# Patient Record
Sex: Female | Born: 1935 | Race: White | Hispanic: No | Marital: Single | Smoking: Never smoker
Health system: Southern US, Community
[De-identification: ages and names within clinical notes are randomized; demographics above are authoritative.]

## PROBLEM LIST (undated history)

## (undated) DIAGNOSIS — R05 Cough: Secondary | ICD-10-CM

## (undated) DIAGNOSIS — Z8489 Family history of other specified conditions: Secondary | ICD-10-CM

## (undated) DIAGNOSIS — M1990 Unspecified osteoarthritis, unspecified site: Secondary | ICD-10-CM

## (undated) DIAGNOSIS — R739 Hyperglycemia, unspecified: Secondary | ICD-10-CM

## (undated) DIAGNOSIS — R319 Hematuria, unspecified: Secondary | ICD-10-CM

## (undated) DIAGNOSIS — IMO0002 Reserved for concepts with insufficient information to code with codable children: Secondary | ICD-10-CM

## (undated) DIAGNOSIS — K219 Gastro-esophageal reflux disease without esophagitis: Secondary | ICD-10-CM

## (undated) DIAGNOSIS — K5790 Diverticulosis of intestine, part unspecified, without perforation or abscess without bleeding: Secondary | ICD-10-CM

## (undated) DIAGNOSIS — C541 Malignant neoplasm of endometrium: Secondary | ICD-10-CM

## (undated) DIAGNOSIS — I10 Essential (primary) hypertension: Secondary | ICD-10-CM

## (undated) DIAGNOSIS — R32 Unspecified urinary incontinence: Secondary | ICD-10-CM

## (undated) DIAGNOSIS — C55 Malignant neoplasm of uterus, part unspecified: Secondary | ICD-10-CM

## (undated) DIAGNOSIS — C44519 Basal cell carcinoma of skin of other part of trunk: Secondary | ICD-10-CM

## (undated) DIAGNOSIS — R053 Chronic cough: Secondary | ICD-10-CM

## (undated) DIAGNOSIS — C50912 Malignant neoplasm of unspecified site of left female breast: Secondary | ICD-10-CM

## (undated) HISTORY — DX: Malignant neoplasm of endometrium: C54.1

## (undated) HISTORY — DX: Chronic cough: R05.3

## (undated) HISTORY — PX: TOTAL ABDOMINAL HYSTERECTOMY: SHX209

## (undated) HISTORY — PX: DILATION AND CURETTAGE OF UTERUS: SHX78

## (undated) HISTORY — DX: Cough: R05

## (undated) HISTORY — PX: CATARACT EXTRACTION W/ INTRAOCULAR LENS  IMPLANT, BILATERAL: SHX1307

## (undated) HISTORY — PX: CARPAL TUNNEL RELEASE: SHX101

## (undated) HISTORY — DX: Hyperglycemia, unspecified: R73.9

## (undated) HISTORY — DX: Reserved for concepts with insufficient information to code with codable children: IMO0002

## (undated) HISTORY — DX: Gastro-esophageal reflux disease without esophagitis: K21.9

## (undated) HISTORY — PX: TOTAL ABDOMINAL HYSTERECTOMY W/ BILATERAL SALPINGOOPHORECTOMY: SHX83

## (undated) HISTORY — PX: MASTECTOMY: SHX3

## (undated) HISTORY — DX: Hematuria, unspecified: R31.9

## (undated) HISTORY — PX: SHOULDER SURGERY: SHX246

## (undated) HISTORY — DX: Diverticulosis of intestine, part unspecified, without perforation or abscess without bleeding: K57.90

## (undated) HISTORY — DX: Essential (primary) hypertension: I10

---

## 1998-04-14 ENCOUNTER — Ambulatory Visit (HOSPITAL_COMMUNITY): Admission: RE | Admit: 1998-04-14 | Discharge: 1998-04-14 | Payer: Self-pay | Admitting: Gastroenterology

## 1998-04-30 ENCOUNTER — Ambulatory Visit (HOSPITAL_COMMUNITY): Admission: RE | Admit: 1998-04-30 | Discharge: 1998-04-30 | Payer: Self-pay | Admitting: Gastroenterology

## 1998-08-13 DIAGNOSIS — C50912 Malignant neoplasm of unspecified site of left female breast: Secondary | ICD-10-CM

## 1998-08-13 HISTORY — DX: Malignant neoplasm of unspecified site of left female breast: C50.912

## 1998-08-22 ENCOUNTER — Ambulatory Visit (HOSPITAL_COMMUNITY): Admission: RE | Admit: 1998-08-22 | Discharge: 1998-08-22 | Payer: Self-pay | Admitting: *Deleted

## 1998-09-02 ENCOUNTER — Inpatient Hospital Stay (HOSPITAL_COMMUNITY): Admission: RE | Admit: 1998-09-02 | Discharge: 1998-09-03 | Payer: Self-pay | Admitting: *Deleted

## 1998-10-13 ENCOUNTER — Other Ambulatory Visit: Admission: RE | Admit: 1998-10-13 | Discharge: 1998-10-13 | Payer: Self-pay | Admitting: Obstetrics and Gynecology

## 1998-10-14 HISTORY — PX: OTHER SURGICAL HISTORY: SHX169

## 1999-10-15 ENCOUNTER — Encounter: Payer: Self-pay | Admitting: Family Medicine

## 1999-10-15 ENCOUNTER — Other Ambulatory Visit: Admission: RE | Admit: 1999-10-15 | Discharge: 1999-10-15 | Payer: Self-pay | Admitting: Obstetrics and Gynecology

## 1999-10-15 LAB — CONVERTED CEMR LAB: Pap Smear: NORMAL

## 1999-10-15 LAB — FECAL OCCULT BLOOD, GUAIAC: Fecal Occult Blood: NEGATIVE

## 2000-03-29 ENCOUNTER — Encounter: Payer: Self-pay | Admitting: Family Medicine

## 2000-03-29 ENCOUNTER — Encounter: Admission: RE | Admit: 2000-03-29 | Discharge: 2000-03-29 | Payer: Self-pay | Admitting: Family Medicine

## 2000-10-14 HISTORY — PX: OTHER SURGICAL HISTORY: SHX169

## 2000-10-25 ENCOUNTER — Other Ambulatory Visit: Admission: RE | Admit: 2000-10-25 | Discharge: 2000-10-25 | Payer: Self-pay | Admitting: Obstetrics and Gynecology

## 2000-11-11 HISTORY — PX: COLONOSCOPY: SHX174

## 2001-10-31 ENCOUNTER — Other Ambulatory Visit: Admission: RE | Admit: 2001-10-31 | Discharge: 2001-10-31 | Payer: Self-pay | Admitting: Obstetrics and Gynecology

## 2002-11-01 ENCOUNTER — Other Ambulatory Visit: Admission: RE | Admit: 2002-11-01 | Discharge: 2002-11-01 | Payer: Self-pay | Admitting: Obstetrics and Gynecology

## 2002-11-12 HISTORY — PX: OTHER SURGICAL HISTORY: SHX169

## 2002-12-02 ENCOUNTER — Encounter: Payer: Self-pay | Admitting: Emergency Medicine

## 2002-12-02 ENCOUNTER — Emergency Department (HOSPITAL_COMMUNITY): Admission: EM | Admit: 2002-12-02 | Discharge: 2002-12-02 | Payer: Self-pay | Admitting: Emergency Medicine

## 2003-11-07 ENCOUNTER — Other Ambulatory Visit: Admission: RE | Admit: 2003-11-07 | Discharge: 2003-11-07 | Payer: Self-pay | Admitting: Obstetrics and Gynecology

## 2004-08-20 ENCOUNTER — Emergency Department (HOSPITAL_COMMUNITY): Admission: EM | Admit: 2004-08-20 | Discharge: 2004-08-20 | Payer: Self-pay | Admitting: Emergency Medicine

## 2004-08-24 ENCOUNTER — Ambulatory Visit: Payer: Self-pay | Admitting: Family Medicine

## 2004-09-11 ENCOUNTER — Ambulatory Visit: Payer: Self-pay | Admitting: Family Medicine

## 2004-09-29 ENCOUNTER — Emergency Department (HOSPITAL_COMMUNITY): Admission: EM | Admit: 2004-09-29 | Discharge: 2004-09-29 | Payer: Self-pay | Admitting: *Deleted

## 2004-09-29 ENCOUNTER — Ambulatory Visit: Payer: Self-pay | Admitting: Family Medicine

## 2004-10-01 ENCOUNTER — Ambulatory Visit: Payer: Self-pay | Admitting: Family Medicine

## 2004-10-05 ENCOUNTER — Ambulatory Visit: Payer: Self-pay | Admitting: Internal Medicine

## 2004-10-07 ENCOUNTER — Ambulatory Visit: Payer: Self-pay | Admitting: Family Medicine

## 2004-10-14 ENCOUNTER — Ambulatory Visit: Payer: Self-pay | Admitting: Family Medicine

## 2004-10-16 ENCOUNTER — Ambulatory Visit: Payer: Self-pay | Admitting: Internal Medicine

## 2004-10-28 ENCOUNTER — Ambulatory Visit: Payer: Self-pay | Admitting: Oncology

## 2004-11-02 ENCOUNTER — Ambulatory Visit: Payer: Self-pay | Admitting: Family Medicine

## 2004-11-09 ENCOUNTER — Other Ambulatory Visit: Admission: RE | Admit: 2004-11-09 | Discharge: 2004-11-09 | Payer: Self-pay | Admitting: *Deleted

## 2004-11-11 ENCOUNTER — Ambulatory Visit: Payer: Self-pay | Admitting: Family Medicine

## 2004-11-20 ENCOUNTER — Ambulatory Visit: Payer: Self-pay | Admitting: Internal Medicine

## 2004-12-09 ENCOUNTER — Ambulatory Visit: Payer: Self-pay | Admitting: Family Medicine

## 2004-12-28 ENCOUNTER — Ambulatory Visit: Payer: Self-pay | Admitting: Family Medicine

## 2005-01-11 ENCOUNTER — Ambulatory Visit: Payer: Self-pay | Admitting: Family Medicine

## 2005-03-03 ENCOUNTER — Ambulatory Visit: Payer: Self-pay | Admitting: Family Medicine

## 2005-04-12 ENCOUNTER — Ambulatory Visit: Payer: Self-pay | Admitting: Family Medicine

## 2005-05-26 ENCOUNTER — Ambulatory Visit: Payer: Self-pay | Admitting: Family Medicine

## 2005-06-30 ENCOUNTER — Ambulatory Visit: Payer: Self-pay | Admitting: Family Medicine

## 2005-09-13 HISTORY — PX: BACK SURGERY: SHX140

## 2005-09-22 ENCOUNTER — Ambulatory Visit: Payer: Self-pay | Admitting: Family Medicine

## 2005-09-28 ENCOUNTER — Encounter: Admission: RE | Admit: 2005-09-28 | Discharge: 2005-09-28 | Payer: Self-pay | Admitting: Family Medicine

## 2005-11-05 ENCOUNTER — Ambulatory Visit: Payer: Self-pay | Admitting: Oncology

## 2005-11-15 ENCOUNTER — Ambulatory Visit: Payer: Self-pay | Admitting: Family Medicine

## 2005-12-09 ENCOUNTER — Other Ambulatory Visit: Admission: RE | Admit: 2005-12-09 | Discharge: 2005-12-09 | Payer: Self-pay | Admitting: Obstetrics & Gynecology

## 2005-12-29 ENCOUNTER — Emergency Department (HOSPITAL_COMMUNITY): Admission: EM | Admit: 2005-12-29 | Discharge: 2005-12-30 | Payer: Self-pay | Admitting: Emergency Medicine

## 2005-12-31 ENCOUNTER — Ambulatory Visit: Payer: Self-pay | Admitting: Family Medicine

## 2006-01-21 ENCOUNTER — Ambulatory Visit: Payer: Self-pay | Admitting: Family Medicine

## 2006-03-31 ENCOUNTER — Ambulatory Visit: Payer: Self-pay | Admitting: Family Medicine

## 2006-05-01 ENCOUNTER — Encounter: Admission: RE | Admit: 2006-05-01 | Discharge: 2006-05-01 | Payer: Self-pay | Admitting: Family Medicine

## 2006-05-09 ENCOUNTER — Ambulatory Visit (HOSPITAL_COMMUNITY): Admission: RE | Admit: 2006-05-09 | Discharge: 2006-05-10 | Payer: Self-pay | Admitting: Orthopaedic Surgery

## 2006-06-22 ENCOUNTER — Ambulatory Visit: Payer: Self-pay | Admitting: Family Medicine

## 2006-07-07 ENCOUNTER — Ambulatory Visit: Payer: Self-pay | Admitting: Family Medicine

## 2006-07-26 ENCOUNTER — Ambulatory Visit: Payer: Self-pay | Admitting: Family Medicine

## 2006-09-27 ENCOUNTER — Ambulatory Visit: Payer: Self-pay | Admitting: Family Medicine

## 2006-10-12 ENCOUNTER — Ambulatory Visit: Payer: Self-pay | Admitting: Pulmonary Disease

## 2006-10-26 ENCOUNTER — Ambulatory Visit: Payer: Self-pay | Admitting: Family Medicine

## 2006-11-08 ENCOUNTER — Emergency Department (HOSPITAL_COMMUNITY): Admission: EM | Admit: 2006-11-08 | Discharge: 2006-11-08 | Payer: Self-pay | Admitting: Emergency Medicine

## 2006-11-09 ENCOUNTER — Ambulatory Visit: Payer: Self-pay | Admitting: Oncology

## 2006-11-14 LAB — COMPREHENSIVE METABOLIC PANEL
ALT: 15 U/L (ref 0–35)
Alkaline Phosphatase: 52 U/L (ref 39–117)
Potassium: 3.8 mEq/L (ref 3.5–5.3)
Sodium: 140 mEq/L (ref 135–145)
Total Bilirubin: 0.4 mg/dL (ref 0.3–1.2)
Total Protein: 7 g/dL (ref 6.0–8.3)

## 2006-11-14 LAB — CBC WITH DIFFERENTIAL/PLATELET
BASO%: 0.1 % (ref 0.0–2.0)
LYMPH%: 19.6 % (ref 14.0–48.0)
MCHC: 34.1 g/dL (ref 32.0–36.0)
MCV: 91.2 fL (ref 81.0–101.0)
MONO#: 1.1 10*3/uL — ABNORMAL HIGH (ref 0.1–0.9)
MONO%: 15.3 % — ABNORMAL HIGH (ref 0.0–13.0)
Platelets: 264 10*3/uL (ref 145–400)
RBC: 4.08 10*6/uL (ref 3.70–5.32)
RDW: 13.6 % (ref 11.3–14.5)
WBC: 7.2 10*3/uL (ref 3.9–10.0)

## 2006-11-18 ENCOUNTER — Ambulatory Visit: Payer: Self-pay | Admitting: Family Medicine

## 2006-11-22 ENCOUNTER — Ambulatory Visit (HOSPITAL_BASED_OUTPATIENT_CLINIC_OR_DEPARTMENT_OTHER): Admission: RE | Admit: 2006-11-22 | Discharge: 2006-11-22 | Payer: Self-pay | Admitting: Pulmonary Disease

## 2006-11-29 ENCOUNTER — Encounter: Payer: Self-pay | Admitting: Family Medicine

## 2006-11-29 DIAGNOSIS — K219 Gastro-esophageal reflux disease without esophagitis: Secondary | ICD-10-CM | POA: Insufficient documentation

## 2006-11-29 DIAGNOSIS — I10 Essential (primary) hypertension: Secondary | ICD-10-CM | POA: Insufficient documentation

## 2006-12-09 ENCOUNTER — Ambulatory Visit: Payer: Self-pay | Admitting: Pulmonary Disease

## 2006-12-11 DIAGNOSIS — Z853 Personal history of malignant neoplasm of breast: Secondary | ICD-10-CM | POA: Insufficient documentation

## 2006-12-14 ENCOUNTER — Other Ambulatory Visit: Admission: RE | Admit: 2006-12-14 | Discharge: 2006-12-14 | Payer: Self-pay | Admitting: Obstetrics & Gynecology

## 2006-12-15 DIAGNOSIS — M1990 Unspecified osteoarthritis, unspecified site: Secondary | ICD-10-CM | POA: Insufficient documentation

## 2006-12-21 ENCOUNTER — Ambulatory Visit: Payer: Self-pay | Admitting: Pulmonary Disease

## 2006-12-28 ENCOUNTER — Emergency Department (HOSPITAL_COMMUNITY): Admission: EM | Admit: 2006-12-28 | Discharge: 2006-12-29 | Payer: Self-pay | Admitting: Emergency Medicine

## 2007-01-18 ENCOUNTER — Ambulatory Visit: Payer: Self-pay | Admitting: Pulmonary Disease

## 2007-05-05 ENCOUNTER — Ambulatory Visit: Payer: Self-pay | Admitting: Family Medicine

## 2007-05-05 DIAGNOSIS — G2581 Restless legs syndrome: Secondary | ICD-10-CM

## 2007-05-05 DIAGNOSIS — L821 Other seborrheic keratosis: Secondary | ICD-10-CM | POA: Insufficient documentation

## 2007-05-08 LAB — CONVERTED CEMR LAB
Albumin: 4.4 g/dL (ref 3.5–5.2)
Basophils Relative: 0 % (ref 0–1)
Eosinophils Absolute: 0.1 10*3/uL (ref 0.0–0.7)
Eosinophils Relative: 2 % (ref 0–5)
Ferritin: 86 ng/mL (ref 10–291)
Hemoglobin: 13.4 g/dL (ref 12.0–15.0)
Lymphs Abs: 1.3 10*3/uL (ref 0.7–3.3)
MCV: 92.4 fL (ref 78.0–100.0)
Monocytes Absolute: 0.9 10*3/uL — ABNORMAL HIGH (ref 0.2–0.7)
Phosphorus: 4.2 mg/dL (ref 2.3–4.6)
Potassium: 3.9 meq/L (ref 3.5–5.3)
RBC: 4.33 M/uL (ref 3.87–5.11)
RDW: 13.8 % (ref 11.5–14.0)
Sodium: 140 meq/L (ref 135–145)
TSH: 1.94 microintl units/mL (ref 0.350–5.50)
WBC: 5.5 10*3/uL (ref 4.0–10.5)

## 2007-05-16 ENCOUNTER — Ambulatory Visit: Payer: Self-pay | Admitting: Family Medicine

## 2007-06-04 IMAGING — CR DG ABDOMEN ACUTE W/ 1V CHEST
3 series · 3 of 3 positions shown · non-contrast
Comparison: none

CLINICAL DATA: Abdominal pain, nausea, hypertension

[w chest pa]
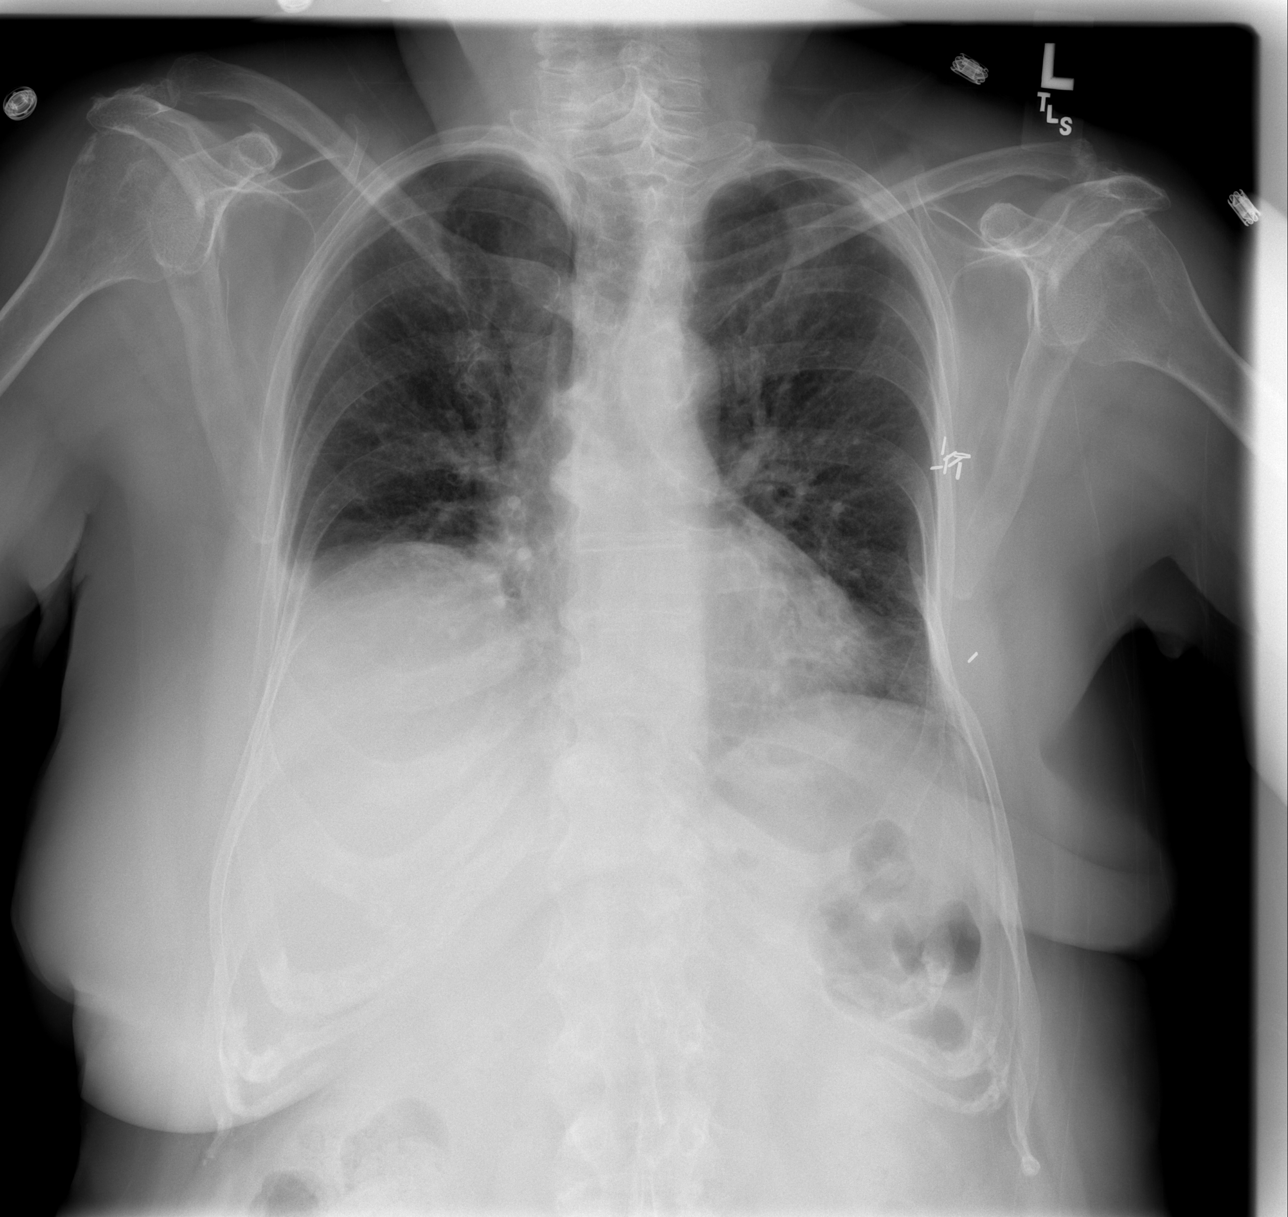

[w abdomen upright]
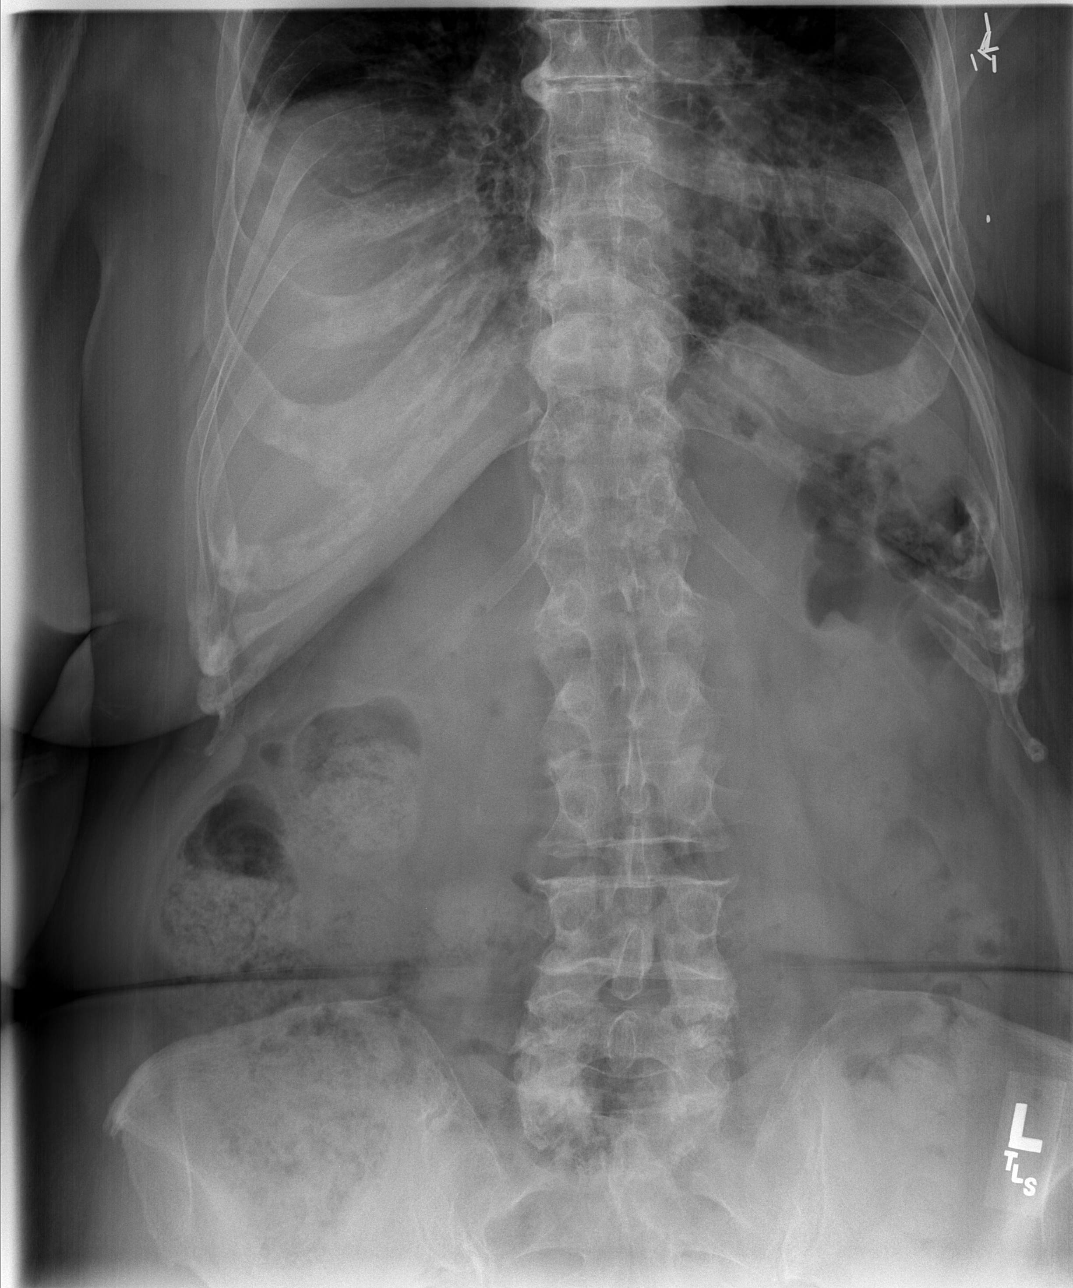

[t abdomen supine]
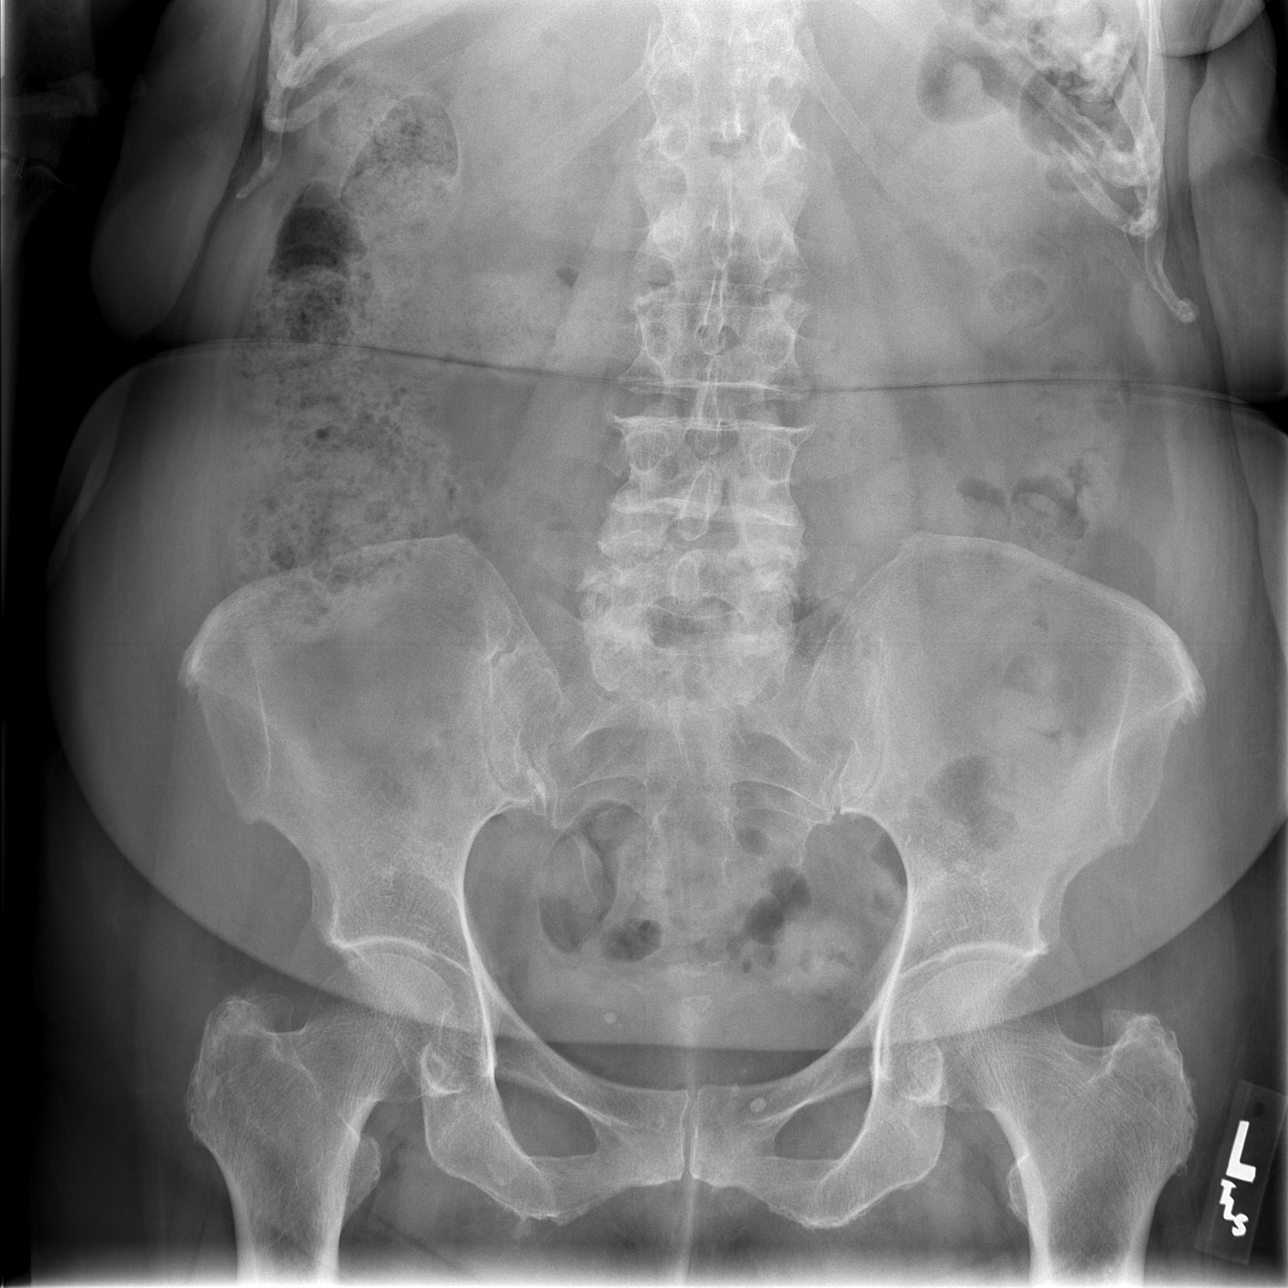

[3 of 3 positions shown; findings below may reference images not displayed]

Acute abdomen with chest:

Comparison 12/30/2005. Elevated right diaphragmatic leaflet. Coarse interstitial
and airspace opacities in a predominantly perihilar distribution, slightly more
prominent than on previous exam. Vascular clips left axilla.
Supine and erect abdomen films show no free air. Paucity of small bowel gas.
Moderate fecal material in the nondilated colon. Degenerative changes in the
lumbar spine. Bilateral pelvic phleboliths.
IMPRESSION: 1. Nonobstructive bowel gas pattern with moderate colonic fecal material.
2. Some increase in bilateral pulmonary interstitial infiltrates or edema

## 2007-06-14 HISTORY — PX: BASAL CELL CARCINOMA EXCISION: SHX1214

## 2007-06-15 ENCOUNTER — Ambulatory Visit: Payer: Self-pay | Admitting: Family Medicine

## 2007-06-28 ENCOUNTER — Encounter: Payer: Self-pay | Admitting: Family Medicine

## 2007-08-04 ENCOUNTER — Ambulatory Visit: Payer: Self-pay | Admitting: Pulmonary Disease

## 2007-09-01 ENCOUNTER — Observation Stay (HOSPITAL_COMMUNITY): Admission: EM | Admit: 2007-09-01 | Discharge: 2007-09-02 | Payer: Self-pay | Admitting: Emergency Medicine

## 2007-09-01 ENCOUNTER — Ambulatory Visit: Payer: Self-pay | Admitting: Internal Medicine

## 2007-09-02 ENCOUNTER — Encounter: Payer: Self-pay | Admitting: Family Medicine

## 2007-09-06 ENCOUNTER — Ambulatory Visit: Payer: Self-pay | Admitting: Family Medicine

## 2007-09-06 DIAGNOSIS — K8020 Calculus of gallbladder without cholecystitis without obstruction: Secondary | ICD-10-CM | POA: Insufficient documentation

## 2007-09-11 LAB — CONVERTED CEMR LAB
Albumin: 4.1 g/dL (ref 3.5–5.2)
Basophils Relative: 0.5 % (ref 0.0–1.0)
Bilirubin, Direct: 0.2 mg/dL (ref 0.0–0.3)
CO2: 33 meq/L — ABNORMAL HIGH (ref 19–32)
Chloride: 97 meq/L (ref 96–112)
Eosinophils Absolute: 0.1 10*3/uL (ref 0.0–0.6)
Eosinophils Relative: 0.9 % (ref 0.0–5.0)
GFR calc non Af Amer: 86 mL/min
Glucose, Bld: 91 mg/dL (ref 70–99)
HCT: 41.5 % (ref 36.0–46.0)
Lymphocytes Relative: 12.7 % (ref 12.0–46.0)
Neutro Abs: 4.8 10*3/uL (ref 1.4–7.7)
Neutrophils Relative %: 72.2 % (ref 43.0–77.0)
Potassium: 4.1 meq/L (ref 3.5–5.1)
Sodium: 140 meq/L (ref 135–145)
Total Bilirubin: 0.6 mg/dL (ref 0.3–1.2)
Total Protein: 7.2 g/dL (ref 6.0–8.3)

## 2007-09-14 HISTORY — PX: LAPAROSCOPIC CHOLECYSTECTOMY: SUR755

## 2007-09-20 ENCOUNTER — Ambulatory Visit: Payer: Self-pay | Admitting: Internal Medicine

## 2007-09-21 ENCOUNTER — Encounter: Payer: Self-pay | Admitting: Family Medicine

## 2007-09-26 ENCOUNTER — Encounter: Payer: Self-pay | Admitting: Family Medicine

## 2007-09-29 ENCOUNTER — Encounter: Payer: Self-pay | Admitting: Family Medicine

## 2007-10-19 ENCOUNTER — Ambulatory Visit (HOSPITAL_COMMUNITY): Admission: RE | Admit: 2007-10-19 | Discharge: 2007-10-20 | Payer: Self-pay | Admitting: General Surgery

## 2007-10-19 ENCOUNTER — Encounter (INDEPENDENT_AMBULATORY_CARE_PROVIDER_SITE_OTHER): Payer: Self-pay | Admitting: General Surgery

## 2007-10-19 ENCOUNTER — Encounter: Payer: Self-pay | Admitting: Family Medicine

## 2007-10-23 ENCOUNTER — Ambulatory Visit: Payer: Self-pay | Admitting: Family Medicine

## 2007-10-24 LAB — CONVERTED CEMR LAB
Cholesterol: 150 mg/dL (ref 0–200)
Hgb A1c MFr Bld: 6.5 % — ABNORMAL HIGH (ref 4.6–6.0)
LDL Cholesterol: 75 mg/dL (ref 0–99)
Triglycerides: 127 mg/dL (ref 0–149)

## 2007-11-09 ENCOUNTER — Ambulatory Visit: Payer: Self-pay | Admitting: Oncology

## 2007-11-15 ENCOUNTER — Encounter: Payer: Self-pay | Admitting: Family Medicine

## 2007-11-17 ENCOUNTER — Encounter: Payer: Self-pay | Admitting: Family Medicine

## 2007-11-17 LAB — CBC WITH DIFFERENTIAL/PLATELET
Basophils Absolute: 0 10*3/uL (ref 0.0–0.1)
Eosinophils Absolute: 0.1 10*3/uL (ref 0.0–0.5)
HGB: 13.1 g/dL (ref 11.6–15.9)
MONO#: 0.8 10*3/uL (ref 0.1–0.9)
MONO%: 13.4 % — ABNORMAL HIGH (ref 0.0–13.0)
NEUT#: 3.7 10*3/uL (ref 1.5–6.5)
RBC: 4.16 10*6/uL (ref 3.70–5.32)
RDW: 14.1 % (ref 11.3–14.5)
WBC: 6.1 10*3/uL (ref 3.9–10.0)
lymph#: 1.5 10*3/uL (ref 0.9–3.3)

## 2007-11-17 LAB — COMPREHENSIVE METABOLIC PANEL
Albumin: 4.1 g/dL (ref 3.5–5.2)
Alkaline Phosphatase: 57 U/L (ref 39–117)
BUN: 16 mg/dL (ref 6–23)
CO2: 26 mEq/L (ref 19–32)
Calcium: 9 mg/dL (ref 8.4–10.5)
Chloride: 102 mEq/L (ref 96–112)
Glucose, Bld: 108 mg/dL — ABNORMAL HIGH (ref 70–99)
Potassium: 3.9 mEq/L (ref 3.5–5.3)
Sodium: 139 mEq/L (ref 135–145)
Total Protein: 6.7 g/dL (ref 6.0–8.3)

## 2007-11-20 ENCOUNTER — Encounter: Payer: Self-pay | Admitting: Family Medicine

## 2007-12-27 ENCOUNTER — Other Ambulatory Visit: Admission: RE | Admit: 2007-12-27 | Discharge: 2007-12-27 | Payer: Self-pay | Admitting: Obstetrics & Gynecology

## 2008-01-05 ENCOUNTER — Encounter: Payer: Self-pay | Admitting: Family Medicine

## 2008-03-18 ENCOUNTER — Encounter: Payer: Self-pay | Admitting: Family Medicine

## 2008-03-25 IMAGING — RF DG CHOLANGIOGRAM OPERATIVE
1 series · 4 of 4 positions shown · non-contrast
Comparison: none

CLINICAL DATA: Cholelithiasis

[Series 1: run · 4 of 160 frames shown]
[frame 25/160]
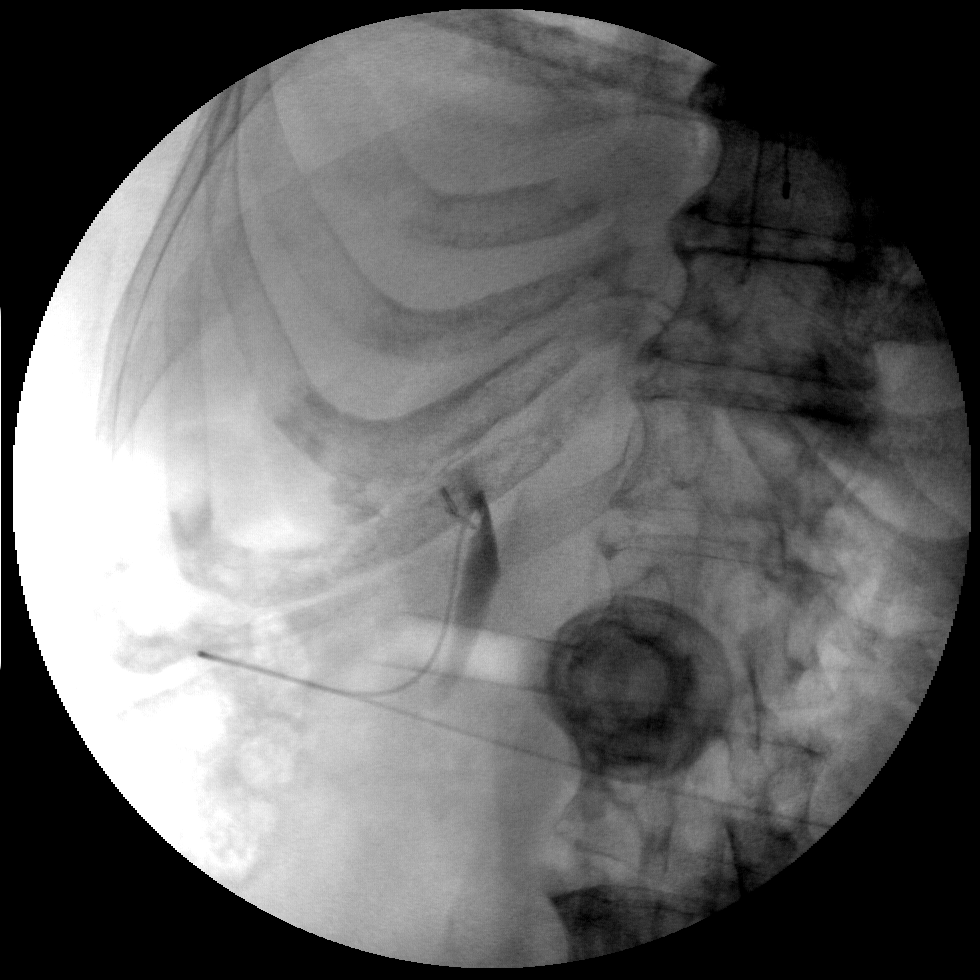
[frame 81/160]
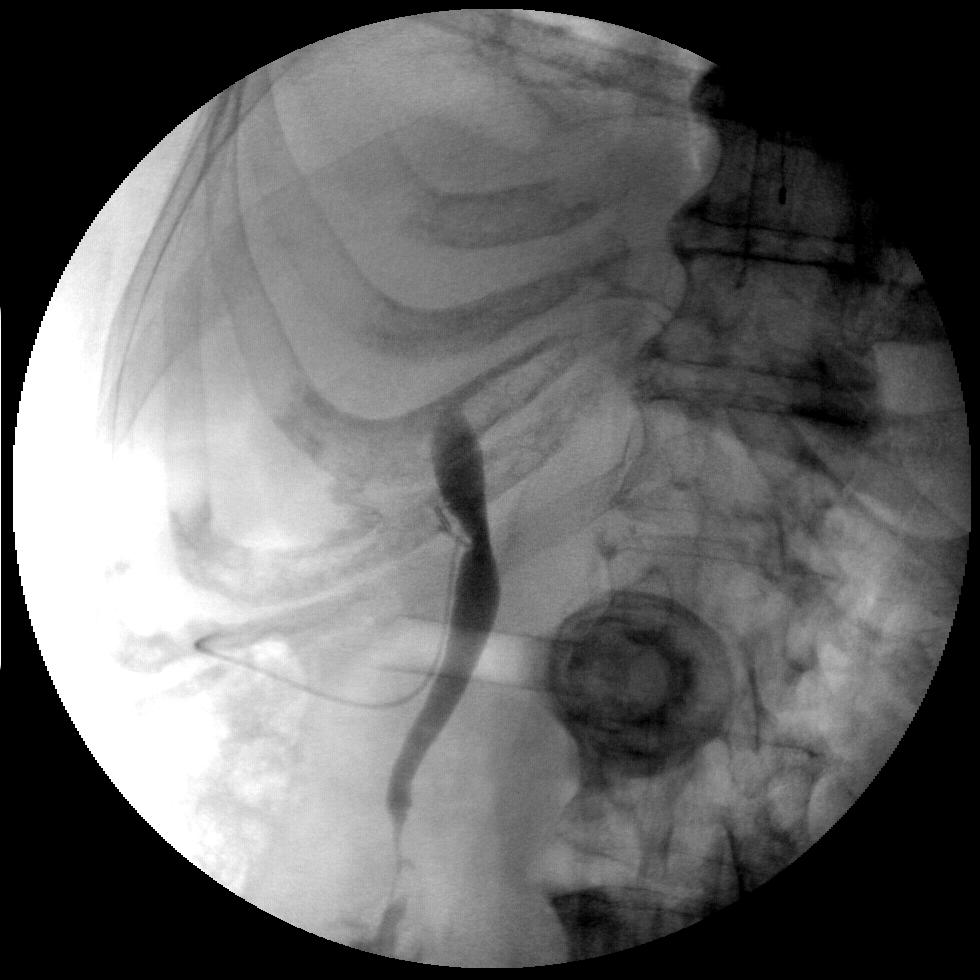
[frame 114/160]
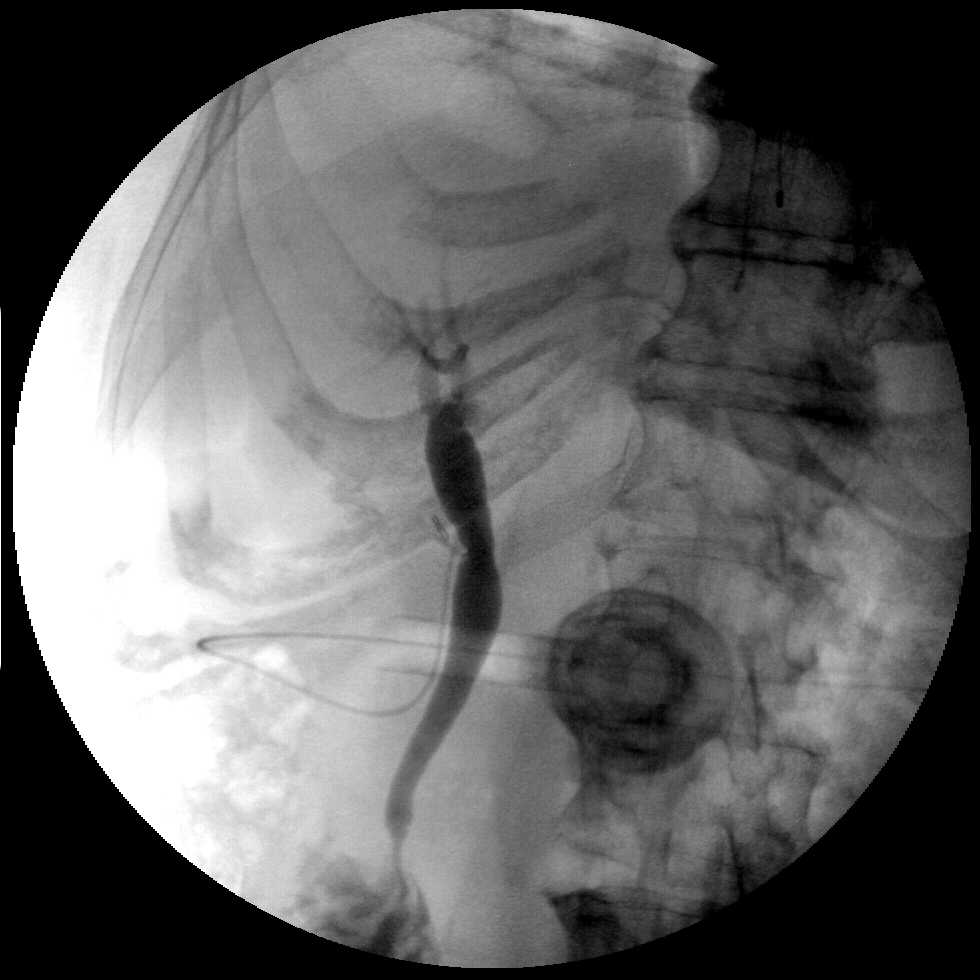
[frame 137/160]
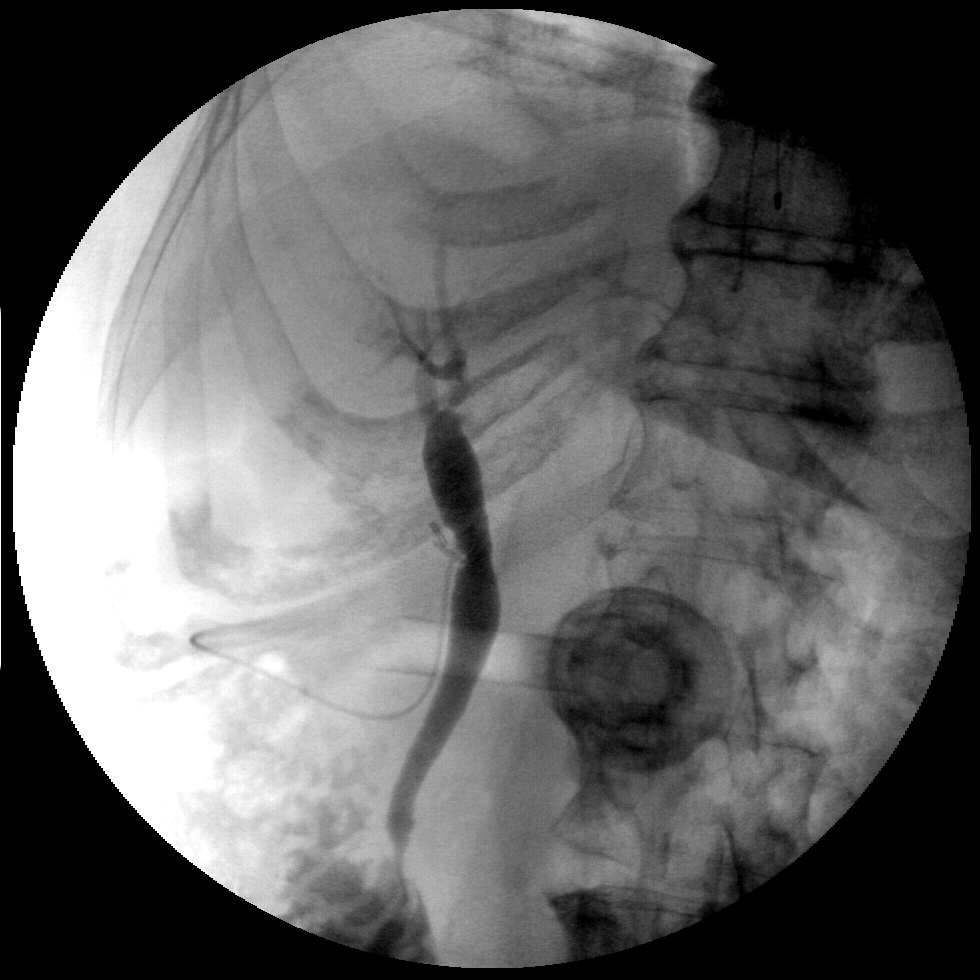

[4 of 4 positions shown; findings below may reference images not displayed]

INTRAOPERATIVE CHOLANGIOGRAM:

160  images from intraoperative C-arm fluoroscopy demonstrate  opacification of
the common bile duct. No filling defects to suggest retained stones. There is
incomplete evaluation of intrahepatic biliary tree, which appears decompressed
centrally. Contrast appears to flow on into decompressed duodenum.
IMPRESSION: 1. Negative for retained common duct stone

## 2008-04-22 ENCOUNTER — Ambulatory Visit: Payer: Self-pay | Admitting: Family Medicine

## 2008-04-22 DIAGNOSIS — J309 Allergic rhinitis, unspecified: Secondary | ICD-10-CM | POA: Insufficient documentation

## 2008-04-22 DIAGNOSIS — M810 Age-related osteoporosis without current pathological fracture: Secondary | ICD-10-CM | POA: Insufficient documentation

## 2008-04-23 LAB — CONVERTED CEMR LAB
ALT: 18 units/L (ref 0–35)
Albumin: 4 g/dL (ref 3.5–5.2)
Calcium: 9.2 mg/dL (ref 8.4–10.5)
Cholesterol: 161 mg/dL (ref 0–200)
GFR calc Af Amer: 89 mL/min
GFR calc non Af Amer: 74 mL/min
Glucose, Bld: 103 mg/dL — ABNORMAL HIGH (ref 70–99)
Hgb A1c MFr Bld: 6.4 % — ABNORMAL HIGH (ref 4.6–6.0)
LDL Cholesterol: 82 mg/dL (ref 0–99)
Phosphorus: 4.3 mg/dL (ref 2.3–4.6)
Potassium: 4.6 meq/L (ref 3.5–5.1)
Triglycerides: 136 mg/dL (ref 0–149)

## 2008-06-12 ENCOUNTER — Ambulatory Visit: Payer: Self-pay | Admitting: Family Medicine

## 2008-08-22 ENCOUNTER — Ambulatory Visit: Payer: Self-pay | Admitting: Pulmonary Disease

## 2008-09-24 ENCOUNTER — Encounter: Payer: Self-pay | Admitting: Family Medicine

## 2008-10-08 ENCOUNTER — Encounter (INDEPENDENT_AMBULATORY_CARE_PROVIDER_SITE_OTHER): Payer: Self-pay | Admitting: *Deleted

## 2008-10-16 ENCOUNTER — Ambulatory Visit: Payer: Self-pay | Admitting: Family Medicine

## 2008-10-17 LAB — CONVERTED CEMR LAB
AST: 26 units/L (ref 0–37)
Albumin: 4.1 g/dL (ref 3.5–5.2)
Basophils Absolute: 0 10*3/uL (ref 0.0–0.1)
Basophils Relative: 0.1 % (ref 0.0–3.0)
Creatinine,U: 88 mg/dL
Eosinophils Relative: 1.5 % (ref 0.0–5.0)
Glucose, Bld: 90 mg/dL (ref 70–99)
HCT: 42.3 % (ref 36.0–46.0)
Hemoglobin: 14.7 g/dL (ref 12.0–15.0)
Hgb A1c MFr Bld: 6.4 % — ABNORMAL HIGH (ref 4.6–6.0)
LDL Cholesterol: 91 mg/dL (ref 0–99)
Lymphocytes Relative: 18.3 % (ref 12.0–46.0)
Microalb, Ur: 0.9 mg/dL (ref 0.0–1.9)
Monocytes Absolute: 0.7 10*3/uL (ref 0.1–1.0)
Monocytes Relative: 13.1 % — ABNORMAL HIGH (ref 3.0–12.0)
Neutro Abs: 3.7 10*3/uL (ref 1.4–7.7)
Phosphorus: 4.2 mg/dL (ref 2.3–4.6)
Potassium: 3.7 meq/L (ref 3.5–5.1)
RBC: 4.57 M/uL (ref 3.87–5.11)
Total CHOL/HDL Ratio: 2.9
Triglycerides: 93 mg/dL (ref 0–149)
WBC: 5.5 10*3/uL (ref 4.5–10.5)

## 2008-10-21 ENCOUNTER — Ambulatory Visit: Payer: Self-pay | Admitting: Family Medicine

## 2008-11-28 ENCOUNTER — Telehealth: Payer: Self-pay | Admitting: Family Medicine

## 2008-12-06 ENCOUNTER — Ambulatory Visit: Payer: Self-pay | Admitting: Family Medicine

## 2008-12-06 DIAGNOSIS — N3946 Mixed incontinence: Secondary | ICD-10-CM | POA: Insufficient documentation

## 2008-12-06 DIAGNOSIS — R3129 Other microscopic hematuria: Secondary | ICD-10-CM | POA: Insufficient documentation

## 2008-12-07 ENCOUNTER — Encounter: Payer: Self-pay | Admitting: Family Medicine

## 2008-12-11 ENCOUNTER — Ambulatory Visit: Payer: Self-pay | Admitting: Family Medicine

## 2008-12-11 LAB — CONVERTED CEMR LAB
Glucose, Urine, Semiquant: NEGATIVE
Ketones, urine, test strip: NEGATIVE
Nitrite: NEGATIVE
Protein, U semiquant: NEGATIVE
Specific Gravity, Urine: 1.01
pH: 6

## 2008-12-12 ENCOUNTER — Telehealth: Payer: Self-pay | Admitting: Family Medicine

## 2008-12-23 ENCOUNTER — Telehealth (INDEPENDENT_AMBULATORY_CARE_PROVIDER_SITE_OTHER): Payer: Self-pay | Admitting: *Deleted

## 2008-12-25 ENCOUNTER — Encounter: Payer: Self-pay | Admitting: Family Medicine

## 2008-12-26 ENCOUNTER — Telehealth: Payer: Self-pay | Admitting: Family Medicine

## 2008-12-27 ENCOUNTER — Other Ambulatory Visit: Admission: RE | Admit: 2008-12-27 | Discharge: 2008-12-27 | Payer: Self-pay | Admitting: Obstetrics & Gynecology

## 2009-01-09 ENCOUNTER — Encounter: Payer: Self-pay | Admitting: Family Medicine

## 2009-01-22 ENCOUNTER — Telehealth: Payer: Self-pay | Admitting: Family Medicine

## 2009-01-23 ENCOUNTER — Encounter: Payer: Self-pay | Admitting: Family Medicine

## 2009-01-23 LAB — HM DIABETES EYE EXAM: HM Diabetic Eye Exam: NORMAL

## 2009-02-12 ENCOUNTER — Encounter: Payer: Self-pay | Admitting: Family Medicine

## 2009-03-03 ENCOUNTER — Encounter: Payer: Self-pay | Admitting: Family Medicine

## 2009-04-22 ENCOUNTER — Ambulatory Visit: Payer: Self-pay | Admitting: Family Medicine

## 2009-04-25 LAB — CONVERTED CEMR LAB
CO2: 32 meq/L (ref 19–32)
Chloride: 98 meq/L (ref 96–112)
Hgb A1c MFr Bld: 6.3 % (ref 4.6–6.5)
Potassium: 3.5 meq/L (ref 3.5–5.1)

## 2009-05-02 ENCOUNTER — Ambulatory Visit: Payer: Self-pay | Admitting: Family Medicine

## 2009-05-21 ENCOUNTER — Encounter: Payer: Self-pay | Admitting: Family Medicine

## 2009-06-11 ENCOUNTER — Ambulatory Visit: Payer: Self-pay | Admitting: Family Medicine

## 2009-09-01 ENCOUNTER — Telehealth: Payer: Self-pay | Admitting: Family Medicine

## 2009-09-17 ENCOUNTER — Ambulatory Visit: Payer: Self-pay | Admitting: Family Medicine

## 2009-09-18 ENCOUNTER — Encounter (INDEPENDENT_AMBULATORY_CARE_PROVIDER_SITE_OTHER): Payer: Self-pay | Admitting: Internal Medicine

## 2009-09-18 ENCOUNTER — Telehealth (INDEPENDENT_AMBULATORY_CARE_PROVIDER_SITE_OTHER): Payer: Self-pay | Admitting: Internal Medicine

## 2009-09-24 ENCOUNTER — Ambulatory Visit: Payer: Self-pay | Admitting: Family Medicine

## 2009-09-29 ENCOUNTER — Encounter: Payer: Self-pay | Admitting: Family Medicine

## 2009-10-06 ENCOUNTER — Telehealth: Payer: Self-pay | Admitting: Family Medicine

## 2009-10-06 ENCOUNTER — Encounter (INDEPENDENT_AMBULATORY_CARE_PROVIDER_SITE_OTHER): Payer: Self-pay | Admitting: *Deleted

## 2009-10-13 ENCOUNTER — Encounter: Payer: Self-pay | Admitting: Family Medicine

## 2009-10-14 ENCOUNTER — Ambulatory Visit: Payer: Self-pay | Admitting: Family Medicine

## 2009-10-14 DIAGNOSIS — M549 Dorsalgia, unspecified: Secondary | ICD-10-CM | POA: Insufficient documentation

## 2009-10-15 ENCOUNTER — Encounter: Admission: RE | Admit: 2009-10-15 | Discharge: 2009-10-15 | Payer: Self-pay | Admitting: Family Medicine

## 2009-10-23 ENCOUNTER — Telehealth: Payer: Self-pay | Admitting: Family Medicine

## 2009-11-03 ENCOUNTER — Ambulatory Visit: Payer: Self-pay | Admitting: Family Medicine

## 2009-11-03 DIAGNOSIS — E7800 Pure hypercholesterolemia, unspecified: Secondary | ICD-10-CM

## 2009-11-06 LAB — CONVERTED CEMR LAB
AST: 20 units/L (ref 0–37)
Albumin: 4.1 g/dL (ref 3.5–5.2)
BUN: 16 mg/dL (ref 6–23)
Calcium: 9.6 mg/dL (ref 8.4–10.5)
Cholesterol: 166 mg/dL (ref 0–200)
Creatinine, Ser: 0.9 mg/dL (ref 0.4–1.2)
Creatinine,U: 71.8 mg/dL
Glucose, Bld: 95 mg/dL (ref 70–99)
HDL: 75.1 mg/dL (ref >39.00)
Hgb A1c MFr Bld: 6.4 % (ref 4.6–6.5)
LDL Cholesterol: 73 mg/dL (ref 0–99)
Microalb Creat Ratio: 15.3 mg/g (ref 0.0–30.0)
Phosphorus: 3.9 mg/dL (ref 2.3–4.6)
VLDL: 17.8 mg/dL (ref 0.0–40.0)

## 2009-11-07 ENCOUNTER — Ambulatory Visit: Payer: Self-pay | Admitting: Family Medicine

## 2009-11-17 ENCOUNTER — Telehealth: Payer: Self-pay | Admitting: Family Medicine

## 2010-03-02 ENCOUNTER — Telehealth: Payer: Self-pay | Admitting: Family Medicine

## 2010-03-22 IMAGING — CR DG LUMBAR SPINE COMPLETE 4+V
5 series · 5 of 5 positions shown · non-contrast
Comparison: CT abdomen pelvis 12/29/2006 and lumbar spine one-view
05/09/2006

CLINICAL DATA: Back pain radiating to the left hip.

LUMBAR SPINE - COMPLETE 4+ VIEW

[view not recorded (1 of 5)]
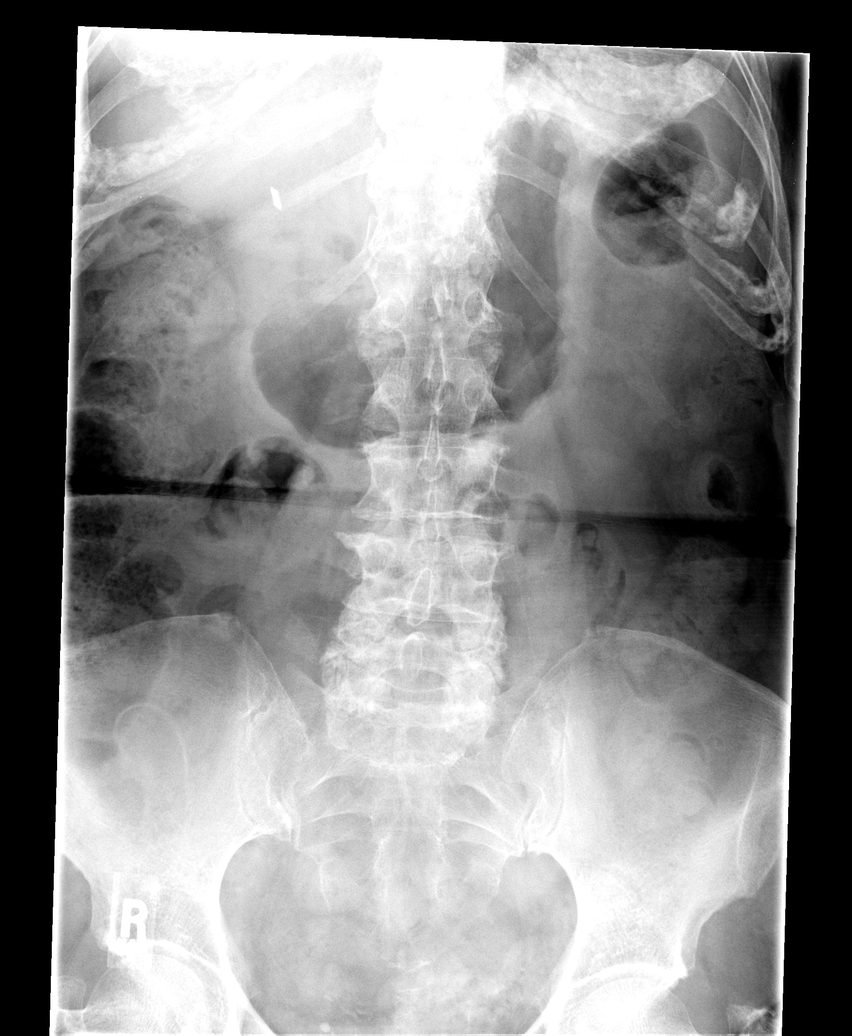

[view not recorded (2 of 5)]
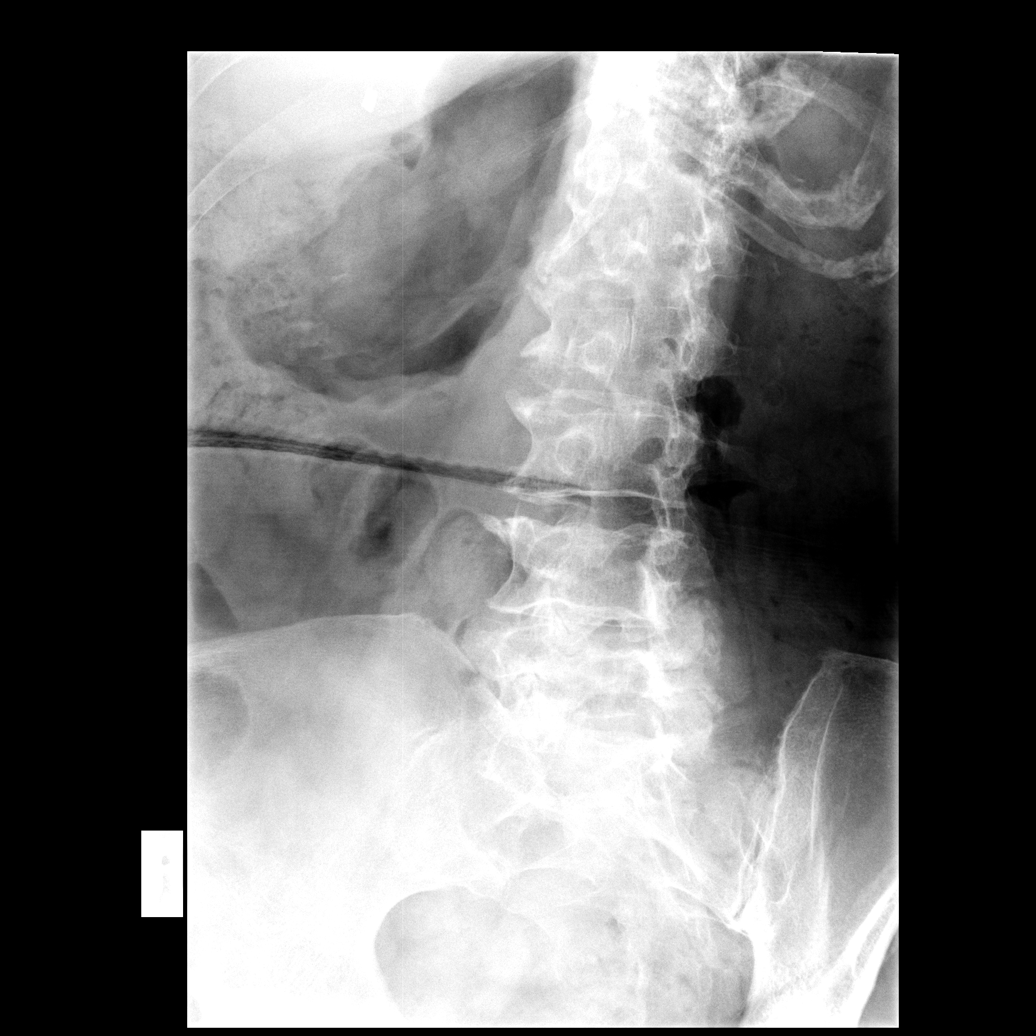

[view not recorded (3 of 5)]
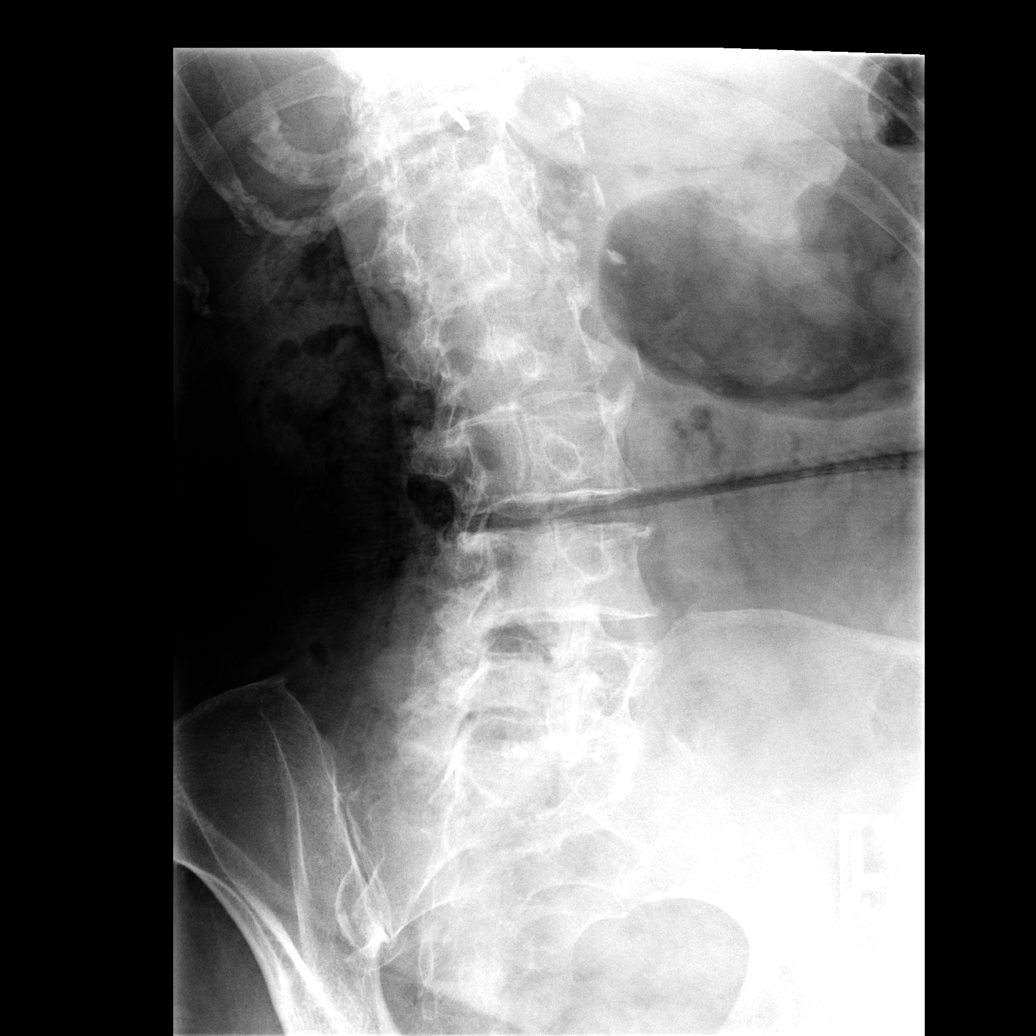

[view not recorded (4 of 5)]
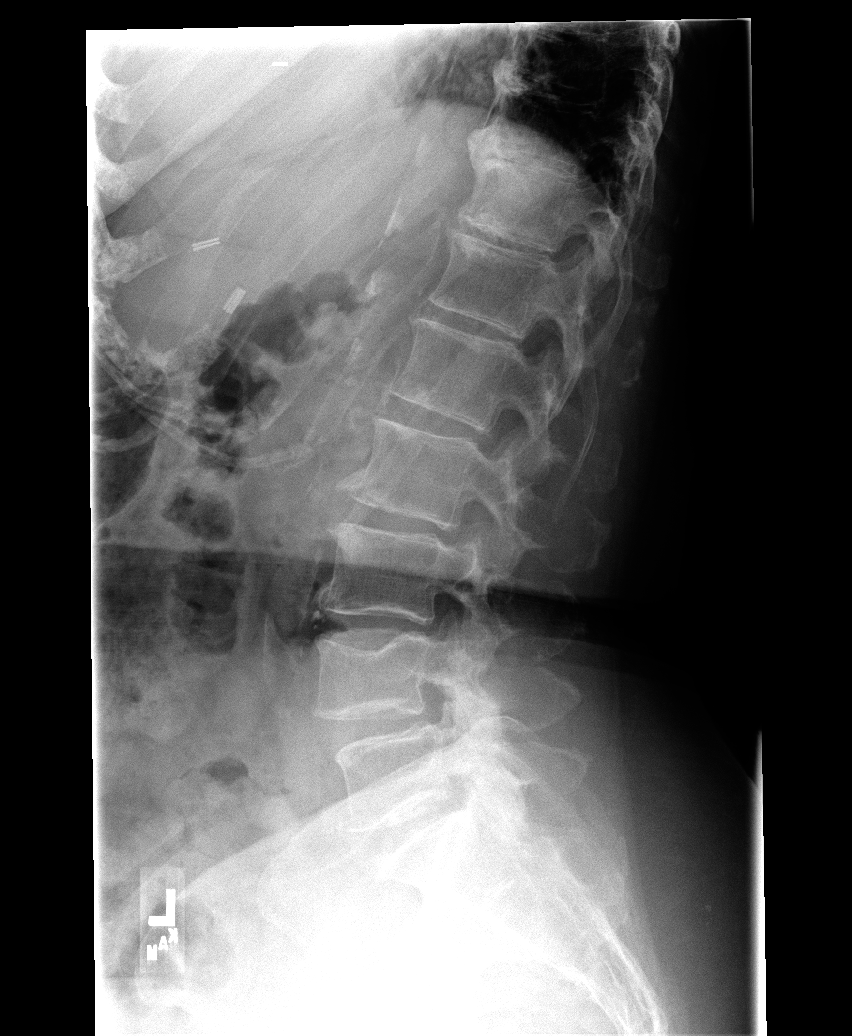

[view not recorded (5 of 5)]
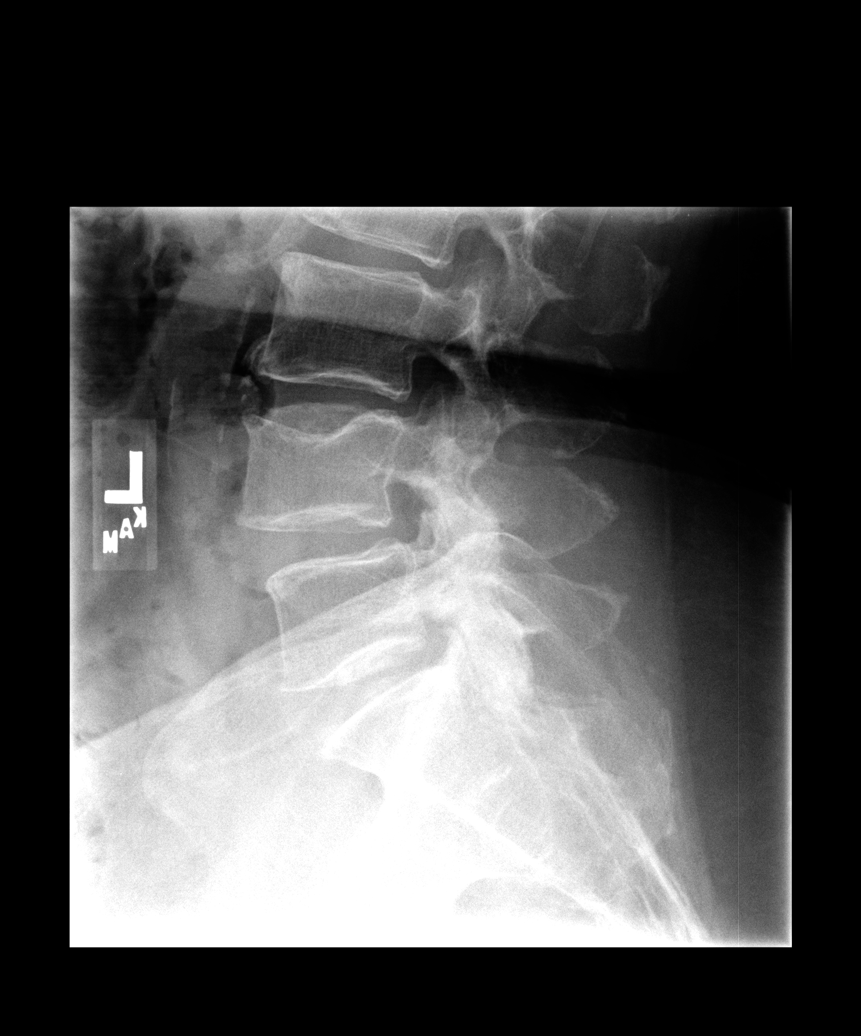

[5 of 5 positions shown; findings below may reference images not displayed]

FINDINGS: Minimal grade 1 anterolisthesis of L4 on L5.  Mild
compression of the L4 superior endplate is new from 12/29/2006.
Alignment is otherwise anatomic.  Mild multilevel endplate
degenerative changes are seen.  Marginal osteophytosis is prominent
in the lower thoracic spine.  Facet hypertrophy is seen in the
lower lumbar spine.  Disc space height appears relatively well
maintained.
IMPRESSION: 1.  Mild compression of the superior endplate of L4 is new from
12/29/2006.
2.  Minimal grade 1 anterolisthesis of L4 on L5.
3.  Spondylosis.

## 2010-04-01 ENCOUNTER — Encounter: Payer: Self-pay | Admitting: Family Medicine

## 2010-04-07 ENCOUNTER — Telehealth: Payer: Self-pay | Admitting: Family Medicine

## 2010-05-04 ENCOUNTER — Ambulatory Visit: Payer: Self-pay | Admitting: Family Medicine

## 2010-05-04 LAB — CONVERTED CEMR LAB
AST: 21 units/L (ref 0–37)
BUN: 14 mg/dL (ref 6–23)
Chloride: 97 meq/L (ref 96–112)
Cholesterol: 162 mg/dL (ref 0–200)
GFR calc non Af Amer: 68.21 mL/min (ref >60)
HCT: 42.7 % (ref 36.0–46.0)
LDL Cholesterol: 83 mg/dL (ref 0–99)
Lymphocytes Relative: 14.9 % (ref 12.0–46.0)
Lymphs Abs: 1 10*3/uL (ref 0.7–4.0)
Monocytes Absolute: 0.9 10*3/uL (ref 0.1–1.0)
Monocytes Relative: 13.2 % — ABNORMAL HIGH (ref 3.0–12.0)
Neutro Abs: 4.9 10*3/uL (ref 1.4–7.7)
Phosphorus: 3.6 mg/dL (ref 2.3–4.6)
Potassium: 3.8 meq/L (ref 3.5–5.1)
RBC: 4.53 M/uL (ref 3.87–5.11)
Sodium: 139 meq/L (ref 135–145)
TSH: 1.71 microintl units/mL (ref 0.35–5.50)
Total CHOL/HDL Ratio: 3

## 2010-05-11 ENCOUNTER — Ambulatory Visit: Payer: Self-pay | Admitting: Family Medicine

## 2010-05-11 DIAGNOSIS — R739 Hyperglycemia, unspecified: Secondary | ICD-10-CM

## 2010-05-22 ENCOUNTER — Encounter: Payer: Self-pay | Admitting: Family Medicine

## 2010-06-09 ENCOUNTER — Encounter: Payer: Self-pay | Admitting: Family Medicine

## 2010-06-10 ENCOUNTER — Ambulatory Visit: Payer: Self-pay | Admitting: Family Medicine

## 2010-07-13 ENCOUNTER — Ambulatory Visit: Payer: Self-pay | Admitting: Internal Medicine

## 2010-07-13 ENCOUNTER — Ambulatory Visit: Payer: Self-pay | Admitting: Family Medicine

## 2010-09-08 ENCOUNTER — Ambulatory Visit
Admission: RE | Admit: 2010-09-08 | Discharge: 2010-09-08 | Payer: Self-pay | Source: Home / Self Care | Attending: Family Medicine | Admitting: Family Medicine

## 2010-09-30 ENCOUNTER — Encounter: Payer: Self-pay | Admitting: Family Medicine

## 2010-10-06 ENCOUNTER — Encounter: Payer: Self-pay | Admitting: Family Medicine

## 2010-10-06 LAB — HM MAMMOGRAPHY: HM Mammogram: ABNORMAL

## 2010-10-11 LAB — CONVERTED CEMR LAB
Casts: 0 /lpf
Glucose, Urine, Semiquant: NEGATIVE
Nitrite: NEGATIVE
Protein, U semiquant: NEGATIVE
Specific Gravity, Urine: 1.015
Urobilinogen, UA: 0.2
WBC Urine, dipstick: NEGATIVE
pH: 5

## 2010-10-13 NOTE — Miscellaneous (Signed)
Summary: Biaxin added to med allergy list caused nausea   Clinical Lists Changes  Allergies: Added new allergy or adverse reaction of BIAXIN     Prior Medications: OMEPRAZOLE 20 MG CPDR (OMEPRAZOLE) 1 by mouth each am DILTIAZEM HCL CR 240 MG CP24 (DILTIAZEM HCL) 1 by mouth once daily HYDROCHLOROTHIAZIDE 50 MG TABS (HYDROCHLOROTHIAZIDE) 1 by mouth once daily ADULT ASPIRIN LOW STRENGTH 81 MG  TBDP (ASPIRIN) one by mouth daily TYLENOL ARTHRITIS PAIN 650 MG  TBCR (ACETAMINOPHEN) 2 by mouth twice a day CALTRATE 600 1500 MG  TABS (CALCIUM CARBONATE) two by mouth daily ROPINIROLE HCL 1 MG  TABS (ROPINIROLE HCL) one by mouth daily STOOL SOFTNER () 2 daily MASTECTOMY BRAS WITH PROSTHESIS () to use as directed  v10.1 prior breast cancer CITRUCEL  POWD (METHYLCELLULOSE (LAXATIVE)) once a day MULTIVITAMINS  TABS (MULTIPLE VITAMIN) Take 1 tablet by mouth once a day HYDROCORTISONE VALERATE 0.2 % CREA (HYDROCORTISONE VALERATE) apply to aff area in small amount once daily as needed Current Allergies: ! BIAXIN CODEINE SULFA TETRACYCLINE AUGMENTIN

## 2010-10-13 NOTE — Assessment & Plan Note (Signed)
Summary: CHECK PLACE ON LEFT SIDE/CLE   Vital Signs:  Patient profile:   77 year old female Weight:      159.25 pounds Temp:     98.4 degrees F oral Pulse rate:   84 / minute Pulse rhythm:   regular BP sitting:   116 / 80  (right arm) Cuff size:   regular  Vitals Entered By: Selena Batten Dance CMA Duncan Dull) (July 13, 2010 11:31 AM) CC: Check tender place on Left side   History of Present Illness: CC: check L side   1 1/2 wk hx of L ant ribcage soreness, no radiation.  tender to touch.  sore with coughing/sneezing.  Denies injury to that side.  No fevers/chills, abd pain, n/v, no pain with deep breath in.  Walks for exercise, no increase recently.  no sob  h/o R shoulder surgery, so has been using L arm more, h/o L mastectomy 12 years ago for brca.  Current Medications (verified): 1)  Omeprazole 20 Mg Cpdr (Omeprazole) .Marland Kitchen.. 1 By Mouth Each Am 2)  Diltiazem Hcl Cr 240 Mg Cp24 (Diltiazem Hcl) .Marland Kitchen.. 1 By Mouth Once Daily 3)  Hydrochlorothiazide 50 Mg Tabs (Hydrochlorothiazide) .Marland Kitchen.. 1 By Mouth Once Daily 4)  Adult Aspirin Low Strength 81 Mg  Tbdp (Aspirin) .... One By Mouth Daily 5)  Tylenol Arthritis Pain 650 Mg  Tbcr (Acetaminophen) .... 2 By Mouth Twice A Day 6)  Ropinirole Hcl 1 Mg  Tabs (Ropinirole Hcl) .... One By Mouth Daily 7)  Stool Softner .... 2 Daily 8)  Mastectomy Prosthesis .... To Use As Directed  V10.1 Prior Breast Cancer 9)  Citrucel  Powd (Methylcellulose (Laxative)) .... Once A Day 10)  Multivitamins  Tabs (Multiple Vitamin) .... Take 1 Tablet By Mouth Once A Day 11)  Hydrocortisone Valerate 0.2 % Crea (Hydrocortisone Valerate) .... Apply To Aff Area in Small Amount Once Daily As Needed 12)  Tums 500 Mg Chew (Calcium Carbonate Antacid) .... Chew Two Tums Daily 13)  Vitamin D 400 Unit  Tabs (Cholecalciferol) .... Two Tablets By Mouth Daily 14)  Fosamax 70 Mg Tabs (Alendronate Sodium) .Marland Kitchen.. 1 By Mouth Once Weekly  Allergies: 1)  ! Biaxin 2)  Codeine 3)  Sulfa 4)   Tetracycline 5)  Augmentin  Past History:  Past Medical History: Last updated: 11/07/2009 Breast cancer, hx of 12/99 Hypertension GERD Diabetes mellitus, type II 12805 all rhinitis OP incontinence  chronic cough spinal comp fx  Past Surgical History: Last updated: 05/02/2009 Hysterectomy/TAH - BSO - ENDOMETRIAL CA Mastectomy, L - BREAST CA DEXA 1994 DEXA LS OSTEOPOROSIS 2/00 DEXA - SAME, OP, LS, SPINE, HIP 2/02 DEXA - BMD SPINE, LOW BMD HIP 3/04 COLONOSCOPY, DIVERTICULOSIS, HEMORRHOIDS 3/02 BACK SURGERY (DR. Ophelia Charter) DISK 2007 FOCUS - NEG 8/98 10/08 removal of basal cell skin cancer 09 CCY 1/09 gamma imaging [place]- ok cataract surg 7/10  Social History: Last updated: 04/22/2008 non smoker no alcohol  goes to [facility] 3 days per week for exercise   Review of Systems       per HPI  Physical Exam  General:  overweight but generally well appearing  Chest Wall:  slightly prominent L inferior ribcage.  tender to palpation left anterior 9-10th ribs at costochondral margin.  no other deformity or tenderness bilat.  somewhat tight brastrap Lungs:  Normal respiratory effort, chest expands symmetrically. Lungs are clear to auscultation throughout, no crackles or wheezes. Heart:  Normal rate and regular rhythm. S1 and S2 normal without gallop, murmur, click, rub or  other extra sounds.   Impression & Recommendations:  Problem # 1:  RIB PAIN, LEFT SIDED (ICD-786.50) obtain xray given duration of sxs and h/o brca.  likely chostochondritis, treat with tylenol, ice, small dose of nsaids.  discussed rib view looked ok to me, would call if any change in plan based on radiologist read.  Orders: T-Ribs Unilateral 2 Views (71100TC)  Complete Medication List: 1)  Omeprazole 20 Mg Cpdr (Omeprazole) .Marland Kitchen.. 1 by mouth each am 2)  Diltiazem Hcl Cr 240 Mg Cp24 (Diltiazem hcl) .Marland Kitchen.. 1 by mouth once daily 3)  Hydrochlorothiazide 50 Mg Tabs (Hydrochlorothiazide) .Marland Kitchen.. 1 by mouth once daily 4)   Adult Aspirin Low Strength 81 Mg Tbdp (Aspirin) .... One by mouth daily 5)  Tylenol Arthritis Pain 650 Mg Tbcr (Acetaminophen) .... 2 by mouth twice a day 6)  Ropinirole Hcl 1 Mg Tabs (Ropinirole hcl) .... One by mouth daily 7)  Stool Softner  .... 2 daily 8)  Mastectomy Prosthesis  .... To use as directed  v10.1 prior breast cancer 9)  Citrucel Powd (Methylcellulose (laxative)) .... Once a day 10)  Multivitamins Tabs (Multiple vitamin) .... Take 1 tablet by mouth once a day 11)  Hydrocortisone Valerate 0.2 % Crea (Hydrocortisone valerate) .... Apply to aff area in small amount once daily as needed 12)  Tums 500 Mg Chew (Calcium carbonate antacid) .... Chew two tums daily 13)  Vitamin D 400 Unit Tabs (Cholecalciferol) .... Two tablets by mouth daily 14)  Fosamax 70 Mg Tabs (Alendronate sodium) .Marland Kitchen.. 1 by mouth once weekly  Patient Instructions: 1)  Looks like it could be a bit of costochondritis. 2)  Treat with ice to that area for no more than 15 minutes, 2-3 times a day, continue tylenol.  can try small amount of anti inflammatory like advil/motrin  or alleve for soreness. 3)  Please let us know if not improving as expected.   4)  Good to meet you today, call clinic with questions.   Orders Added: 1)  T-Ribs Unilateral 2 Views [71100TC] 2)  Est. Patient Level III [16109]    Current Allergies (reviewed today): ! BIAXIN CODEINE SULFA TETRACYCLINE AUGMENTIN

## 2010-10-13 NOTE — Letter (Signed)
Summary: [facility]   Imported By: Sherian Rein 05/30/2010 09:04:21  _____________________________________________________________________  External Attachment:    Type:   Image     Comment:   External Document

## 2010-10-13 NOTE — Letter (Signed)
Summary: [facility]   Imported By: Maryln Gottron 04/10/2010 11:09:38  _____________________________________________________________________  External Attachment:    Type:   Image     Comment:   External Document

## 2010-10-13 NOTE — Letter (Signed)
Summary: [facility]   Imported By: Lanelle Bal 06/17/2010 13:46:44  _____________________________________________________________________  External Attachment:    Type:   Image     Comment:   External Document

## 2010-10-13 NOTE — Assessment & Plan Note (Signed)
Summary: BACK PAIN/DLO   Vital Signs:  Patient profile:   77 year old female Weight:      170.25 pounds BMI:     33.37 Temp:     98.0 degrees F oral Pulse rate:   100 / minute Pulse rhythm:   regular BP sitting:   120 / 70  (right arm) Cuff size:   regular  Vitals Entered By: Linde Gillis CMA Duncan Dull) (October 14, 2009 4:04 PM) CC: back pain   History of Present Illness: started with back pain -- last week  was hurting on R- now is worse on the L (all the way across back)  L leg aches some -also pain on the outside of hip  no particular injury-- was packing up some heavy boxes - but did not bother her until the next day  has been trying flexeril (thinks it helps a little but constipated her ) also some tylenol for arthritis  no numbness  no weakness  no electric or tingly pains     back surgery years ago-- had a cyst  told this may come back  Dr Ophelia Charter was her doctor   still some pain in foot after corn/callus removal  her shoes realy hurt the area  Allergies: 1)  ! Biaxin 2)  Codeine 3)  Sulfa 4)  Tetracycline 5)  Augmentin  Past History:  Past Medical History: Last updated: 12/06/2008 Breast cancer, hx of 12/99 Hypertension GERD Diabetes mellitus, type II 12805 all rhinitis OP incontinence  chronic cough  Past Surgical History: Last updated: 05/02/2009 Hysterectomy/TAH - BSO - ENDOMETRIAL CA Mastectomy, L - BREAST CA DEXA 1994 DEXA LS OSTEOPOROSIS 2/00 DEXA - SAME, OP, LS, SPINE, HIP 2/02 DEXA - BMD SPINE, LOW BMD HIP 3/04 COLONOSCOPY, DIVERTICULOSIS, HEMORRHOIDS 3/02 BACK SURGERY (DR. Ophelia Charter) DISK 2007 FOCUS - NEG 8/98 10/08 removal of basal cell skin cancer 09 CCY 1/09 gamma imaging [place]- ok cataract surg 7/10  Family History: Last updated: 11/29/2006 BROTHER WITH COLON CA 12/04  Social History: Last updated: 04/22/2008 non smoker no alcohol  goes to [facility] 3 days per week for exercise   Risk Factors: Smoking Status: never  (11/29/2006)  Review of Systems General:  Denies fatigue, fever, loss of appetite, and malaise. Eyes:  Denies blurring. CV:  Denies chest pain or discomfort and palpitations. Resp:  Denies cough and wheezing. GI:  Denies abdominal pain, bloody stools, change in bowel habits, and nausea. GU:  Denies discharge, dysuria, hematuria, and urinary frequency. MS:  Complains of low back pain and stiffness; denies joint redness, joint swelling, cramps, and muscle weakness. Derm:  Denies rash. Neuro:  Denies numbness and tingling. Heme:  Denies abnormal bruising and bleeding.  Physical Exam  General:  alert, well-developed, well-nourished, and well-hydrated.  NAD Head:  normocephalic, atraumatic, and no abnormalities observed.   Eyes:  vision grossly intact, pupils equal, pupils round, pupils reactive to light, and no injection.   Mouth:  pharynx pink and moist, no erythema, and no exudates.   Neck:  supple with full rom and no masses or thyromegally, no JVD or carotid bruit  Lungs:  Normal respiratory effort, chest expands symmetrically. Lungs are clear to auscultation, no crackles or wheezes. Heart:  Normal rate and regular rhythm. S1 and S2 normal without gallop, murmur, click, rub or other extra sounds. Msk:  LS mild tenderness L4- L5 tender L perilumbar musculature and piriformis area  pos SLR on L for mild leg pain  no cva tenderness  spine  flex 90 deg/ ext 20 deg with pain  pain o L lat bend  Neurologic:  sensation intact to light touch, gait normal, and DTRs symmetrical and normal.   Skin:  Intact without suspicious lesions or rashes  callus on R lateral foot - tender and uninflammed  Cervical Nodes:  No lymphadenopathy noted Inguinal Nodes:  No significant adenopathy Psych:  normal affect, talkative and pleasant    Impression & Recommendations:  Problem # 1:  BACK PAIN (ICD-724.5) Assessment New suspect musculoskelatal strain / without focal neurol symptoms disc poss  sciatica  will get LS film tx with mobic as tolerated  Her updated medication list for this problem includes:    Adult Aspirin Low Strength 81 Mg Tbdp (Aspirin) ..... One by mouth daily    Tylenol Arthritis Pain 650 Mg Tbcr (Acetaminophen) .Marland Kitchen... 2 by mouth twice a day    Flexeril 10 Mg Tabs (Cyclobenzaprine hcl) .Marland Kitchen... 1/2 -1 by mouth up to every 8 hours as needed severe back pain watch for sedation    Mobic 15 Mg Tabs (Meloxicam) .Marland Kitchen... 1 by mouth once daily as needed pain - take with food  Orders: Radiology Referral (Radiology)  Problem # 2:  CORNS AND CALLOSITIES (ICD-700) Assessment: Unchanged pain persists after shave of callus on R lat foot  ref to podiatry  adv to continue wearing corn pad Orders: Podiatry Referral (Podiatry)  Complete Medication List: 1)  Omeprazole 20 Mg Cpdr (Omeprazole) .Marland Kitchen.. 1 by mouth each am 2)  Diltiazem Hcl Cr 240 Mg Cp24 (Diltiazem hcl) .Marland Kitchen.. 1 by mouth once daily 3)  Hydrochlorothiazide 50 Mg Tabs (Hydrochlorothiazide) .Marland Kitchen.. 1 by mouth once daily 4)  Adult Aspirin Low Strength 81 Mg Tbdp (Aspirin) .... One by mouth daily 5)  Tylenol Arthritis Pain 650 Mg Tbcr (Acetaminophen) .... 2 by mouth twice a day 6)  Caltrate 600 1500 Mg Tabs (Calcium carbonate) .... Two by mouth daily 7)  Ropinirole Hcl 1 Mg Tabs (Ropinirole hcl) .... One by mouth daily 8)  Stool Softner  .... 2 daily 9)  Mastectomy Prosthesis  .... To use as directed  v10.1 prior breast cancer 10)  Citrucel Powd (Methylcellulose (laxative)) .... Once a day 11)  Multivitamins Tabs (Multiple vitamin) .... Take 1 tablet by mouth once a day 12)  Hydrocortisone Valerate 0.2 % Crea (Hydrocortisone valerate) .... Apply to aff area in small amount once daily as needed 13)  Flexeril 10 Mg Tabs (Cyclobenzaprine hcl) .... 1/2 -1 by mouth up to every 8 hours as needed severe back pain watch for sedation 14)  Mobic 15 Mg Tabs (Meloxicam) .Marland Kitchen.. 1 by mouth once daily as needed pain - take with food  Patient  Instructions: 1)  we will refer you for x ray and also to podiatry at check out  2)  try mobic -- with food for pain - update me if any side effects like GI upset  3)  use warm compress on back when it hurts  4)  try to keep active/ walking - but no heavy lifting  Prescriptions: MOBIC 15 MG TABS (MELOXICAM) 1 by mouth once daily as needed pain - take with food  #30 x 0   Entered and Authorized by:   Judith Part MD   Signed by:   Judith Part MD on 10/14/2009   Method used:   Electronically to        [facility]* (retail)       [address]  [place], [place]  [address]       Ph: [phone]       Fax: [phone]   RxID:   [id number]   Current Allergies (reviewed today): ! BIAXIN CODEINE SULFA TETRACYCLINE AUGMENTIN

## 2010-10-13 NOTE — Progress Notes (Signed)
Summary: Hydrocortisone Val 0.2% cream refill  Phone Note Refill Request Call back at [phone] Message from:  [facility] on April 07, 2010 11:52 AM  Refills Requested: Medication #1:  HYDROCORTISONE VALERATE 0.2 % CREA apply to aff area in small amount once daily as needed [facility] electronically sent refill request for Hydrocortisone val 0.2% cream. No refill date sent. Please advise.    Method Requested: Telephone to Pharmacy Initial call taken by: Lewanda Rife LPN,  April 07, 2010 11:53 AM  Follow-up for Phone Call        px written on EMR for call in  Follow-up by: Judith Part MD,  April 07, 2010 1:19 PM  Additional Follow-up for Phone Call Additional follow up Details #1::        Medication phoned to [facility] as instructed. Lewanda Rife LPN  April 07, 2010 2:12 PM     New/Updated Medications: HYDROCORTISONE VALERATE 0.2 % CREA (HYDROCORTISONE VALERATE) apply to aff area in small amount once daily as needed Prescriptions: HYDROCORTISONE VALERATE 0.2 % CREA (HYDROCORTISONE VALERATE) apply to aff area in small amount once daily as needed  #15 gram x 1   Entered and Authorized by:   Judith Part MD   Signed by:   Lewanda Rife LPN on 43/32/9518   Method used:   Telephoned to ...       [facility] [address] (retail)       [address]       [place], [place]  [address]       Ph: [phone]       Fax: [phone]   RxID:   [id number]

## 2010-10-13 NOTE — Assessment & Plan Note (Signed)
Summary: COLD/CLE   Vital Signs:  Patient profile:   77 year old female Height:      60 inches Weight:      171.75 pounds BMI:     33.66 Temp:     98.8 degrees F oral Pulse rate:   88 / minute Pulse rhythm:   regular Resp:     24 per minute BP sitting:   150 / 78  (right arm) Cuff size:   regular  Vitals Entered By: Lewanda Rife LPN (September 17, 2009 2:34 PM)  Primary Care Provider:  [facility]  CC:  productive cough with clear mucus some of the time other times justdry hacky cough, not resting at night due to cough, and face hurts across forehead.Marland Kitchen  History of Present Illness: Here for productive cough, facial pain in forehead--onset x 3d--increased cough, no fever or chills, some wheezing --taking cough drops--honey lemon, tussionex--not holding past 2 nights--thinks Tussinex is "old"--asked for new small bottle  Allergies: 1)  Codeine 2)  Sulfa 3)  Tetracycline 4)  Augmentin  Review of Systems      See HPI  Physical Exam  General:  alert, well-developed, well-nourished, and well-hydrated.  NAD Ears:  TMs retracted with some fluid Nose:  no airflow obstruction, mucosal erythema, and mucosal edema.  crusting Mouth:  no exudates and pharyngeal erythema.  mucus in posterior pharynx Lungs:  moist harsh cough, scattered wheezes that resolve with several coughs Neurologic:  alert & oriented X3, sensation intact to light touch, and gait normal.   Cervical Nodes:  no anterior cervical adenopathy and no posterior cervical adenopathy.   Psych:  normally interactive, flat affect, and subdued.     Impression & Recommendations:  Problem # 1:  BRONCHITIS-ACUTE (ICD-466.0) Assessment New continue comfort care measures: increase po fluids, rest, tylenol or IBP as needed will start on biaxin xl two times a day x 7d will use tussionex at hs as needed cough see back if not improved in 7-10d Her updated medication list for this problem includes:    Biaxin Xl 500 Mg Xr24h-tab  (Clarithromycin) .Marland Kitchen... Take 1 two times a day for bronchitis    Tussionex Pennkinetic Er 8-10 Mg/28ml Lqcr (Chlorpheniramine-hydrocodone) .Marland Kitchen... Take 1 tsp at bedtime for cough  Complete Medication List: 1)  Omeprazole 20 Mg Cpdr (Omeprazole) .Marland Kitchen.. 1 by mouth each am 2)  Diltiazem Hcl Cr 240 Mg Cp24 (Diltiazem hcl) .Marland Kitchen.. 1 by mouth once daily 3)  Hydrochlorothiazide 50 Mg Tabs (Hydrochlorothiazide) .Marland Kitchen.. 1 by mouth once daily 4)  Adult Aspirin Low Strength 81 Mg Tbdp (Aspirin) .... One by mouth daily 5)  Tylenol Arthritis Pain 650 Mg Tbcr (Acetaminophen) .... 2 by mouth twice a day 6)  Caltrate 600 1500 Mg Tabs (Calcium carbonate) .... Two by mouth daily 7)  Ropinirole Hcl 1 Mg Tabs (Ropinirole hcl) .... One by mouth daily 8)  Stool Softner  .... 2 daily 9)  Mastectomy Bras With Prosthesis  .... To use as directed  v10.1 prior breast cancer 10)  Citrucel Powd (Methylcellulose (laxative)) .... Once a day 11)  Multivitamins Tabs (Multiple vitamin) .... Take 1 tablet by mouth once a day 12)  Hydrocortisone Valerate 0.2 % Crea (Hydrocortisone valerate) .... Apply to aff area in small amount once daily as needed 13)  Biaxin Xl 500 Mg Xr24h-tab (Clarithromycin) .... Take 1 two times a day for bronchitis 14)  Tussionex Pennkinetic Er 8-10 Mg/34ml Lqcr (Chlorpheniramine-hydrocodone) .... Take 1 tsp at bedtime for cough Prescriptions: Sandria Senter ER  8-10 MG/5ML LQCR (CHLORPHENIRAMINE-HYDROCODONE) take 1 tsp at bedtime for cough  #139ml x 0   Entered and Authorized by:   Gildardo Griffes FNP   Signed by:   Gildardo Griffes FNP on 09/17/2009   Method used:   Print then Give to Patient   RxID:   [id number] BIAXIN XL 500 MG XR24H-TAB (CLARITHROMYCIN) take 1 two times a day for bronchitis  #14 x 0   Entered and Authorized by:   Gildardo Griffes FNP   Signed by:   Gildardo Griffes FNP on 09/17/2009   Method used:   Electronically to        [facility]*  (retail)       [address]       [place], [place]  [address]       Ph: [phone]       Fax: [phone]   RxID:   [id number]   Current Allergies (reviewed today): CODEINE SULFA TETRACYCLINE AUGMENTIN

## 2010-10-13 NOTE — Progress Notes (Signed)
Summary: ROPINIROLE  Phone Note Refill Request Message from:  [id number] on November 17, 2009 11:41 AM  Refills Requested: Medication #1:  ROPINIROLE HCL 1 MG  TABS one by mouth daily   Last Refilled: 08/12/2009 fax request, please send back to me   Method Requested: Electronic Initial call taken by: Mervin Hack CMA Duncan Dull),  November 17, 2009 11:41 AM  Follow-up for Phone Call        px written on EMR for call in  Follow-up by: Judith Part MD,  November 17, 2009 1:01 PM  Additional Follow-up for Phone Call Additional follow up Details #1::        Rx called to pharmacy Additional Follow-up by: Linde Gillis CMA Duncan Dull),  November 17, 2009 2:10 PM    Prescriptions: ROPINIROLE HCL 1 MG  TABS (ROPINIROLE HCL) one by mouth daily  #90 x 3   Entered and Authorized by:   Judith Part MD   Signed by:   Lewanda Rife LPN on 10/62/6948   Method used:   Telephoned to ...       [facility] [address] (retail)       [address]       [place], [place]  [address]       Ph: [phone]       Fax: [phone]   RxID:   [id number]

## 2010-10-13 NOTE — Letter (Signed)
Summary: Return Visit/[facility]  Return Visit/[facility]   Imported By: Sherian Rein 05/30/2010 09:05:54  _____________________________________________________________________  External Attachment:    Type:   Image     Comment:   External Document

## 2010-10-13 NOTE — Miscellaneous (Signed)
Summary: mammogram results   Clinical Lists Changes  Observations: Added new observation of MAMMO DUE: 10/2010 (10/06/2009 9:53) Added new observation of MAMMOGRAM: normal (10/06/2009 9:53)      Preventive Care Screening  Mammogram:    Date:  10/06/2009    Next Due:  10/2010    Results:  normal

## 2010-10-13 NOTE — Assessment & Plan Note (Signed)
Summary: F/U/CLE   Vital Signs:  Patient profile:   77 year old female Height:      60 inches Weight:      166.75 pounds BMI:     32.68 Temp:     97.7 degrees F oral Pulse rate:   92 / minute Pulse rhythm:   regular BP sitting:   118 / 68  (right arm) Cuff size:   regular  Vitals Entered By: Lewanda Rife LPN (November 07, 2009 3:17 PM)  History of Present Illness: here for f/u of DM and HTN and lipids has been feeling ok   wt is down 4 lb is working on healthy diet   bp continues to be very well controlled  good chol/improved from last time with LDL 73 down from the 90s   AIC 6.4- relatively stable is up to date on eye exam  is up to date on imms  no ace or arb due to chronic cough  wants to work more on her wt after the winter  still limited by orthopedics for another month  still limited not to bowl -- but will in the future     Allergies: 1)  ! Biaxin 2)  Codeine 3)  Sulfa 4)  Tetracycline 5)  Augmentin  Past History:  Past Surgical History: Last updated: 05/02/2009 Hysterectomy/TAH - BSO - ENDOMETRIAL CA Mastectomy, L - BREAST CA DEXA 1994 DEXA LS OSTEOPOROSIS 2/00 DEXA - SAME, OP, LS, SPINE, HIP 2/02 DEXA - BMD SPINE, LOW BMD HIP 3/04 COLONOSCOPY, DIVERTICULOSIS, HEMORRHOIDS 3/02 BACK SURGERY (DR. Ophelia Charter) DISK 2007 FOCUS - NEG 8/98 10/08 removal of basal cell skin cancer 09 CCY 1/09 gamma imaging [place]- ok cataract surg 7/10  Family History: Last updated: 11/29/2006 BROTHER WITH COLON CA 12/04  Social History: Last updated: 04/22/2008 non smoker no alcohol  goes to [facility] 3 days per week for exercise   Risk Factors: Smoking Status: never (11/29/2006)  Past Medical History: Breast cancer, hx of 12/99 Hypertension GERD Diabetes mellitus, type II 12805 all rhinitis OP incontinence  chronic cough spinal comp fx  Review of Systems General:  Denies fatigue, fever, loss of appetite, and malaise. Eyes:  Denies blurring and eye  irritation. CV:  Denies chest pain or discomfort, lightheadness, and palpitations. Resp:  Complains of cough; denies shortness of breath and wheezing; no real change in her chronic cough. GI:  Denies abdominal pain and change in bowel habits. Derm:  Denies poor wound healing and rash. Neuro:  Denies numbness, tingling, and weakness. Endo:  Denies excessive thirst and excessive urination. Heme:  Denies abnormal bruising and bleeding.  Physical Exam  General:  overweight but generally well appearing  Head:  normocephalic, atraumatic, and no abnormalities observed.   Eyes:  vision grossly intact, pupils equal, pupils round, and pupils reactive to light.  no conjunctival pallor, injection or icterus  Mouth:  pharynx pink and moist.   Neck:  supple with full rom and no masses or thyromegally, no JVD or carotid bruit  Chest Wall:  No deformities, masses, or tenderness noted. Lungs:  Normal respiratory effort, chest expands symmetrically. Lungs are clear to auscultation, no crackles or wheezes. somewhat distant bs at bases  Heart:  Normal rate and regular rhythm. S1 and S2 normal without gallop, murmur, click, rub or other extra sounds. Abdomen:  Bowel sounds positive,abdomen soft and non-tender without masses, organomegaly or hernias noted. no renal bruits  Msk:  No deformity or scoliosis noted of thoracic or lumbar spine.  Pulses:  R and L carotid,radial,femoral,dorsalis pedis and posterior tibial pulses are full and equal bilaterally Extremities:  No clubbing, cyanosis, edema, or deformity noted with normal full range of motion of all joints.   Neurologic:  sensation intact to light touch, gait normal, and DTRs symmetrical and normal.   Skin:  Intact without suspicious lesions or rashes  callus on R lateral foot -is covered by pads - have been tx by podiatry  Cervical Nodes:  No lymphadenopathy noted Inguinal Nodes:  No significant adenopathy Psych:  normal affect, talkative and pleasant     Diabetes Management Exam:    Foot Exam (with socks and/or shoes not present):       Sensory-Pinprick/Light touch:          Left medial foot (L-4): normal          Left dorsal foot (L-5): normal          Left lateral foot (S-1): normal          Right medial foot (L-4): normal          Right dorsal foot (L-5): normal          Right lateral foot (S-1): normal       Sensory-Monofilament:          Left foot: normal          Right foot: normal       Sensory-other: corns noted       Inspection:          Left foot: normal          Right foot: normal       Nails:          Left foot: normal          Right foot: normal   Impression & Recommendations:  Problem # 1:  PURE HYPERCHOLESTEROLEMIA (ICD-272.0) Assessment Improved  very good control - with LDL almost to goal with diet  rev low sat fat diet exercise when able Orders: Prescription Created Electronically [phone])  Labs Reviewed: SGOT: 20 (11/03/2009)   SGPT: 19 (11/03/2009)   HDL:75.10 (11/03/2009), 57.8 (10/16/2008)  LDL:73 (11/03/2009), 91 (10/16/2008)  Chol:166 (11/03/2009), 167 (10/16/2008)  Trig:89.0 (11/03/2009), 93 (10/16/2008)  Problem # 2:  OSTEOPOROSIS (ICD-733.00) Assessment: Comment Only pend notes from ortho- vert fx - getting better  disc further at f/u Her updated medication list for this problem includes:    Caltrate 600 1500 Mg Tabs (Calcium carbonate) .Marland Kitchen..Marland Kitchen Two by mouth daily  Problem # 3:  HYPERTENSION (ICD-401.9) Assessment: Unchanged  bp very well controlled cannot take ace/arb due to cough  no changes  med updated Her updated medication list for this problem includes:    Diltiazem Hcl Cr 240 Mg Cp24 (Diltiazem hcl) .Marland Kitchen... 1 by mouth once daily    Hydrochlorothiazide 50 Mg Tabs (Hydrochlorothiazide) .Marland Kitchen... 1 by mouth once daily  Orders: Prescription Created Electronically [phone])  BP today: 118/68 Prior BP: 120/70 (10/14/2009)  Labs Reviewed: K+: 4.1 (11/03/2009) Creat: : 0.9 (11/03/2009)    Chol: 166 (11/03/2009)   HDL: 75.10 (11/03/2009)   LDL: 73 (11/03/2009)   TG: 89.0 (11/03/2009)  Problem # 4:  DIABETES MELLITUS, TYPE II (ICD-250.00) Assessment: Unchanged overall stable - AIC persists under 6.5  disc healthy diet (low simple sugar/ choose complex carbs/ low sat fat) diet and exercise in detail  opthy up to date will continue pod f/u with painful corns  plan lab and f/u in 6 mo  Her updated medication list  for this problem includes:    Adult Aspirin Low Strength 81 Mg Tbdp (Aspirin) ..... One by mouth daily  Orders: Prescription Created Electronically [phone])  Complete Medication List: 1)  Omeprazole 20 Mg Cpdr (Omeprazole) .Marland Kitchen.. 1 by mouth each am 2)  Diltiazem Hcl Cr 240 Mg Cp24 (Diltiazem hcl) .Marland Kitchen.. 1 by mouth once daily 3)  Hydrochlorothiazide 50 Mg Tabs (Hydrochlorothiazide) .Marland Kitchen.. 1 by mouth once daily 4)  Adult Aspirin Low Strength 81 Mg Tbdp (Aspirin) .... One by mouth daily 5)  Tylenol Arthritis Pain 650 Mg Tbcr (Acetaminophen) .... 2 by mouth twice a day 6)  Caltrate 600 1500 Mg Tabs (Calcium carbonate) .... Two by mouth daily 7)  Ropinirole Hcl 1 Mg Tabs (Ropinirole hcl) .... One by mouth daily 8)  Stool Softner  .... 2 daily 9)  Mastectomy Prosthesis  .... To use as directed  v10.1 prior breast cancer 10)  Citrucel Powd (Methylcellulose (laxative)) .... Once a day 11)  Multivitamins Tabs (Multiple vitamin) .... Take 1 tablet by mouth once a day 12)  Hydrocortisone Valerate 0.2 % Crea (Hydrocortisone valerate) .... Apply to aff area in small amount once daily as needed 13)  Tums 500 Mg Chew (Calcium carbonate antacid) .... Chew two tums daily  Patient Instructions: 1)  keep working on healthy diet and exercise  2)  no change in medicines  3)  I [facility]  4)  schedule fasting labs and then f/u in 6 months lipid/ast/alt/renal /AIC/ cbc with diff and tsh 401.1, 250.0, 272  Prescriptions: HYDROCHLOROTHIAZIDE 50 MG TABS (HYDROCHLOROTHIAZIDE) 1 by  mouth once daily  #90 x 3   Entered and Authorized by:   Judith Part MD   Signed by:   Judith Part MD on 11/07/2009   Method used:   Electronically to        [facility] [address] (retail)       [address]       [place], [place]  [address]       Ph: [phone]       Fax: [phone]   RxID:   [id number] DILTIAZEM HCL CR 240 MG CP24 (DILTIAZEM HCL) 1 by mouth once daily  #90 x 3   Entered and Authorized by:   Judith Part MD   Signed by:   Judith Part MD on 11/07/2009   Method used:   Electronically to        [facility] [address] (retail)       [address]       [place], [place]  [address]       Ph: [phone]       Fax: [phone]   RxID:   [id number] OMEPRAZOLE 20 MG CPDR (OMEPRAZOLE) 1 by mouth each am  #90 x 3   Entered and Authorized by:   Judith Part MD   Signed by:   Judith Part MD on 11/07/2009   Method used:   Electronically to        [facility] [address] (retail)       [address], [address]       [place], [place]  [address]       Ph: [phone]       Fax: [phone]   RxID:  [id number]   Current Allergies (reviewed today): ! BIAXIN CODEINE SULFA TETRACYCLINE AUGMENTIN

## 2010-10-13 NOTE — Progress Notes (Signed)
Summary: form for diabetic supplies  Phone Note From Pharmacy   Caller: [facility] Summary of Call: Form for diabetic supplies is on your desk. Dr. Royden Purl pt. Initial call taken by: Lowella Petties CMA,  March 02, 2010 3:07 PM  Follow-up for Phone Call        In my box. Ruthe Mannan MD  March 02, 2010 3:39 PM  Form faxed to [facility] at [phone]. Follow-up by: Linde Gillis CMA Duncan Dull),  March 02, 2010 3:41 PM

## 2010-10-13 NOTE — Progress Notes (Signed)
Summary: wants referral to ortho  Phone Note Call from Patient Call back at Home Phone [phone]   Caller: Patient Call For: Judith Part MD Summary of Call: Pt says the medicine she is taking for her back, mobic, isnt helping at all.  She would like referral to an orthopedic, she has seen Dr. Ophelia Charter in the past.  Please advise.  Initial call taken by: Lowella Petties CMA,  October 23, 2009 3:03 PM  Follow-up for Phone Call        will do ref and route to [facility]  Follow-up by: Judith Part MD,  October 23, 2009 4:18 PM  Additional Follow-up for Phone Call Additional follow up Details #1::        Patient notified as instructed by telephone. Pt will wait to hear from [facility]. Lewanda Rife LPN  October 23, 2009 4:23 PM     Additional Follow-up for Phone Call Additional follow up Details #2::    Appt made with Dr Ophelia Charter on 11/04/2009 at 2:45pm. Carlton Adam  October 24, 2009 12:37 PM  Follow-up by: Carlton Adam,  October 24, 2009 12:37 PM

## 2010-10-13 NOTE — Letter (Signed)
Summary: [facility]   Imported By: Lanelle Bal 10/20/2009 12:01:54  _____________________________________________________________________  External Attachment:    Type:   Image     Comment:   External Document

## 2010-10-13 NOTE — Progress Notes (Signed)
Summary: BIAXIN XL 500 MG XR24H-TAB (CLARITHROMYCIN)  Phone Note Call from Patient Call back at [facility] Phone [phone]   Caller: Patient Call For: [name]  Summary of Call: PATIENT WAS SEEN YESTERDAY AND WAS GIVEN BIAXIN XL 500 MG XR24H-TAB (CLARITHROMYCIN). SHE SAYS THAT AFTER TAKING THE MEDICATION SHE HAS FELT NAUSEATED AND SHE FEELS THAT THE MEDICATION IS WHAT MADE HER FEEL THIS WAY. WANTS TO KNOW IF SOMETHING ELSE CAN BE CALLED IN FOR HER TO [facility].  Initial call taken by: Melody Comas,  September 18, 2009 10:19 AM  Follow-up for Phone Call        new Rx ssent to drug store, take as directed on bottle   Madison Tyler Deis FNP  September 18, 2009 2:11 PM   Patient notified as instructed by telephone. Medication phoned to [facility] as instructed. Called [facility] and cancelled rx that was sent electronically. Lewanda Rife LPN  September 18, 2009 3:27 PM     New/Updated Medications: ZITHROMAX 250 MG TABS (AZITHROMYCIN) take 2 at supper tonight, then 1 at supper for 4 nights Prescriptions: ZITHROMAX 250 MG TABS (AZITHROMYCIN) take 2 at supper tonight, then 1 at supper for 4 nights  #6 x 0   Entered and Authorized by:   Gildardo Griffes FNP   Signed by:   Gildardo Griffes FNP on 09/18/2009   Method used:   Electronically to        [facility] [address] ([facility]ail)       [address], [address]       [place], [place]  [address]       Ph: [phone]       Fax: [phone]   RxID:   [id number]

## 2010-10-13 NOTE — Progress Notes (Signed)
Summary: having back pain   Phone Note Call from Patient Call back at Home Phone [phone]   Caller: Patient Call For: Judith Part MD Summary of Call: Patient called and said that she woke up this morning with back pain and is having hard time getting around. She said that she is bringing her brother in today at 3:30 to see Dr. Hetty Ely and wanted to know if she could be seen around that time. I told her that you did not have any appointments around that time, but  I could get her in with Dr. Dayton Martes at 3:30 and she said that it would not be a good time for her because she wants to be in with her brother at that time. I offered to set up something for her tomorrow and she said that she can't wait that long. Please advise.  Initial call taken by: Melody Comas,  October 06, 2009 10:58 AM  Follow-up for Phone Call        I think she is going to have to see Dr Clifton Custard at 3:30 and just do the best that she can -- chances are that one of them will be a little behind and will be able to coordinate appts-- if not let me know Stann Mainland see how my schedule looks at that moment  Follow-up by: Judith Part MD,  October 06, 2009 11:22 AM  Additional Follow-up for Phone Call Additional follow up Details #1::        There are no longer any appts available.  Pt is asking for something to be called to [facility].  She is asking for an anti inflammatory or muscle relaxer.  let me know if any numbness or weakness f/u when she can get appt  px written on EMR for call in - flexeril- use caution of sedation Additional Follow-up by: Lowella Petties CMA,  October 06, 2009 12:37 PM    Additional Follow-up for Phone Call Additional follow up Details #2::    Med called to [facility], advised pt. Follow-up by: Lowella Petties CMA,  October 06, 2009 2:12 PM  New/Updated Medications: FLEXERIL 10 MG TABS (CYCLOBENZAPRINE HCL) 1/2 -1 by mouth up to every 8 hours as needed severe back pain watch for  sedation Prescriptions: FLEXERIL 10 MG TABS (CYCLOBENZAPRINE HCL) 1/2 -1 by mouth up to every 8 hours as needed severe back pain watch for sedation  #30 x 0   Entered and Authorized by:   Judith Part MD   Signed by:   Lowella Petties CMA on 10/06/2009   Method used:   Telephoned to ...       [facility] [address]       [address]       [place], [place]  [address]       Ph: [phone]       Fax: [phone]   RxID:   [id number]

## 2010-10-13 NOTE — Assessment & Plan Note (Signed)
Summary: FLU VACCINE/ TOWER   Nurse Visit   Allergies: 1)  ! Biaxin 2)  Codeine 3)  Sulfa 4)  Tetracycline 5)  Augmentin  Orders Added: 1)  Flu Vaccine 31yrs + MEDICARE PATIENTS [Q2039] 2)  Administration Flu vaccine - MCR [G0008]    Flu Vaccine Consent Questions     Do you have a history of severe allergic reactions to this vaccine? no    Any prior history of allergic reactions to egg and/or gelatin? no    Do you have a sensitivity to the preservative Thimersol? no    Do you have a past history of Guillan-Barre Syndrome? no    Do you currently have an acute febrile illness? no    Have you ever had a severe reaction to latex? no    Vaccine information given and explained to patient? yes    Are you currently pregnant? no    Lot Number:[id number]   Exp Date:03/13/2011   Manufacturer: [employer]    Site Given  Left Deltoid IM

## 2010-10-13 NOTE — Assessment & Plan Note (Signed)
Summary: 6 month follow up/rbh   Vital Signs:  Patient profile:   77 year old female Height:      60 inches Weight:      162.25 pounds BMI:     31.80 Temp:     97.9 degrees F oral Pulse rate:   84 / minute Pulse rhythm:   regular BP sitting:   120 / 64  (left arm) Cuff size:   regular  Vitals Entered By: Lewanda Rife LPN (May 11, 2010 3:13 PM) CC: six month f/u after labs   History of Present Illness: here for f/u of hyperglycemia / HTN / lipids   has been feeling ok overall   a lot of sinus drainage - has to spit it out  is not convenient also her nose runs    wt is down 4 lb has been going to weight watchers meetings  lost 8 lb total -- by her scale  is eating fruits and veggies   HTN in good control with 120/64  sugar control stable with AIC 6.4- same as last time opthy-- thinks last one was may 20th - was ok  , dr Sherryle Lis -- was 20/20   lipids are up a bit but still well controlled with HDL of 60 and LDL 83 (was in the 70s)  dexa- had one back in the spring with Dr Ophelia Charter -- he put her on fosamax generic   Allergies: 1)  ! Biaxin 2)  Codeine 3)  Sulfa 4)  Tetracycline 5)  Augmentin  Past History:  Past Medical History: Last updated: 11/07/2009 Breast cancer, hx of 12/99 Hypertension GERD Diabetes mellitus, type II 12805 all rhinitis OP incontinence  chronic cough spinal comp fx  Past Surgical History: Last updated: 05/02/2009 Hysterectomy/TAH - BSO - ENDOMETRIAL CA Mastectomy, L - BREAST CA DEXA 1994 DEXA LS OSTEOPOROSIS 2/00 DEXA - SAME, OP, LS, SPINE, HIP 2/02 DEXA - BMD SPINE, LOW BMD HIP 3/04 COLONOSCOPY, DIVERTICULOSIS, HEMORRHOIDS 3/02 BACK SURGERY (DR. Ophelia Charter) DISK 2007 FOCUS - NEG 8/98 10/08 removal of basal cell skin cancer 09 CCY 1/09 gamma imaging [place]- ok cataract surg 7/10  Family History: Last updated: 11/29/2006 BROTHER WITH COLON CA 12/04  Social History: Last updated: 04/22/2008 non smoker no alcohol  goes to  [facility] 3 days per week for exercise   Risk Factors: Smoking Status: never (11/29/2006)  Review of Systems General:  Denies fatigue, loss of appetite, and malaise. Eyes:  Denies blurring and eye irritation. CV:  Denies chest pain or discomfort, lightheadness, and palpitations. Resp:  Denies cough and shortness of breath. GI:  Denies abdominal pain, change in bowel habits, and indigestion. GU:  Denies discharge, dysuria, and urinary frequency. MS:  Denies muscle aches and cramps. Derm:  Denies itching, lesion(s), poor wound healing, and rash. Neuro:  Denies numbness and tingling. Psych:  Denies anxiety and depression. Endo:  Denies cold intolerance, excessive thirst, excessive urination, and heat intolerance. Heme:  Denies abnormal bruising and bleeding.  Physical Exam  General:  overweight but generally well appearing  Head:  normocephalic, atraumatic, and no abnormalities observed.   Eyes:  vision grossly intact, pupils equal, pupils round, and pupils reactive to light.  no conjunctival pallor, injection or icterus  Mouth:  pharynx pink and moist.   Neck:  supple with full rom and no masses or thyromegally, no JVD or carotid bruit  Chest Wall:  No deformities, masses, or tenderness noted. Lungs:  Normal respiratory effort, chest expands symmetrically. Lungs are clear to  auscultation, no crackles or wheezes. Heart:  Normal rate and regular rhythm. S1 and S2 normal without gallop, murmur, click, rub or other extra sounds. Abdomen:  Bowel sounds positive,abdomen soft and non-tender without masses, organomegaly or hernias noted. no renal bruits  Msk:  No deformity or scoliosis noted of thoracic or lumbar spine.  no acute joint changes  Pulses:  R and L carotid,radial,femoral,dorsalis pedis and posterior tibial pulses are full and equal bilaterally Extremities:  No clubbing, cyanosis, edema, or deformity noted with normal full range of motion of all joints.   Neurologic:  sensation intact  to light touch, gait normal, and DTRs symmetrical and normal.   Skin:  Intact without suspicious lesions or rashes Cervical Nodes:  No lymphadenopathy noted Inguinal Nodes:  No significant adenopathy Psych:  normal affect, talkative and pleasant    Impression & Recommendations:  Problem # 1:  PURE HYPERCHOLESTEROLEMIA (ICD-272.0) Assessment Unchanged  good control with low sat fat diet  rev labs with pt rev low sat fat diet  lab and f/u 6 mo   Labs Reviewed: SGOT: 21 (05/04/2010)   SGPT: 21 (05/04/2010)   HDL:60.90 (05/04/2010), 75.10 (11/03/2009)  LDL:83 (05/04/2010), 73 (11/03/2009)  Chol:162 (05/04/2010), 166 (11/03/2009)  Trig:92.0 (05/04/2010), 89.0 (11/03/2009)  Problem # 2:  HYPERGLYCEMIA (ICD-790.29) Assessment: Unchanged  stable with AIC steady at 6.4 opthy up to date rev diet- enc further wt loss with wt watchers   Labs Reviewed: Creat: 0.9 (05/04/2010)     Last Eye Exam: normal (01/23/2009)  Problem # 3:  HYPERTENSION (ICD-401.9) Assessment: Unchanged  well controlled on current med no ace due to cough  lab and f/u 6 mo  enc more exercise  Her updated medication list for this problem includes:    Diltiazem Hcl Cr 240 Mg Cp24 (Diltiazem hcl) .Marland Kitchen... 1 by mouth once daily    Hydrochlorothiazide 50 Mg Tabs (Hydrochlorothiazide) .Marland Kitchen... 1 by mouth once daily  BP today: 120/64 Prior BP: 118/68 (11/07/2009)  Labs Reviewed: K+: 3.8 (05/04/2010) Creat: : 0.9 (05/04/2010)   Chol: 162 (05/04/2010)   HDL: 60.90 (05/04/2010)   LDL: 83 (05/04/2010)   TG: 92.0 (05/04/2010)  Problem # 4:  OSTEOPOROSIS (ICD-733.00) Assessment: Deteriorated per pt was worse this check (sent for last dexa from Dr Ophelia Charter) is doing well with fosamax will check D level with next labs  The following medications were removed from the medication list:    Caltrate 600 1500 Mg Tabs (Calcium carbonate) .Marland Kitchen..Marland Kitchen Two by mouth daily Her updated medication list for this problem includes:    Vitamin D  400 Unit Tabs (Cholecalciferol) .Marland Kitchen..Marland Kitchen Two tablets by mouth daily    Fosamax 70 Mg Tabs (Alendronate sodium) .Marland Kitchen... 1 by mouth once weekly  Complete Medication List: 1)  Omeprazole 20 Mg Cpdr (Omeprazole) .Marland Kitchen.. 1 by mouth each am 2)  Diltiazem Hcl Cr 240 Mg Cp24 (Diltiazem hcl) .Marland Kitchen.. 1 by mouth once daily 3)  Hydrochlorothiazide 50 Mg Tabs (Hydrochlorothiazide) .Marland Kitchen.. 1 by mouth once daily 4)  Adult Aspirin Low Strength 81 Mg Tbdp (Aspirin) .... One by mouth daily 5)  Tylenol Arthritis Pain 650 Mg Tbcr (Acetaminophen) .... 2 by mouth twice a day 6)  Ropinirole Hcl 1 Mg Tabs (Ropinirole hcl) .... One by mouth daily 7)  Stool Softner  .... 2 daily 8)  Mastectomy Prosthesis  .... To use as directed  v10.1 prior breast cancer 9)  Citrucel Powd (Methylcellulose (laxative)) .... Once a day 10)  Multivitamins Tabs (Multiple vitamin) .... Take 1 tablet by  mouth once a day 11)  Hydrocortisone Valerate 0.2 % Crea (Hydrocortisone valerate) .... Apply to aff area in small amount once daily as needed 12)  Tums 500 Mg Chew (Calcium carbonate antacid) .... Chew two tums daily 13)  Vitamin D 400 Unit Tabs (Cholecalciferol) .... Two tablets by mouth daily 14)  Fosamax 70 Mg Tabs (Alendronate sodium) .Marland Kitchen.. 1 by mouth once weekly  Patient Instructions: 1)  try claritin over the counter 10 mg daily  2)  if that does not work for post nasal drip - try allegra or zyrtec  3)  I think at least one of these should help  4)  please send for last dexa from Dr Ophelia Charter  5)  no change in medicines  6)  blood pressure and labs are stable  7)  keep working on weight loss  8)  schedule fasting labs in 6 months and then follow up -- lipid/ast/alt/AIC / renal / vitamin D level 272, 733.0 and hyperglycemia   Current Allergies (reviewed today): ! BIAXIN CODEINE SULFA TETRACYCLINE AUGMENTIN

## 2010-10-13 NOTE — Assessment & Plan Note (Signed)
Summary: COUGH,CONGESTON/CLE   Vital Signs:  Patient profile:   77 year old female Weight:      169 pounds Temp:     98.1 degrees F oral Pulse rate:   92 / minute Pulse rhythm:   regular BP sitting:   160 / 74  (left arm) Cuff size:   regular  Vitals Entered By: Lowella Petties CMA (September 24, 2009 3:43 PM) CC: Cough and congestion   History of Present Illness: was here for bronchitis earlier this month - for over 8 days  she was given abx- biaxin and did not tolerate- had to change to zpak and finished all of it  nose is really congsted -- nasal d/c is white to clear   coughing up phlegm - also clear to white   runny nose is improved - but still has to blow  cough is in spells -- gets better and then worse --tussionex does help some  lost her voice monday  no sore throat or ear pain   no fever last wees - no chills or aches   needs px for breast prosthesis - to [facility] med supply (they lost her last one )  has sore spot on R foot - cannot fit well in shoes about a month    Allergies: 1)  ! Biaxin 2)  Codeine 3)  Sulfa 4)  Tetracycline 5)  Augmentin  Past History:  Past Medical History: Last updated: 12/06/2008 Breast cancer, hx of 12/99 Hypertension GERD Diabetes mellitus, type II 12805 all rhinitis OP incontinence  chronic cough  Past Surgical History: Last updated: 05/02/2009 Hysterectomy/TAH - BSO - ENDOMETRIAL CA Mastectomy, L - BREAST CA DEXA 1994 DEXA LS OSTEOPOROSIS 2/00 DEXA - SAME, OP, LS, SPINE, HIP 2/02 DEXA - BMD SPINE, LOW BMD HIP 3/04 COLONOSCOPY, DIVERTICULOSIS, HEMORRHOIDS 3/02 BACK SURGERY (DR. Ophelia Charter) DISK 2007 FOCUS - NEG 8/98 10/08 removal of basal cell skin cancer 09 CCY 1/09 gamma imaging [place]- ok cataract surg 7/10  Family History: Last updated: 11/29/2006 BROTHER WITH COLON CA 12/04  Social History: Last updated: 04/22/2008 non smoker no alcohol  goes to [facility] 3 days per week for exercise   Risk  Factors: Smoking Status: never (11/29/2006)  Review of Systems General:  Complains of fatigue and malaise; denies chills and fever. Eyes:  Denies blurring and eye irritation. ENT:  Complains of hoarseness, nasal congestion, and postnasal drainage; denies earache. CV:  Denies chest pain or discomfort and palpitations. Resp:  Complains of cough and sputum productive; denies pleuritic and shortness of breath. GI:  Denies diarrhea, nausea, and vomiting. Derm:  Denies poor wound healing.  Physical Exam  General:  alert, well-developed, well-nourished, and well-hydrated.  NAD Head:  normocephalic, atraumatic, and no abnormalities observed.  no sinus tenderness  Eyes:  vision grossly intact, pupils equal, pupils round, pupils reactive to light, and no injection.   Ears:  R ear normal and L ear normal.   Nose:  nares boggy with clear rhinorrhea Mouth:  pharynx pink and moist, no erythema, and no exudates.   Neck:  supple with full rom and no masses or thyromegally, no JVD or carotid bruit  Chest Wall:  No deformities, masses, or tenderness noted. Lungs:  CTA with good air exch harsh bs at bases - few isolated rhonchi  no rales  diffuse exp wheezes without prolonged exp phase  Heart:  Normal rate and regular rhythm. S1 and S2 normal without gallop, murmur, click, rub or other extra sounds. Skin:  prominent  corn lat R foot over lateral bunion this area sterilly cleansed and then shaved with #11 scalpel  pt tolerated well  dressed with bandaid and abx oint  Cervical Nodes:  No lymphadenopathy noted Psych:  normal affect, talkative and pleasant    Impression & Recommendations:  Problem # 1:  BRONCHITIS-ACUTE (ICD-466.0) Assessment Unchanged with some mild reactive airways  will repeat zpak for empiric coverage - as pt intol to many abx classes  pred taper - low dose (disc what to expect incl inc sugar )  update if not imp this week or if worse or fever or sob The following medications  were removed from the medication list:    Zithromax 250 Mg Tabs (Azithromycin) .Marland Kitchen... Take 2 at supper tonight, then 1 at supper for 4 nights Her updated medication list for this problem includes:    Tussionex Pennkinetic Er 8-10 Mg/32ml Lqcr (Chlorpheniramine-hydrocodone) .Marland Kitchen... Take 1 tsp at bedtime for cough    Zithromax Z-pak 250 Mg Tabs (Azithromycin) .Marland Kitchen... Take by mouth as directed  Problem # 2:  CORNS AND CALLOSITIES (ICD-700) Assessment: New corn shaved off of R lateral foot with relief   Complete Medication List: 1)  Omeprazole 20 Mg Cpdr (Omeprazole) .Marland Kitchen.. 1 by mouth each am 2)  Diltiazem Hcl Cr 240 Mg Cp24 (Diltiazem hcl) .Marland Kitchen.. 1 by mouth once daily 3)  Hydrochlorothiazide 50 Mg Tabs (Hydrochlorothiazide) .Marland Kitchen.. 1 by mouth once daily 4)  Adult Aspirin Low Strength 81 Mg Tbdp (Aspirin) .... One by mouth daily 5)  Tylenol Arthritis Pain 650 Mg Tbcr (Acetaminophen) .... 2 by mouth twice a day 6)  Caltrate 600 1500 Mg Tabs (Calcium carbonate) .... Two by mouth daily 7)  Ropinirole Hcl 1 Mg Tabs (Ropinirole hcl) .... One by mouth daily 8)  Stool Softner  .... 2 daily 9)  Mastectomy Prosthesis  .... To use as directed  v10.1 prior breast cancer 10)  Citrucel Powd (Methylcellulose (laxative)) .... Once a day 11)  Multivitamins Tabs (Multiple vitamin) .... Take 1 tablet by mouth once a day 12)  Hydrocortisone Valerate 0.2 % Crea (Hydrocortisone valerate) .... Apply to aff area in small amount once daily as needed 13)  Tussionex Pennkinetic Er 8-10 Mg/6ml Lqcr (Chlorpheniramine-hydrocodone) .... Take 1 tsp at bedtime for cough 14)  Zithromax Z-pak 250 Mg Tabs (Azithromycin) .... Take by mouth as directed 15)  Prednisone 10 Mg Tabs (Prednisone) .... Take by mouth as directed  Patient Instructions: 1)  take prednisone as directed -- know this can make you hyper and hungry - also will increase sugar temporarily  2)  prednisone take as follows  3)  take 3 pills once daily for 3 days then  4)  2  pills once daily for 3 days then  5)  1 pill once daily for 3 days and then stop  6)  take the zithromax as directed 7)  update me if you do not improve or if symptoms worsen 8)  keep corn on foot clean and dry - and update me if pain does not improve  Prescriptions: PREDNISONE 10 MG TABS (PREDNISONE) take by mouth as directed  #18 x 0   Entered and Authorized by:   Judith Part MD   Signed by:   Judith Part MD on 09/24/2009   Method used:   Print then Give to Patient   RxID:   [id number] ZITHROMAX Z-PAK 250 MG TABS (AZITHROMYCIN) take by mouth as directed  #1 pack x 0   Entered and  Authorized by:   Judith Part MD   Signed by:   Judith Part MD on 09/24/2009   Method used:   Print then Give to Patient   RxID:   [id number] MASTECTOMY PROSTHESIS to use as directed  v10.1 prior breast cancer  #1 x 3   Entered and Authorized by:   Judith Part MD   Signed by:   Judith Part MD on 09/24/2009   Method used:   Printed then faxed to ...       [facility] [address]* (retail)       [address], [address]       [place], [place]  [address]       Ph: [phone]       Fax: [phone]   RxID:   [id number]   Prior Medications (reviewed today): OMEPRAZOLE 20 MG CPDR (OMEPRAZOLE) 1 by mouth each am DILTIAZEM HCL CR 240 MG CP24 (DILTIAZEM HCL) 1 by mouth once daily HYDROCHLOROTHIAZIDE 50 MG TABS (HYDROCHLOROTHIAZIDE) 1 by mouth once daily ADULT ASPIRIN LOW STRENGTH 81 MG  TBDP (ASPIRIN) one by mouth daily TYLENOL ARTHRITIS PAIN 650 MG  TBCR (ACETAMINOPHEN) 2 by mouth twice a day CALTRATE 600 1500 MG  TABS (CALCIUM CARBONATE) two by mouth daily ROPINIROLE HCL 1 MG  TABS (ROPINIROLE HCL) one by mouth daily STOOL SOFTNER () 2 daily CITRUCEL  POWD (METHYLCELLULOSE (LAXATIVE)) once a day MULTIVITAMINS  TABS (MULTIPLE VITAMIN) Take 1 tablet by mouth once a day HYDROCORTISONE VALERATE 0.2 % CREA (HYDROCORTISONE VALERATE) apply to aff  area in small amount once daily as needed TUSSIONEX PENNKINETIC ER 8-10 MG/5ML LQCR (CHLORPHENIRAMINE-HYDROCODONE) take 1 tsp at bedtime for cough Current Allergies: ! BIAXIN CODEINE SULFA TETRACYCLINE AUGMENTIN

## 2010-10-15 NOTE — Assessment & Plan Note (Signed)
Summary: 2:30 LOW BACK PAIN/CLE   Vital Signs:  Patient profile:   77 year old female Weight:      163.25 pounds Temp:     98.3 degrees F oral Pulse rate:   80 / minute Pulse rhythm:   regular BP sitting:   128 / 64  (right arm) Cuff size:   regular  Vitals Entered By: Selena Batten Dance CMA Duncan Dull) (September 08, 2010 2:32 PM) CC: LBP   History of Present Illness: CC: LBP  2d h/o lower back pain relieved by advil.  LBP, middle and right sided.  No radiation.  Worse with stooping or squatting down.    No fevers/chills, radiculopathy down legs, saddle anesthesia, bowel/bladder accidents.  Denies injury to back or falls.  h/o osteoporosis, in chart h/o fracture of back but pt denies.  on fosamax weekly, vit D, tums.  Current Medications (verified): 1)  Omeprazole 20 Mg Cpdr (Omeprazole) .Marland Kitchen.. 1 By Mouth Each Am 2)  Diltiazem Hcl Cr 240 Mg Cp24 (Diltiazem Hcl) .Marland Kitchen.. 1 By Mouth Once Daily 3)  Hydrochlorothiazide 50 Mg Tabs (Hydrochlorothiazide) .Marland Kitchen.. 1 By Mouth Once Daily 4)  Adult Aspirin Low Strength 81 Mg  Tbdp (Aspirin) .... One By Mouth Daily 5)  Tylenol Arthritis Pain 650 Mg  Tbcr (Acetaminophen) .... 2 By Mouth Twice A Day 6)  Ropinirole Hcl 1 Mg  Tabs (Ropinirole Hcl) .... One By Mouth Daily 7)  Stool Softner .... 2 Daily 8)  Mastectomy Prosthesis .... To Use As Directed  V10.1 Prior Breast Cancer 9)  Citrucel  Powd (Methylcellulose (Laxative)) .... Once A Day 10)  Multivitamins  Tabs (Multiple Vitamin) .... Take 1 Tablet By Mouth Once A Day 11)  Hydrocortisone Valerate 0.2 % Crea (Hydrocortisone Valerate) .... Apply To Aff Area in Small Amount Once Daily As Needed 12)  Tums 500 Mg Chew (Calcium Carbonate Antacid) .... Chew Two Tums Daily 13)  Vitamin D 400 Unit  Tabs (Cholecalciferol) .... Two Tablets By Mouth Daily 14)  Fosamax 70 Mg Tabs (Alendronate Sodium) .Marland Kitchen.. 1 By Mouth Once Weekly  Allergies: 1)  ! Biaxin 2)  Codeine 3)  Sulfa 4)  Tetracycline 5)  Augmentin  Past  History:  Past Medical History: Last updated: 11/07/2009 Breast cancer, hx of 12/99 Hypertension GERD Diabetes mellitus, type II 12805 all rhinitis OP incontinence  chronic cough spinal comp fx  Social History: Last updated: 04/22/2008 non smoker no alcohol  goes to [facility] 3 days per week for exercise   Review of Systems       per HPI  Physical Exam  General:  overweight but generally well appearing  Msk:  no midline spine tenderness or paraspinous mm tenderness.  + R SI joint pain with palpation.  no GTB pain.  neg SLR bilaterally.  no significant pain with faber bilaterally. Extremities:  No clubbing, cyanosis, edema, or deformity noted with normal full range of motion of all joints.   Neurologic:  sensation intact to light touch, gait normal   Impression & Recommendations:  Problem # 1:  BACK PAIN (ICD-724.5) sacroiliitis vs lumbar strain.  treat conservatively with ice, low dose NSAIDs, rest.  stretching exercises provided (SIitis handout provided).  Discussed use of moist heat or ice, modified activities, medications, and stretching/strengthening exercises. Back care instructions given. To be seen in 2 weeks if no improvement; sooner if worsening of symptoms.   Her updated medication list for this problem includes:    Adult Aspirin Low Strength 81 Mg Tbdp (Aspirin) ..... One  by mouth daily    Tylenol Arthritis Pain 650 Mg Tbcr (Acetaminophen) .Marland Kitchen... 2 by mouth twice a day  Complete Medication List: 1)  Omeprazole 20 Mg Cpdr (Omeprazole) .Marland Kitchen.. 1 by mouth each am 2)  Diltiazem Hcl Cr 240 Mg Cp24 (Diltiazem hcl) .Marland Kitchen.. 1 by mouth once daily 3)  Hydrochlorothiazide 50 Mg Tabs (Hydrochlorothiazide) .Marland Kitchen.. 1 by mouth once daily 4)  Adult Aspirin Low Strength 81 Mg Tbdp (Aspirin) .... One by mouth daily 5)  Tylenol Arthritis Pain 650 Mg Tbcr (Acetaminophen) .... 2 by mouth twice a day 6)  Ropinirole Hcl 1 Mg Tabs (Ropinirole hcl) .... One by mouth daily 7)  Stool Softner  .... 2  daily 8)  Mastectomy Prosthesis  .... To use as directed  v10.1 prior breast cancer 9)  Citrucel Powd (Methylcellulose (laxative)) .... Once a day 10)  Multivitamins Tabs (Multiple vitamin) .... Take 1 tablet by mouth once a day 11)  Hydrocortisone Valerate 0.2 % Crea (Hydrocortisone valerate) .... Apply to aff area in small amount once daily as needed 12)  Tums 500 Mg Chew (Calcium carbonate antacid) .... Chew two tums daily 13)  Vitamin D 400 Unit Tabs (Cholecalciferol) .... Two tablets by mouth daily 14)  Fosamax 70 Mg Tabs (Alendronate sodium) .Marland Kitchen.. 1 by mouth once weekly  Patient Instructions: 1)  sounds like either a lumbar strain or a bit of sacroiliitis - handout provided. 2)  rest, anti inflammatories (advil 1-2 pills every 6 hours for next 3-5 days then as needed), and ice 3)  if not better or worsening, please let us know. 4)  good to see you today, call clinic with questions.   Orders Added: 1)  Est. Patient Level III [[address]]    Current Allergies (reviewed today): ! BIAXIN CODEINE SULFA TETRACYCLINE AUGMENTIN

## 2010-11-06 ENCOUNTER — Telehealth (INDEPENDENT_AMBULATORY_CARE_PROVIDER_SITE_OTHER): Payer: Self-pay | Admitting: *Deleted

## 2010-11-06 ENCOUNTER — Encounter (INDEPENDENT_AMBULATORY_CARE_PROVIDER_SITE_OTHER): Payer: Self-pay | Admitting: *Deleted

## 2010-11-06 ENCOUNTER — Other Ambulatory Visit (INDEPENDENT_AMBULATORY_CARE_PROVIDER_SITE_OTHER): Payer: Medicare Other

## 2010-11-06 ENCOUNTER — Encounter: Payer: Self-pay | Admitting: Family Medicine

## 2010-11-06 ENCOUNTER — Other Ambulatory Visit: Payer: Self-pay | Admitting: Family Medicine

## 2010-11-06 DIAGNOSIS — R7309 Other abnormal glucose: Secondary | ICD-10-CM

## 2010-11-06 DIAGNOSIS — E7800 Pure hypercholesterolemia, unspecified: Secondary | ICD-10-CM

## 2010-11-06 DIAGNOSIS — M810 Age-related osteoporosis without current pathological fracture: Secondary | ICD-10-CM

## 2010-11-06 LAB — LIPID PANEL
HDL: 75.5 mg/dL (ref >39.00)
Total CHOL/HDL Ratio: 2

## 2010-11-06 LAB — RENAL FUNCTION PANEL
Albumin: 4.3 g/dL (ref 3.5–5.2)
GFR: 65.45 mL/min (ref >60.00)
Glucose, Bld: 95 mg/dL (ref 70–99)
Phosphorus: 3.7 mg/dL (ref 2.3–4.6)
Potassium: 4.1 mEq/L (ref 3.5–5.1)
Sodium: 138 mEq/L (ref 135–145)

## 2010-11-06 LAB — AST: AST: 20 U/L (ref 0–37)

## 2010-11-06 LAB — ALT: ALT: 19 U/L (ref 0–35)

## 2010-11-08 LAB — CONVERTED CEMR LAB: Vit D, 25-Hydroxy: 44 ng/mL (ref 30–89)

## 2010-11-10 NOTE — Progress Notes (Signed)
Summary: refill request for requip  Phone Note Refill Request Message from:  Fax from Pharmacy  Refills Requested: Medication #1:  ROPINIROLE HCL 1 MG  TABS one by mouth daily   Last Refilled: 09/13/2008 Faxed request from [facility], [phone].  Initial call taken by: Lowella Petties CMA, AAMA,  November 06, 2010 10:01 AM  Follow-up for Phone Call        px written on EMR for call in  Follow-up by: Judith Part MD,  November 06, 2010 11:29 AM  Additional Follow-up for Phone Call Additional follow up Details #1::        Rx called to pharmacy Additional Follow-up by: Liane Comber CMA Duncan Dull),  November 06, 2010 1:22 PM    Prescriptions: ROPINIROLE HCL 1 MG  TABS (ROPINIROLE HCL) one by mouth daily  #90 x 3   Entered and Authorized by:   Judith Part MD   Signed by:   Judith Part MD on 11/06/2010   Method used:   Telephoned to ...       [facility]* (retail)       [address]       [place], [place]  [address]       Ph: [phone]       Fax: [phone]   RxID:   [id number]

## 2010-11-11 ENCOUNTER — Encounter: Payer: Self-pay | Admitting: Family Medicine

## 2010-11-11 ENCOUNTER — Ambulatory Visit (INDEPENDENT_AMBULATORY_CARE_PROVIDER_SITE_OTHER): Payer: Medicare Other | Admitting: Family Medicine

## 2010-11-11 DIAGNOSIS — K219 Gastro-esophageal reflux disease without esophagitis: Secondary | ICD-10-CM

## 2010-11-11 DIAGNOSIS — R7309 Other abnormal glucose: Secondary | ICD-10-CM

## 2010-11-11 DIAGNOSIS — E7800 Pure hypercholesterolemia, unspecified: Secondary | ICD-10-CM

## 2010-11-11 DIAGNOSIS — M810 Age-related osteoporosis without current pathological fracture: Secondary | ICD-10-CM

## 2010-11-11 DIAGNOSIS — I10 Essential (primary) hypertension: Secondary | ICD-10-CM

## 2010-11-19 NOTE — Assessment & Plan Note (Signed)
Summary: F/U after labs//lfw   Vital Signs:  Patient profile:   77 year old female Height:      60 inches Weight:      164.50 pounds BMI:     32.24 Temp:     98 degrees F oral Pulse rate:   80 / minute Pulse rhythm:   regular BP sitting:   138 / 64  (right arm) Cuff size:   regular  Vitals Entered By: Lewanda Rife LPN (November 11, 2010 3:13 PM)  Serial Vital Signs/Assessments:  Time      Position  BP       Pulse  Resp  Temp     By                     130/60                         Judith Part MD  CC: six month f/u after labs   History of Present Illness: here for f/u of HTN and hyperglycemia and lipids and OP  feeling fine except for post nasal drainage  has tried claritin or zyrtec - no help   wt is stable with bmi of 32  138/64 first check today no ace due to cough  AIC is up from 6.4 to 6.5  lipids are well controlled with diet Last Lipid ProfileCholesterol: 179 (11/06/2010 8:29:05 AM)HDL:  75.50 (11/06/2010 8:29:05 AM)LDL:  92 (11/06/2010 8:29:05 AM)Triglycerides:  Last Liver profileSGOT:  20 (11/06/2010 8:29:05 AM)SPGT:  19 (11/06/2010 8:29:05 AM)T. Bili:  0.6 (09/06/2007 8:36:00 AM)Alk Phos:  68 (09/06/2007 8:36:00 AM)  went up just a little bit   is eating too many sweets lately  knows she should not  will try to get away from them  sugar at home 102 in am , and 120s in pm    vit D level ok at 44  is back on fosamax from orthopedics -- will be on and then off 6 mo and then dexa   Allergies: 1)  ! Biaxin 2)  Codeine 3)  Sulfa 4)  Tetracycline 5)  Augmentin  Past History:  Past Surgical History: Last updated: 05/02/2009 Hysterectomy/TAH - BSO - ENDOMETRIAL CA Mastectomy, L - BREAST CA DEXA 1994 DEXA LS OSTEOPOROSIS 2/00 DEXA - SAME, OP, LS, SPINE, HIP 2/02 DEXA - BMD SPINE, LOW BMD HIP 3/04 COLONOSCOPY, DIVERTICULOSIS, HEMORRHOIDS 3/02 BACK SURGERY (DR. Ophelia Charter) DISK 2007 FOCUS - NEG 8/98 10/08 removal of basal cell skin cancer 09  CCY 1/09 gamma imaging [place]- ok cataract surg 7/10  Family History: Last updated: 11/29/2006 BROTHER WITH COLON CA 12/04  Social History: Last updated: 04/22/2008 non smoker no alcohol  goes to [facility] 3 days per week for exercise   Risk Factors: Smoking Status: never (11/29/2006)  Past Medical History: Breast cancer, hx of 12/99 Hypertension GERD hyperglycemia  all rhinitis OP incontinence  chronic cough spinal comp fx  Review of Systems General:  Denies chills, fatigue, and fever. Eyes:  Denies blurring, discharge, and eye irritation. ENT:  Complains of postnasal drainage; denies sinus pressure and sore throat. CV:  Denies chest pain or discomfort and palpitations. Resp:  Denies cough, shortness of breath, and wheezing. GI:  Denies abdominal pain, change in bowel habits, and nausea. GU:  Denies urinary frequency. MS:  Denies muscle aches and cramps. Derm:  Denies itching, lesion(s), poor wound healing, and rash. Neuro:  Denies numbness and tingling. Psych:  Denies  anxiety and depression. Endo:  Denies cold intolerance, excessive thirst, excessive urination, and heat intolerance. Heme:  Denies abnormal bruising and bleeding.  Physical Exam  General:  overweight but generally well appearing  Head:  normocephalic, atraumatic, and no abnormalities observed.   Eyes:  vision grossly intact, pupils equal, pupils round, and pupils reactive to light.  no conjunctival pallor, injection or icterus  Nose:  nares are boggy Mouth:  pharynx pink and moist.   Neck:  supple with full rom and no masses or thyromegally, no JVD or carotid bruit  Chest Wall:  No deformities, masses, or tenderness noted. Lungs:  Normal respiratory effort, chest expands symmetrically. Lungs are clear to auscultation throughout, no crackles or wheezes. Heart:  Normal rate and regular rhythm. S1 and S2 normal without gallop, murmur, click, rub or other extra sounds. Abdomen:  Bowel sounds  positive,abdomen soft and non-tender without masses, organomegaly or hernias noted. no renal bruits  Msk:  No deformity or scoliosis noted of thoracic or lumbar spine.   Pulses:  R and L carotid,radial,femoral,dorsalis pedis and posterior tibial pulses are full and equal bilaterally Extremities:  No clubbing, cyanosis, edema, or deformity noted with normal full range of motion of all joints.   Neurologic:  sensation intact to light touch, gait normal Skin:  Intact without suspicious lesions or rashes Cervical Nodes:  No lymphadenopathy noted Inguinal Nodes:  No significant adenopathy Psych:  normal affect, talkative and pleasant    Impression & Recommendations:  Problem # 1:  PURE HYPERCHOLESTEROLEMIA (ICD-272.0) Assessment Deteriorated  this is generally good but up just a bit with worse diet rev labs with pt  rev low sat fat diet re check 6 mo  rev goals for chol  Labs Reviewed: SGOT: 20 (11/06/2010)   SGPT: 19 (11/06/2010)   HDL:75.50 (11/06/2010), 60.90 (05/04/2010)  LDL:92 (11/06/2010), 83 (05/04/2010)  Chol:179 (11/06/2010), 162 (05/04/2010)  Trig:58.0 (11/06/2010), 92.0 (05/04/2010)  Problem # 2:  OSTEOPOROSIS (ICD-733.00) Assessment: Unchanged back on fosamax from ortho  vit D level is nl - will continue current dose  Her updated medication list for this problem includes:    Vitamin D 400 Unit Tabs (Cholecalciferol) .Marland Kitchen..Marland Kitchen Two tablets by mouth daily    Fosamax 70 Mg Tabs (Alendronate sodium) .Marland Kitchen... 1 by mouth once weekly  Problem # 3:  HYPERTENSION (ICD-401.9) Assessment: Unchanged  better on 2nd check non tol to ace and arb refilled med exercise as tol Her updated medication list for this problem includes:    Diltiazem Hcl Cr 240 Mg Cp24 (Diltiazem hcl) .Marland Kitchen... 1 by mouth once daily    Hydrochlorothiazide 50 Mg Tabs (Hydrochlorothiazide) .Marland Kitchen... 1 by mouth once daily  BP today: 138/64-- r echeck 130/60 at rest  Prior BP: 128/64 (09/08/2010)  Labs Reviewed: K+: 4.1  (11/06/2010) Creat: : 0.9 (11/06/2010)   Chol: 179 (11/06/2010)   HDL: 75.50 (11/06/2010)   LDL: 92 (11/06/2010)   TG: 58.0 (11/06/2010)  BP today: 138/64 Prior BP: 128/64 (09/08/2010)  Labs Reviewed: K+: 4.1 (11/06/2010) Creat: : 0.9 (11/06/2010)   Chol: 179 (11/06/2010)   HDL: 75.50 (11/06/2010)   LDL: 92 (11/06/2010)   TG: 58.0 (11/06/2010)  Orders: Prescription Created Electronically [phone])  Problem # 4:  HYPERGLYCEMIA (ICD-790.29) Assessment: Deteriorated  AIC up just a bit disc low glycemic diet in detail lab 6 mo and f/u  asked to work on wt loss   Labs Reviewed: Creat: 0.9 (11/06/2010)     Last Eye Exam: normal (01/23/2009)  Complete Medication List: 1)  Omeprazole 20 Mg Cpdr (Omeprazole) .Marland Kitchen.. 1 by mouth each am 2)  Diltiazem Hcl Cr 240 Mg Cp24 (Diltiazem hcl) .Marland Kitchen.. 1 by mouth once daily 3)  Hydrochlorothiazide 50 Mg Tabs (Hydrochlorothiazide) .Marland Kitchen.. 1 by mouth once daily 4)  Adult Aspirin Low Strength 81 Mg Tbdp (Aspirin) .... One by mouth daily 5)  Tylenol Arthritis Pain 650 Mg Tbcr (Acetaminophen) .... 2 by mouth twice a day 6)  Ropinirole Hcl 1 Mg Tabs (Ropinirole hcl) .... One by mouth daily 7)  Stool Softner  .... 2 daily 8)  Mastectomy Prosthesis  .... To use as directed  v10.1 prior breast cancer 9)  Citrucel Powd (Methylcellulose (laxative)) .... Once a day 10)  Multivitamins Tabs (Multiple vitamin) .... Take 1 tablet by mouth once a day 11)  Hydrocortisone Valerate 0.2 % Crea (Hydrocortisone valerate) .... Apply to aff area in small amount once daily as needed 12)  Tums 500 Mg Chew (Calcium carbonate antacid) .... Chew three  tums daily 13)  Vitamin D 400 Unit Tabs (Cholecalciferol) .... Two tablets by mouth daily 14)  Fosamax 70 Mg Tabs (Alendronate sodium) .Marland Kitchen.. 1 by mouth once weekly  Patient Instructions: 1)  avoid sweets or sugar drinks 2)  diet beverages are ok  3)  small servings of fruit or sugar free jello are ok  4)  limit servings of starches  (bread/pasta/ rice/ potato)- eat brown instead of white  5)  schedule fasting lab and f/u in 6 months lipid/ast/alt/renal / AIC / for hyperglycemia and 401.1 and 272  Prescriptions: HYDROCHLOROTHIAZIDE 50 MG TABS (HYDROCHLOROTHIAZIDE) 1 by mouth once daily  #90 x 3   Entered and Authorized by:   Judith Part MD   Signed by:   Judith Part MD on 11/11/2010   Method used:   Electronically to        [facility]* (retail)       [address]       [place], [place]  [address]       Ph: [phone]       Fax: [phone]   RxID:   [id number] DILTIAZEM HCL CR 240 MG CP24 (DILTIAZEM HCL) 1 by mouth once daily  #90 x 3   Entered and Authorized by:   Judith Part MD   Signed by:   Judith Part MD on 11/11/2010   Method used:   Electronically to        [facility]* (retail)       [address]       [place], [place]  [address]       Ph: [phone]       Fax: [phone]   RxID:   [id number] OMEPRAZOLE 20 MG CPDR (OMEPRAZOLE) 1 by mouth each am  #90 x 3   Entered and Authorized by:   Judith Part MD   Signed by:   Judith Part MD on 11/11/2010   Method used:   Electronically to        [facility]* (retail)       [address]       [place], [place]  [address]       Ph: [phone]       Fax: [phone]   RxID:   [id number]    Orders Added: 1)  Prescription Created Electronically [G8553] 2)  Est. Patient Level IV [[address]]    Current Allergies (reviewed today): ! BIAXIN CODEINE SULFA TETRACYCLINE AUGMENTIN

## 2010-12-31 ENCOUNTER — Other Ambulatory Visit: Payer: Self-pay

## 2010-12-31 MED ORDER — HYDROCORTISONE VALERATE 0.2 % EX CREA: TOPICAL_CREAM | CUTANEOUS | Status: DC

## 2010-12-31 NOTE — Telephone Encounter (Signed)
Medication phoned to [facility] as instructed.  

## 2010-12-31 NOTE — Telephone Encounter (Signed)
Px written for call in   

## 2010-12-31 NOTE — Telephone Encounter (Signed)
[facility] called and requested rx on Hydrocortisone Valerate cream 0.2% instructions apply to affected area in small amount once daily as needed. #15 gm.Please advise.

## 2011-01-26 NOTE — Discharge Summary (Signed)
[name], [name]                   ACCOUNT NO.:  [id number]   MEDICAL RECORD NO.:  [id number]          PATIENT TYPE:  INP   LOCATION:  1509                         FACILITY:  [facility]   PHYSICIAN:  Titus Dubin. Hopper, MD,FACP,FCCPDATE OF BIRTH:  1928[birth year]09-21   DATE OF ADMISSION:  08/31/2007  DATE OF DISCHARGE:  09/02/2007                               DISCHARGE SUMMARY   ADMITTING DIAGNOSES:  1. Common bile duct stone with  transient  obstruction.  2. Hypertension.  3. Diabetes.   DISCHARGE DIAGNOSES:  Hepatic enzymes elevation  resolving, nausea and  pain resolved.   ADDITIONAL DIAGNOSES:  Include obstructive sleep apnea, history of  breast cancer, and esophageal reflux.   HOSPITAL COURSE:  The patient's pain resolved as of the evening of  December 18, and she had no further nausea and vomiting.   The hepatic enzymes revealed an SGT of 148 which dropped to 121.  The  initial SGPT was 144; but was 121 the day of discharge.  Her initial  SGOT also decreased on serial measurement. Her initial white count was  13,300.  Follow-up white count was 6,900.  No anemia was noted.  Her  amylase was normal x1 and lipase within normal limits x2.   The day of discharge, the patient was afebrile and denied any abdominal  pain or nausea/vomiting.  She was very anxious to go, stating I have  got many things to do before Christmas.   She was afebrile with respiratory rate of 20 and pulse 72.  Blood  pressure was elevated at 169/84; she was asymptomatic.  Serial glucoses  were less than 115.Her chest was clear; heart rhythm was regular.  Abdomen revealed normal bowel sounds and was nontender.  Specifically,  percussion over the right upper quadrant elicited no pain.  She  exhibited no jaundice or icterus.   The pathophysiology of gallstones & cholecystitis was discussed.   It was recommended that she see  a Development worker, international aid  as an outpatient for  a possible laparoscopic cholecystectomy.   Her  discharge status was improved.  It was recommended that she avoid  fried, fatty or greasy foods, limit  sodium.  She was to continue to  monitor her blood pressure, and to resume her lisinopril and  hydrochlorothiazide.   HISTORY AND PHYSICAL:  Listed her dose of hydrochlorothiazide of 50 mg  daily; despite this, her potassium was well within normal limits at 4.1.  BUN was 19 and creatinine 0.82.   The patient was to return to the emergency room, should she have  recurrence of pain.   No change was made, and her home medications of:  1. Femora 2.5 mg daily.  2. Fosamax 70 mg weekly.  3. Hydrochlorothiazide 50 mg daily.  4. Omeprazole 20 mg, 30 minutes before breakfast.  5. Os-Cal D 600 mg twice a day.  6. Enteric-coated aspirin 81 mg daily.   The med reconciliation sheet also lists diltiazem 240 mg daily and  Requip 1 mg at bedtime.  She will be asked to take her medications to  the appointment with  her primary care physician to verify the exact  medications and doses.  No adjustments were made at the time of  discharge.  The patient declined any pain medications at this time.      Titus Dubin. Alwyn Ren, MD,FACP,FCCP  Electronically Signed     WFH/MEDQ  D:  09/02/2007  T:  09/03/2007  Job:  [id number]   cc:   [name], MD  [address] [place], [place] [address]

## 2011-01-26 NOTE — H&P (Signed)
[name], CORTOPASSI                   ACCOUNT NO.:  [id number]   MEDICAL RECORD NO.:  [id number]          PATIENT TYPE:  INP   LOCATION:  1509                         [facility]ACILITY:  [facility]   PHYSICIAN:  [name], M.D.DATE OF BIRTH:  [birth year]   DATE OF ADMISSION:  08/31/2007  DATE OF DISCHARGE:                              HISTORY & PHYSICAL   PRIMARY CARE PHYSICIAN:  Madison A. Tower, MD   CHIEF COMPLAINT:  Abdominal pain and nausea.   HISTORY OF PRESENT ILLNESS:  The patient is a 77 year old, white female  with past medical history of diabetes mellitus, hypertension and sleep  apnea who has been previously well and today she started having some  feelings of nausea and abdominal distention.  All of a sudden she felt  pain radiating across her abdomen.  She became concerned and came to the  emergency room for further evaluation.  In the emergency room, she was  noted on admission to have a blood pressure of 171 which increased to  187/71.  Labs were drawn and she was found to have a white count of  13.3, AST of 238, ALT 244 and normal lipase.  She underwent an  ultrasound of her abdomen which showed gallstones, but no obstruction of  the gallbladder itself, but possible dilatation of the common bile duct.  It was suspected likely that she had a stone that was briefly lodged in  the common bile duct causing obstruction.  She was given medication for  pain and nausea and said she started to feel better.  Currently, she is  doing okay.  She denies any headaches, vision changes, dysphagia, chest  pain, palpitations, shortness of breath, wheeze or cough.  Currently,  she is having no abdominal pain, no hematuria, dysuria, constipation,  diarrhea, focal extremity numbness, weakness or pain.  Review of systems  otherwise negative.   PAST MEDICAL HISTORY:  1. Hypertension.  2. Diabetes mellitus, diet-controlled.  3. Obstructive sleep apnea.  4. History of breast CA and GERD.   MEDICATIONS:  1. Femara 2.5 p.o. daily.  2. Fosamax 70 each week.  3. Hydrochlorothiazide 50 daily.  4. Omeprazole 20 daily.  5. Os-Cal D 600 daily.  6. Aspirin 81 daily.  7. Lisinopril daily.  8. Multivitamin daily.  9. Tylenol arthritis daily.  10.Medical marijuana.   ALLERGIES:  No known drug allergies.   SOCIAL HISTORY:  No current tobacco, alcohol or drug use.   FAMILY HISTORY:  Noncontributory.   PHYSICAL EXAMINATION:  VITAL SIGNS:  Temperature 98.7, heart rate 95,  blood pressure 171/72, respirations 18, O2 saturations 100% on room air.  GENERAL:  She is alert and oriented x3 in no apparent distress.  HEENT:  Normocephalic, atraumatic.  Mucous membranes are dry.  NECK:  No carotid bruits.  HEART:  Regular rate and rhythm, S1, S2.  LUNGS:  Clear to auscultation bilaterally.  ABDOMEN:  Soft, nondistended, nontender.  Hypoactive bowel sounds.  EXTREMITIES:  No clubbing, cyanosis or edema.   LABORATORY DATA AND X-RAY FINDINGS:  White count 13.3, H&H 13.5 and 40,  MCV  92, platelet count 298, 87% neutrophils.  Sodium 137, potassium 4.1,  chloride 98, bicarb 30, BUN 19, creatinine 0.8, glucose 125.  LFTs are  noted for an AST of 238, ALT 144, lipase level normal.   ASSESSMENT/PLAN:  1. Common bile duct stone causing brief obstruction.  Will make the      patient n.p.o.  Intravenous Protonix, pain and nausea control.      Recheck labs to ensure that her transaminases are trending      downward.  If they are, then advance diet and set up outpatient      surgical followup.  If they start to increase, then we will get a      gastroenterology consult for possible endoscopic retrograde      cholangiopancreatography.  In the meantime, put her on intravenous      proton pump inhibitor and antibiotics.  2. Hypertension, stable.  Lopressor p.r.n.  3. Diabetes mellitus.  Sliding scale and n.p.o.      [name], M.D.  Electronically Signed     SKK/MEDQ  D:   09/01/2007  T:  09/01/2007  Job:  [id number]   cc:   Madison A. Tower, MD  [address] [place], [place] [address]

## 2011-01-26 NOTE — Op Note (Signed)
NAMESADIYA, DURAND                   ACCOUNT NO.:  [id number]   MEDICAL RECORD NO.:  [id number]          PATIENT TYPE:  OIB   LOCATION:  1345                         FACILITY:  [facility]   PHYSICIAN:  Angelia Mould. Derrell Lolling, M.D.DATE OF BIRTH:  [birth year]   DATE OF PROCEDURE:  10/19/2007  DATE OF DISCHARGE:                               OPERATIVE REPORT   PREOPERATIVE DIAGNOSIS:  Chronic cholecystitis with cholelithiasis.   POSTOPERATIVE DIAGNOSIS:  Chronic cholecystitis with cholelithiasis.   OPERATION PERFORMED:  Laparoscopic cholecystectomy with intraoperative  cholangiogram.   SURGEON:  Dr. Claud Kelp.   FIRST ASSISTANT:  Dr. Leonie Man.   OPERATIVE INDICATIONS:  This is a 77 year old white female with a  history of breast cancer and sleep apnea and hypertension.  She has had  some episodes of abdominal pain over the last year that are consistent  with biliary colic.  Gallbladder ultrasound reveals numerous gallstones  and borderline ductal dilatation.  Her liver function tests were  transiently elevated this past December and have normalized now.  She  has been asymptomatic for more than a month.  She had been counseled as  an outpatient and has elected to undergo elective cholecystectomy.   OPERATIVE FINDINGS:  The gallbladder had numerous yellow gallstones  which were visible through the wall of the gallbladder.  The cystic duct  was very tiny but was patent.  The cholangiogram was normal showing  normal intrahepatic and extrahepatic bile ducts, no filling defect, and  no obstruction with good flow of contrast into the duodenum.  Although  we saw the right and left hepatic ducts proximally, we did not fill the  distal biliary ductal structures because the contrast went  preferentially into the duodenum fairly quickly.  The liver looked  normal otherwise.  There some adhesions of the gastric antrum to the  liver which were taken down.  She had some adhesions of the small  bowel  to the intra-abdominal wall at the lower midline presumably from her  previous pelvic surgery.  There were no other pathologic findings.   OPERATIVE TECHNIQUE:  Following induction of general endotracheal  anesthesia the patient's abdomen was prepped and draped in sterile  fashion.  Intravenous antibiotics were given.  The patient was  identified as to correct patient and correct procedure.  0.5% Marcaine  with epinephrine was used as local infiltration anesthetic.  A  vertically oriented incision was made at the lower rim of the umbilicus.  The fascia was incised in the midline and the abdominal cavity entered  under direct vision.  A 10 mm Hassan trocar was inserted and secured  with a pursestring suture of 0-0 Vicryl.  Pneumoperitoneum was created.  Video cam was inserted with visualization and findings as described  above.  A 10-mm trocar was placed in the subxiphoid region and two 5 mm  trocars placed in the right upper quadrant.  The gallbladder fundus was  elevated.  We did have to take down some adhesions of the gastric antrum  to the undersurface the liver but that was fairly straight forward.  The  infundibulum was retracted laterally.  We dissected the peritoneum off  of the cystic duct and cystic artery.  We created a large window behind  the cystic duct.  A cholangiogram catheter was inserted into the cystic  duct.  A cholangiogram was obtained using the C-arm.  The cholangiogram  was normal as described above.  The cholangiogram catheter was removed,  the cystic duct secured with multiple metal clips and divided.  Cystic  artery was isolated, secured with multiple metal clips and divided.  The  gallbladder was dissected from its bed with electrocautery, placed in  the specimen bag and removed.  We had a couple of raw areas on the  gallbladder bed at the liver.  These were controlled nicely with  electrocautery.  We irrigated the operative field.  At completion of  the  case there was no bleeding and no bile leak.  Irrigation fluid was  completely clear.  Trocars were removed under direct vision.  There was  no bleeding from trocar sites.  The pneumoperitoneum was released.  The  fascia at the umbilicus closed with 0-0 Vicryl sutures.  Skin incisions  were closed with subcuticular sutures of 4-0 Monocryl and Dermabond.  Clean bandages were placed and the patient taken to recovery room in  stable condition.  Estimated blood loss was about 10 mL.  Complications  none. Sponge, needle and instrument counts were correct.      Angelia Mould. Derrell Lolling, M.D.  Electronically Signed     HMI/MEDQ  D:  10/19/2007  T:  10/20/2007  Job:  [id number]   cc:   [name], MD  [address] [place], [place] [address]   Genene Churn. Cyndie Chime, M.D.  Fax: [phone]   Barbaraann Share, MD,FCCP  [address]  [place]  [place] [address]

## 2011-01-29 NOTE — Assessment & Plan Note (Signed)
[facility]                             PULMONARY OFFICE NOTE   NAME:[name], [name]                          MRN:          [id number]  DATE:01/18/2007                            DOB:          [birth year]    SUBJECTIVE:  Ms. Martin comes in today for followup of her multiple sleep  issues.  At the last visit, she was found by nocturnal polysomnography  to have a respiratory disturbance index of 15 events per hour, but she  was also noted to have 284 leg jerks with 8 per hour resulting in  arousal or awakening.  She did have a history consistent with restless  leg syndrome.  At that point in time, I felt that her legs were much  more of an issue than her sleep apnea.  That degree of sleep apnea  provided very little risk from a cardiovascular standpoint for her.  It  was decided to go ahead and treat her leg jerks, and then see how things  went.  She was started on Requip at 1 mg nightly, and follows up today.  The patient states that she is sleeping much better, and that her legs  have greatly improved since being on the medication.  She is more rested  in the mornings whenever she arises, and her alertness level during the  day is better as well.   PHYSICAL EXAMINATION:  BP is 146/68.  Pulse 85.  Temperature is 98.1.  Weight is 162 pounds.  Her O2 saturation on room air is 93%.  GENERAL:  She is an overweight, white female, in no acute distress.   IMPRESSION:  1. Severe periodic leg movement secondary to restless leg syndrome.      The patient is much improved on Requip, and has seen a difference      in her sleep efficiency, and also daytime alertness.  She is to      continue on this.  At her next blood draw with her primary care      physician, I would probably check a serum ferritin, since iron      deficiency can result in this problem.  2. Mild obstructive sleep apnea.  At this point in time, the patient      feels that she is satisfied with her  quality of life and her sleep.      She is going to work on weight loss so that her sleep apnea will      decrease further.  Again, this is minimal cardiovascular risk for      her, given the mild severity of her sleep apnea.  Certainly, if she      changes her mind, and feels that her sleep is not satisfactory for      her, she will call me prior to the next visit, and we can arrange a      trial of CPAP.   PLAN:  1. Work on weight loss.  2. Continue on Requip as prescribed.  3. At her next blood draw with her primary care physician,  I would      consider a serum ferritin to rule out iron deficiency as the cause      of her restless leg syndrome.  4. The patient will follow up in 6 months, or sooner if there are      problems.    Barbaraann Share, MD,FCCP  Electronically Signed   KMC/MedQ  DD: 01/18/2007  DT: 01/18/2007  Job #: [id number]   cc:   Marne A. Milinda Antis, MD

## 2011-01-29 NOTE — Op Note (Signed)
[name], [name]                   ACCOUNT NO.:  [id number]   MEDICAL RECORD NO.:  [id number]          PATIENT TYPE:  OIB   LOCATION:  5006                         FACILITY:  [facility]   PHYSICIAN:  Mark C. Ophelia Charter, M.D.    DATE OF BIRTH:  March 04, 1927   DATE OF PROCEDURE:  05/09/2006  DATE OF DISCHARGE:                                 OPERATIVE REPORT   PREOPERATIVE DIAGNOSES:  Left L4-5 herniated nucleus pulposus with free  fragment versus synovial cyst.   POSTOPERATIVE DIAGNOSES:  L4-5 disk protrusion with left L4-5 synovial cyst.   PROCEDURE:  1. Left L4-5 microdiskectomy.  2. Exploration and excision of lumbar canal synovial cyst.   SURGEON:  Mark C. Ophelia Charter, M.D.   ASSISTANT:  Wende Neighbors, P.A.   ANESTHESIA:  General orotracheal plus 6 cc Marcaine skin local.   ESTIMATED BLOOD LOSS:  100 cc.   PROCEDURE IN DETAIL:  After induction of general anesthesia and orotracheal  intubation, the patient was given preoperative Ancef and placed in kneeling  position on the [place] frame with careful padding and positioning.  Her  back was prepped with DuraPrep.  The area was covered with towels.  Betadine  and Vi-drape were applied.  Laminectomy sheet.  Cross-table lateral plain x-  ray with the needle just above the 4-5 disk space with the needle even with  the middle of the top of the pedicle.  Incision was made starting with the  needle and extending distally.  Finger palpation of the 5-1 disk space as  well as 4-5 was performed.  Laminotomy was performed on the left, and this  was extended proximally since  the cyst was just above the level of the disk  space.  The disk space was explored, but the disk was protruding.  __________  was used to carefully protect the dura.  The facet overhanging  was enlarged with some bone shelf removed until the bone was removed up to  the level of the pedicle.  Just above the disk space a brownish mass was  noted.  Disk was opened with an X, and  passes were made with micro-pituitary  removing a small to moderate amount of disk material.  There was no midline  protrusion.  A 180-degree  __________ could be made with the only areas of  compression proximally.  The cyst was teased with a No. 4 Penfield blunt tip  ball nerve hook and the hockey stick.  The cyst was opened.  There were some  clots present in it as well as some gelatinous material consistent with the  synovial cyst.  The wall of the cyst was removed, and laminotomy was  extended more proximally almost completing a hemilaminectomy for full  exposure.  The inferior aspect of the pedicle above was palpated, and no  area of disk compression was left.  Sweeps anterior to the dura 180 degrees  with hockey stick were free.  Final passes to the disk were made.  The wound  was irrigated with saline solution.  There was 1 small area of bleeding  which was coagulated with bipolar cautery. The operative field was dry.  The  fascia was closed with #0 Vicryl, 2-0 Vicryl for the subcutaneous tissue, 4-  0 Vicryl subcuticular closure,  tincture of Benzoin and Steri-Strips.  Marcaine infiltration.  Postop  dressing of paper tape due to the patient's tape allergy.  The instrument  count and needle count were correct.  The patient was transferred to  Recovery in stable condition.      Mark C. Ophelia Charter, M.D.  Electronically Signed     MCY/MEDQ  D:  05/09/2006  T:  05/10/2006  Job:  [id number]

## 2011-01-29 NOTE — Procedures (Signed)
[name], [name]                   ACCOUNT NO.:  [id number]   MEDICAL RECORD NO.:  [id number]          PATIENT TYPE:  OUT   LOCATION:  SLEEP CENTER                 FACILITY:  [facility]   PHYSICIAN:  Barbaraann Share, MD,FCCPDATE OF BIRTH:  [birth year] 11[birth year] 1928   DATE OF STUDY:  11/22/2006                            NOCTURNAL POLYSOMNOGRAM   REFERRING PHYSICIAN:  Barbaraann Share   INDICATION FOR STUDY:  Hypersomnia with sleep apnea.   EPWORTH SLEEPINESS SCORE:  13.   SLEEP ARCHITECTURE:  The patient had a total sleep time of 349 minutes,  with adequate slow wave sleep for age, but decreased REM.  Sleep onset  latency was normal, and REM onset was normal as well.  Sleep efficiency  was decreased at 88%.   RESPIRATORY DATA:  The patient was found to have 63 hypopneas and 26  obstructive apneas for an apnea hypopnea index of 15 events per hour.  The events occurred in all body positions, but were clearly worse during  REM.  Moderate snoring was noted throughout.   OXYGEN DATA:  There was O2 desaturation as low as 765 during her REM  events.   CARDIAC DATA:  Rare PAC and PVC, without clinically significant cardiac  arrhythmias.   MOVEMENT-PARASOMNIA:  The patient was found to have 284 leg jerks, with  8 per hour resulting in arousal or awakening.   IMPRESSIONS-RECOMMENDATIONS:  1. Mild to moderate obstructive sleep apnea/hypopnea syndrome, with an      apnea hypopnea index of 15 events per hour and oxygen desaturation      as low as 76%.  Treatment for this degree of sleep apnea can      include weight loss alone, if applicable, upper airway surgery,      oral appliance, and also continuous positive airway pressure.  2. Very large numbers of leg jerks with significant sleep disruption.      Given the severity of the leg movements, I      suspect the patient may have a primary movement disorder of sleep.      Clinical correlation is suggested, however.      Barbaraann Share, MD,FCCP  Diplomate, American Board of Sleep  Medicine  Electronically Signed     KMC/MEDQ  D:  12/14/2006 09:00:26  T:  12/14/2006 09:23:00  Job:  [id number]

## 2011-01-29 NOTE — Assessment & Plan Note (Signed)
[facility]                             PULMONARY OFFICE NOTE   NAME:[name], [name]                          MRN:          [id number]  DATE:10/12/2006                            DOB:          03-Oct-1926    HISTORY OF PRESENT ILLNESS:  The patient is a 77 year old female who I  have been asked to see for possible sleep apnea. The patient states that  she snores, but no one has ever mentioned pauses in her breathing during  sleep. She denies any choking arousals. The patient typically goes to  bed between 10 and 10:30 and gets up at 6 a.m. to start her day. Most of  the time, she is fairly rested. The patient states the last two years,  however, that she will fall asleep easily during the day with reading or  watching TV and has become very frustrated with this. Also of note, she  has had some near misses with recent driving. Of note, her weight has  been stable over the last few years.   PAST MEDICAL HISTORY:  1. Significant for hypertension.  2. History of diabetes treated with diet changes.  3. History of breast and uterine cancer but no radiation or      chemotherapy required.  4. History of carpal tunnel syndrome.   CURRENT MEDICATIONS:  1. Diltiazem XR 240 daily.  2. Omeprazole 20 mg daily.  3. Hydrochlorothiazide 50 mg daily.  4. Femara 2.5 daily.  5. Enteric-coated aspirin 81 mg daily.  6. Stool softener daily.  7. Fosamax 70 mg weekly.  8. Other over-the-counter multivitamins.   THE PATIENT IS EITHER ALLERGIC OR INTOLERANT TO CODEINE, SULFA AND  TETRACYCLINE.   SOCIAL HISTORY:  She is single. She has never smoked.   FAMILY HISTORY:  Remarkable for her sister having breast cancer and  brother having colon cancer.   REVIEW OF SYSTEMS:  As per history of present illness. Also see patient  intake form documented in the chart.   PHYSICAL EXAMINATION:  In general, she is an overweight female in no  acute distress. Blood pressure is  128/60, pulse is 97, temperature 99,  weight 162 pounds, O2 saturation on room air is 93%.  HEENT:  Pupils are equal, round, and reactive to light and  accommodation. Extraocular muscles are intact. Nares show mild septal  deviation to the left. Oropharynx shows mild elongation of the soft  palate and uvula.  NECK:  Was supple without JVD or lymphadenopathy. There is no palpable  lymphadenopathy. There is no palpable thyromegaly.  CHEST:  Is totally clear.  CARDIAC:  Regular rate and rhythm. No murmurs, rubs, or gallops.  ABDOMEN:  Soft, nontender with good bowel sounds.  GENITAL EXAM, RECTAL EXAM, BREAST EXAM:  Were not done and not  indicated.  LOWER EXTREMITIES:  Show trace edema, right greater than left, with some  varicosities. Pulses were intact distally.  NEUROLOGICAL:  Alert and oriented with no obvious motor deficits.   IMPRESSION:  Probable obstructive sleep apnea. The patient gives a very  good history for this  and has significant daytime sleepiness. She has  even had issues with driving, and I have reminded her of her moral  responsibility not to drive while being sleepy. At this point in time, I  think she needs a nocturnal polysomnogram to rule out sleep apnea and  other sleep disturbances.   PLAN:  1. Schedule her for NPSG.  2. Work on weight loss.  3. The patient will follow up after the above.     Barbaraann Share, MD,FCCP  Electronically Signed    KMC/MedQ  DD: 12/08/2006  DT: 12/08/2006  Job #: [id number]   cc:   Marne A. Milinda Antis, MD

## 2011-02-12 LAB — HM PAP SMEAR: HM Pap smear: NORMAL

## 2011-03-13 ENCOUNTER — Inpatient Hospital Stay (INDEPENDENT_AMBULATORY_CARE_PROVIDER_SITE_OTHER)
Admission: RE | Admit: 2011-03-13 | Discharge: 2011-03-13 | Disposition: A | Discharge status: Home / Self Care | Payer: Medicare Other | Source: Ambulatory Visit | Attending: Family Medicine | Admitting: Family Medicine

## 2011-03-13 DIAGNOSIS — L255 Unspecified contact dermatitis due to plants, except food: Secondary | ICD-10-CM

## 2011-04-14 ENCOUNTER — Encounter: Payer: Self-pay | Admitting: Family Medicine

## 2011-04-15 ENCOUNTER — Encounter: Payer: Self-pay | Admitting: Family Medicine

## 2011-04-15 ENCOUNTER — Ambulatory Visit (INDEPENDENT_AMBULATORY_CARE_PROVIDER_SITE_OTHER)
Admission: RE | Admit: 2011-04-15 | Discharge: 2011-04-15 | Disposition: A | Discharge status: Home / Self Care | Payer: Medicare Other | Source: Ambulatory Visit | Attending: Family Medicine | Admitting: Family Medicine

## 2011-04-15 ENCOUNTER — Ambulatory Visit (INDEPENDENT_AMBULATORY_CARE_PROVIDER_SITE_OTHER): Payer: Medicare Other | Admitting: Family Medicine

## 2011-04-15 VITALS — BP 150/72 | HR 88 | Temp 98.5°F | Wt 166.2 lb

## 2011-04-15 DIAGNOSIS — R05 Cough: Secondary | ICD-10-CM

## 2011-04-15 MED ORDER — OMEPRAZOLE 40 MG PO CPDR: 40.0000 mg | DELAYED_RELEASE_CAPSULE | Freq: Every day | ORAL | Status: DC

## 2011-04-15 MED ORDER — HYDROCOD POLST-CHLORPHEN POLST 10-8 MG/5ML PO LQCR: 5.0000 mL | Freq: Every evening | ORAL | Status: DC | PRN

## 2011-04-15 NOTE — Patient Instructions (Signed)
Chest xray today. I wonder if reflux is getting worse so increase omeprazole to 2 pills daily. If that doesn't help, may refill cough syrup - may make you sleepy so use sparingly. Good to see you today, call us with questions. If not better, return to be seen again.

## 2011-04-15 NOTE — Progress Notes (Signed)
  Subjective:    Patient ID: [name], female    DOB: 06-23-1927, 77 y.o.   MRN: [id number]  HPI CC: cough  Intermittent cough for months, recently worse last few days.  Bringing up white mucous.  Cough drops do help some.  Cough syrup helped but expired.  No cold sxs - RN, ST.  Does feel nose clogged up.  Sometimes feels phlegm in throat without cough.  No fevers/chills, abd pain, n/v/d, HA, sinus pressure.  No h/o smoking.  No sick contacts.  No h/o asthma, COPD, allergic rhinitis.  No leg swelling, chest pain or tightness, recent increase in shortness of breath but does note that gets winded when climbing stairs.  No recent worsening of reflux sxs.  Does have h/o reflux, thinks well controlled on omeprazole.  Cough not associated with foods.  Mainly irritating because does have to clear throat often.  Review of Systems Per HPI    Objective:   Physical Exam  Nursing note and vitals reviewed. Constitutional: She appears well-developed and well-nourished. No distress.  HENT:  Head: Normocephalic and atraumatic.  Right Ear: External ear normal.  Left Ear: External ear normal.  Nose: Nose normal.  Mouth/Throat: Oropharynx is clear and moist. No oropharyngeal exudate.  Eyes: Conjunctivae and EOM are normal. Pupils are equal, round, and reactive to light. No scleral icterus.  Neck: Normal range of motion. Neck supple.  Cardiovascular: Normal rate, regular rhythm, normal heart sounds and intact distal pulses.   No murmur heard. Pulmonary/Chest: Effort normal and breath sounds normal. No respiratory distress. She has no wheezes. She has no rales.       Mild crackles RLL>LLL  Musculoskeletal: She exhibits no edema.  Lymphadenopathy:    She has no cervical adenopathy.  Skin: Skin is warm and dry. No rash noted.  Psychiatric: She has a normal mood and affect.          Assessment & Plan:

## 2011-04-15 NOTE — Assessment & Plan Note (Signed)
No sxs of viral syndrome. Check CXR given duration of sxs and remote h/o breast ca - nothing acute on my read. H/o GERD, ?cough from occult reflux.  increase omeprazole to 40mg  daily. Provided with tussionex for cough as well. If not improving after treament, update [place]. No evidence of infection currently.

## 2011-05-03 ENCOUNTER — Other Ambulatory Visit: Payer: 59

## 2011-05-04 ENCOUNTER — Other Ambulatory Visit (INDEPENDENT_AMBULATORY_CARE_PROVIDER_SITE_OTHER): Payer: Medicare Other | Admitting: Family Medicine

## 2011-05-04 DIAGNOSIS — R739 Hyperglycemia, unspecified: Secondary | ICD-10-CM

## 2011-05-04 DIAGNOSIS — R7309 Other abnormal glucose: Secondary | ICD-10-CM

## 2011-05-04 DIAGNOSIS — I10 Essential (primary) hypertension: Secondary | ICD-10-CM

## 2011-05-04 DIAGNOSIS — E7800 Pure hypercholesterolemia, unspecified: Secondary | ICD-10-CM

## 2011-05-04 LAB — RENAL FUNCTION PANEL
Calcium: 9.5 mg/dL (ref 8.4–10.5)
Creatinine, Ser: 0.9 mg/dL (ref 0.4–1.2)
Glucose, Bld: 97 mg/dL (ref 70–99)
Phosphorus: 3.6 mg/dL (ref 2.3–4.6)
Potassium: 3.9 mEq/L (ref 3.5–5.1)
Sodium: 136 mEq/L (ref 135–145)

## 2011-05-04 LAB — AST: AST: 22 U/L (ref 0–37)

## 2011-05-04 LAB — LIPID PANEL
LDL Cholesterol: 78 mg/dL (ref 0–99)
Total CHOL/HDL Ratio: 2
Triglycerides: 86 mg/dL (ref 0.0–149.0)

## 2011-05-04 LAB — HEMOGLOBIN A1C: Hgb A1c MFr Bld: 6.4 % (ref 4.6–6.5)

## 2011-05-12 ENCOUNTER — Encounter: Payer: Self-pay | Admitting: Family Medicine

## 2011-05-12 ENCOUNTER — Ambulatory Visit (INDEPENDENT_AMBULATORY_CARE_PROVIDER_SITE_OTHER): Payer: Medicare Other | Admitting: Family Medicine

## 2011-05-12 DIAGNOSIS — L259 Unspecified contact dermatitis, unspecified cause: Secondary | ICD-10-CM

## 2011-05-12 DIAGNOSIS — L309 Dermatitis, unspecified: Secondary | ICD-10-CM

## 2011-05-12 DIAGNOSIS — R059 Cough, unspecified: Secondary | ICD-10-CM

## 2011-05-12 DIAGNOSIS — R7309 Other abnormal glucose: Secondary | ICD-10-CM

## 2011-05-12 DIAGNOSIS — K219 Gastro-esophageal reflux disease without esophagitis: Secondary | ICD-10-CM

## 2011-05-12 DIAGNOSIS — R05 Cough: Secondary | ICD-10-CM

## 2011-05-12 DIAGNOSIS — I10 Essential (primary) hypertension: Secondary | ICD-10-CM

## 2011-05-12 DIAGNOSIS — E7800 Pure hypercholesterolemia, unspecified: Secondary | ICD-10-CM

## 2011-05-12 MED ORDER — HYDROCORTISONE VALERATE 0.2 % EX CREA: TOPICAL_CREAM | CUTANEOUS | Status: DC

## 2011-05-12 NOTE — Assessment & Plan Note (Signed)
Cough is some improved with inc dose of omeprazole Will continue to follow Did recommend wt loss

## 2011-05-12 NOTE — Assessment & Plan Note (Signed)
Very good cholesterol this check Rev labs with pt Rev low sat fat diet

## 2011-05-12 NOTE — Assessment & Plan Note (Signed)
Good control on current med without changes  Rev meds Rev labs with pt  Enc wt loss and increased activity

## 2011-05-12 NOTE — Progress Notes (Signed)
Subjective:    Patient ID: [name], female    DOB: [birth year], 77 y.o.   MRN: [id number]  HPI Here for f/u of hyperlipidemia and HTN and also hyperglycemia  Was also here for cough early this month  Wt is up 1 lb  Hyperglycemia is improved with a1c of 6.4 Diet - not watching it as well as she should  Knows she needs to loose weight  Is supposed to start back bowling next week    Lipids are very well controlled Lab Results  Component Value Date   CHOL 162 05/04/2011   CHOL 179 11/06/2010   CHOL 162 05/04/2010   Lab Results  Component Value Date   HDL 66.60 05/04/2011   HDL 57.84 11/06/2010   HDL 69.62 05/04/2010   Lab Results  Component Value Date   LDLCALC 78 05/04/2011   LDLCALC 92 11/06/2010   LDLCALC 83 05/04/2010   Lab Results  Component Value Date   TRIG 86.0 05/04/2011   TRIG 58.0 11/06/2010   TRIG 92.0 05/04/2010   Lab Results  Component Value Date   CHOLHDL 2 05/04/2011   CHOLHDL 2 11/06/2010   CHOLHDL 3 05/04/2010   No results found for this basename: LDLDIRECT   diet -- watches fats fairly well   Cough  Dr Reece Agar inc her omeprazole to 40 mg daily Still has spells of it  Thinks she is some improved - wants to watch it  No heartburn symptoms at all    bp is good  136/70 today No ha or palpitations or cp or edema   Has recurrent dermatitis on face - never knew what caused it  westcort cream works well for that prn and needs a refil   Patient Active Problem List  Diagnoses  . PURE HYPERCHOLESTEROLEMIA  . RESTLESS LEG SYNDROME  . HYPERTENSION  . ALLERGIC RHINITIS  . GERD  . CHOLELITHIASIS  . MICROSCOPIC HEMATURIA  . KERATOSIS, SEBORRHEIC NEC  . OSTEOARTHRITIS  . BACK PAIN  . OSTEOPOROSIS  . URINARY INCONTINENCE, MIXED  . HYPERGLYCEMIA  . BREAST CANCER, HX OF  . Cough  . Dermatitis of face   Past Medical History  Diagnosis Date  . HX: breast cancer 12/99  . HTN (hypertension)   . GERD (gastroesophageal reflux disease)   . Hyperglycemia   .  Allergic rhinitis   . Osteoporosis   . Incontinence   . Chronic cough   . Compression fracture   . Diverticulosis   . Hemorrhoids   . Endometrial cancer   . Personal history of skin cancer     Basal cell   Past Surgical History  Procedure Date  . Total abdominal hysterectomy w/ bilateral salpingoophorectomy     endometrial cancer  . Mastectomy     Left  . Back surgery 2007    Dr. Ophelia Charter -disk  . Skin cancer excision 10/08    basal cell  . Cholecystectomy 2009  . Cataract extraction 7/10  . Dexa 2/00    LS osteoporsis  . Dexa 2/02    same; OP, LS, spine, hip  . Dexa 3/04    BMD spine; low BMD hip  . Colonoscopy 3/02    Diverticulosis; hemorrhoids   History  Substance Use Topics  . Smoking status: Never Smoker   . Smokeless tobacco: Not on file  . Alcohol Use: No   Family History  Problem Relation Age of Onset  . Colon cancer Brother    Allergies  Allergen Reactions  .  Clarithromycin     REACTION: Nausea  . Codeine     REACTION: ? reaction  . EAV:[id number]+[id number]+MVHQIONGEX Acid+Aspartame     REACTION: ? reaction  . Sulfonamide Derivatives     REACTION: ? reaction  . Tetracycline     REACTION: ? reaction   Current Outpatient Prescriptions on File Prior to Visit  Medication Sig Dispense Refill  . acetaminophen (TYLENOL) 650 MG CR tablet Take 1,300 mg by mouth 2 (two) times daily.        Marland Kitchen aspirin 81 MG tablet Take 81 mg by mouth daily.        . calcium carbonate (TUMS - DOSED IN MG ELEMENTAL CALCIUM) 500 MG chewable tablet Chew 3 tablets by mouth daily.        . chlorpheniramine-HYDROcodone (TUSSIONEX) 10-8 MG/5ML LQCR Take 5 mLs by mouth at bedtime as needed. Sedation precautions  140 mL  0  . diltiazem (CARDIZEM CD) 240 MG 24 hr capsule Take 240 mg by mouth daily.        Marland Kitchen docusate sodium (COLACE) 100 MG capsule Take 100 mg by mouth 2 (two) times daily.        . hydrochlorothiazide 50 MG tablet Take 50 mg by mouth daily.        . Multiple Vitamin  (MULTIVITAMIN) tablet Take 1 tablet by mouth daily.        Marland Kitchen omeprazole (PRILOSEC) 40 MG capsule Take 1 capsule (40 mg total) by mouth daily.  30 capsule  3  . rOPINIRole (REQUIP) 1 MG tablet Take 1 mg by mouth daily.        . vitamin D, CHOLECALCIFEROL, 400 UNITS tablet Take 800 Units by mouth daily.        Marland Kitchen alendronate (FOSAMAX) 70 MG tablet Take 70 mg by mouth every 7 (seven) days. Take with a full glass of water on an empty stomach.       . methylcellulose packet Take 1 each by mouth as needed.            Review of Systems Review of Systems  Constitutional: Negative for fever, appetite change, fatigue and unexpected weight change.  Eyes: Negative for pain and visual disturbance.  Respiratory: Negative for cough and shortness of breath.   Cardiovascular: Negative.  for cp or palpitations  Gastrointestinal: Negative for nausea, diarrhea and constipation.  Genitourinary: Negative for urgency and frequency.  Skin: Negative for pallor. pos for dry scaley rash on her face  Neurological: Negative for weakness, light-headedness, numbness and headaches.  Hematological: Negative for adenopathy. Does not bruise/bleed easily.  Psychiatric/Behavioral: Negative for dysphoric mood. The patient is not nervous/anxious.          Objective:   Physical Exam  Constitutional: She appears well-developed and well-nourished. No distress.       overwt and well appearing   HENT:  Head: Normocephalic and atraumatic.  Mouth/Throat: Oropharynx is clear and moist.  Eyes: Conjunctivae and EOM are normal. Pupils are equal, round, and reactive to light.  Neck: Normal range of motion. Neck supple. No JVD present. Carotid bruit is not present. No thyromegaly present.  Cardiovascular: Normal rate, regular rhythm, normal heart sounds and intact distal pulses.   Pulmonary/Chest: Effort normal and breath sounds normal. No respiratory distress. She has no wheezes.  Abdominal: Soft. Bowel sounds are normal. She  exhibits no distension and no mass. There is no tenderness.  Musculoskeletal: Normal range of motion. She exhibits no edema and no tenderness.  Lymphadenopathy:  She has no cervical adenopathy.  Neurological: She is alert. She has normal reflexes. Coordination normal.  Skin: Skin is warm and dry. Rash noted. No erythema. No pallor.       Patchy dry rash resembling eczema on chin and neck in patchy distribution  Psychiatric: She has a normal mood and affect.          Assessment & Plan:

## 2011-05-12 NOTE — Patient Instructions (Signed)
Let me know if cough worsens or does not improve further  Labs are stable Try to avoid sugar and fat in diet and work on weight loss  If you are interested in shingles vaccine in future - call your insurance company to see how coverage is and call us to schedule  Schedule 30 min annual exam in 6 months with labs prior

## 2011-05-12 NOTE — Assessment & Plan Note (Signed)
This is fairly stable with a1c of 6.4 No diabetic symptoms Rev labs Rev low glycemic diet Enc wt loss F/u in 6 mo

## 2011-05-12 NOTE — Assessment & Plan Note (Signed)
Refilled hydrocortisone (westcort ) cream for face prn  ? Eczema or other dermatitis comes and goes Rev products she uses

## 2011-05-12 NOTE — Assessment & Plan Note (Signed)
This has improved with inc omeprazole but not totally gone Will continue to watch Rev cxr report with pt  Re assuring exam Has not really been bad enough to need the tussionex often

## 2011-06-04 LAB — COMPREHENSIVE METABOLIC PANEL
ALT: 19
Alkaline Phosphatase: 61
BUN: 13
CO2: 29
GFR calc non Af Amer: >60
Glucose, Bld: 98
Potassium: 3.6
Sodium: 139

## 2011-06-04 LAB — URINALYSIS, ROUTINE W REFLEX MICROSCOPIC
Glucose, UA: NEGATIVE
Ketones, ur: NEGATIVE
Leukocytes, UA: NEGATIVE
Protein, ur: NEGATIVE
pH: 5.5

## 2011-06-04 LAB — DIFFERENTIAL
Basophils Absolute: 0.1
Basophils Relative: 1
Neutro Abs: 4.9
Neutrophils Relative %: 63

## 2011-06-04 LAB — CBC
HCT: 39.9
Hemoglobin: 13.8
MCHC: 34.6
RBC: 4.38

## 2011-06-18 LAB — URINE MICROSCOPIC-ADD ON

## 2011-06-18 LAB — COMPREHENSIVE METABOLIC PANEL
Albumin: 4.1
Alkaline Phosphatase: 73
BUN: 11
BUN: 19
CO2: 28
Calcium: 8.6
Calcium: 9.4
Chloride: 106
Creatinine, Ser: 0.78
GFR calc Af Amer: >60
GFR calc non Af Amer: >60
Glucose, Bld: 101 — ABNORMAL HIGH
Potassium: 4.1
Sodium: 137
Total Bilirubin: 0.9
Total Protein: 7.7

## 2011-06-18 LAB — HEPATIC FUNCTION PANEL
ALT: 121 — ABNORMAL HIGH
AST: 108 — ABNORMAL HIGH
Albumin: 3.5
Albumin: 3.5
Alkaline Phosphatase: 67
Indirect Bilirubin: 0.5
Total Bilirubin: 0.8
Total Protein: 6.4
Total Protein: 6.5

## 2011-06-18 LAB — CBC
HCT: 37.4
HCT: 39.6
Hemoglobin: 12.9
MCHC: 34.1
MCHC: 34.5
MCV: 91.7
Platelets: 298
RBC: 4.08
RDW: 13.7
WBC: 6.9

## 2011-06-18 LAB — URINALYSIS, ROUTINE W REFLEX MICROSCOPIC
Nitrite: NEGATIVE
Specific Gravity, Urine: 1.019
Urobilinogen, UA: 0.2

## 2011-06-18 LAB — AMYLASE: Amylase: 32

## 2011-06-18 LAB — DIFFERENTIAL
Basophils Relative: 0
Lymphs Abs: 0.8
Monocytes Absolute: 0.9
Monocytes Relative: 7
Neutro Abs: 11.6 — ABNORMAL HIGH

## 2011-06-18 LAB — LIPASE, BLOOD: Lipase: 23

## 2011-07-01 ENCOUNTER — Ambulatory Visit (INDEPENDENT_AMBULATORY_CARE_PROVIDER_SITE_OTHER): Payer: Medicare Other

## 2011-07-01 DIAGNOSIS — Z23 Encounter for immunization: Secondary | ICD-10-CM

## 2011-08-03 ENCOUNTER — Other Ambulatory Visit: Payer: Self-pay | Admitting: *Deleted

## 2011-08-03 MED ORDER — OMEPRAZOLE 40 MG PO CPDR: 40.0000 mg | DELAYED_RELEASE_CAPSULE | Freq: Every day | ORAL | Status: DC

## 2011-08-09 ENCOUNTER — Other Ambulatory Visit: Payer: Self-pay

## 2011-08-09 NOTE — Telephone Encounter (Signed)
[facility] faxed refill request Ropinirole HCL 1 mg. Last filled 08/03/11 and pt last seen 05/12/11.Please advise.

## 2011-08-09 NOTE — Telephone Encounter (Signed)
If last filled 08/03/11 why does she need it ?

## 2011-08-10 NOTE — Telephone Encounter (Signed)
Spoke with Buyer, retail at [facility]. Verified they had filled Rx (which they had). She said the request could've just been due to an automatic refill request based on the fact the patient didn't have anymore refills left on the current script. I told her we would wait until the patient needs a new script and then fill it.

## 2011-09-03 ENCOUNTER — Telehealth: Payer: Self-pay | Admitting: *Deleted

## 2011-09-03 MED ORDER — DILTIAZEM HCL ER COATED BEADS 240 MG PO CP24: 240.0000 mg | ORAL_CAPSULE | Freq: Every day | ORAL | Status: DC

## 2011-10-31 ENCOUNTER — Telehealth: Payer: Self-pay | Admitting: Family Medicine

## 2011-10-31 DIAGNOSIS — I10 Essential (primary) hypertension: Secondary | ICD-10-CM

## 2011-10-31 DIAGNOSIS — E7800 Pure hypercholesterolemia, unspecified: Secondary | ICD-10-CM

## 2011-10-31 DIAGNOSIS — M810 Age-related osteoporosis without current pathological fracture: Secondary | ICD-10-CM

## 2011-10-31 DIAGNOSIS — K219 Gastro-esophageal reflux disease without esophagitis: Secondary | ICD-10-CM

## 2011-10-31 DIAGNOSIS — R7309 Other abnormal glucose: Secondary | ICD-10-CM

## 2011-10-31 NOTE — Telephone Encounter (Signed)
Message copied by Judy Pimple on Sun Oct 31, 2011  3:02 PM ------      Message from: Alvina Chou      Created: Wed Oct 27, 2011 12:02 PM      Regarding: lab orders for 2.18.13       Patient is scheduled for CPX labs, please order future labs, Thanks , Camelia Eng

## 2011-11-01 ENCOUNTER — Other Ambulatory Visit (INDEPENDENT_AMBULATORY_CARE_PROVIDER_SITE_OTHER): Payer: Medicare Other

## 2011-11-01 DIAGNOSIS — R7309 Other abnormal glucose: Secondary | ICD-10-CM

## 2011-11-01 DIAGNOSIS — M810 Age-related osteoporosis without current pathological fracture: Secondary | ICD-10-CM

## 2011-11-01 DIAGNOSIS — E7800 Pure hypercholesterolemia, unspecified: Secondary | ICD-10-CM

## 2011-11-01 DIAGNOSIS — K219 Gastro-esophageal reflux disease without esophagitis: Secondary | ICD-10-CM

## 2011-11-01 DIAGNOSIS — I10 Essential (primary) hypertension: Secondary | ICD-10-CM

## 2011-11-01 LAB — COMPREHENSIVE METABOLIC PANEL
Alkaline Phosphatase: 56 U/L (ref 39–117)
CO2: 32 mEq/L (ref 19–32)
Creatinine, Ser: 0.9 mg/dL (ref 0.4–1.2)
GFR: 62.81 mL/min (ref >60.00)
Glucose, Bld: 101 mg/dL — ABNORMAL HIGH (ref 70–99)
Sodium: 137 mEq/L (ref 135–145)
Total Bilirubin: 0.4 mg/dL (ref 0.3–1.2)

## 2011-11-01 LAB — CBC WITH DIFFERENTIAL/PLATELET
Eosinophils Relative: 1.8 % (ref 0.0–5.0)
HCT: 43.6 % (ref 36.0–46.0)
Hemoglobin: 14.7 g/dL (ref 12.0–15.0)
Lymphs Abs: 1 10*3/uL (ref 0.7–4.0)
MCV: 93.8 fl (ref 78.0–100.0)
Monocytes Absolute: 0.8 10*3/uL (ref 0.1–1.0)
Monocytes Relative: 12.4 % — ABNORMAL HIGH (ref 3.0–12.0)
Neutro Abs: 4.6 10*3/uL (ref 1.4–7.7)
Platelets: 285 10*3/uL (ref 150.0–400.0)
RDW: 13.6 % (ref 11.5–14.6)

## 2011-11-01 LAB — LIPID PANEL
Cholesterol: 178 mg/dL (ref 0–200)
HDL: 68.4 mg/dL (ref >39.00)
Triglycerides: 80 mg/dL (ref 0.0–149.0)
VLDL: 16 mg/dL (ref 0.0–40.0)

## 2011-11-01 LAB — HEMOGLOBIN A1C: Hgb A1c MFr Bld: 6.5 % (ref 4.6–6.5)

## 2011-11-08 ENCOUNTER — Other Ambulatory Visit: Payer: Self-pay | Admitting: *Deleted

## 2011-11-08 MED ORDER — ROPINIROLE HCL 1 MG PO TABS: 1.0000 mg | ORAL_TABLET | Freq: Every day | ORAL | Status: DC

## 2011-11-08 NOTE — Telephone Encounter (Signed)
Will refill electronically I see her this wk for physical

## 2011-11-09 ENCOUNTER — Encounter: Payer: Self-pay | Admitting: Family Medicine

## 2011-11-10 ENCOUNTER — Ambulatory Visit (INDEPENDENT_AMBULATORY_CARE_PROVIDER_SITE_OTHER): Payer: Medicare Other | Admitting: Family Medicine

## 2011-11-10 ENCOUNTER — Encounter: Payer: Self-pay | Admitting: Family Medicine

## 2011-11-10 VITALS — BP 140/72 | HR 96 | Temp 98.0°F | Ht 60.0 in | Wt 166.2 lb

## 2011-11-10 DIAGNOSIS — R7309 Other abnormal glucose: Secondary | ICD-10-CM

## 2011-11-10 DIAGNOSIS — M810 Age-related osteoporosis without current pathological fracture: Secondary | ICD-10-CM

## 2011-11-10 DIAGNOSIS — Z853 Personal history of malignant neoplasm of breast: Secondary | ICD-10-CM

## 2011-11-10 DIAGNOSIS — I10 Essential (primary) hypertension: Secondary | ICD-10-CM

## 2011-11-10 DIAGNOSIS — Z1211 Encounter for screening for malignant neoplasm of colon: Secondary | ICD-10-CM

## 2011-11-10 DIAGNOSIS — E7800 Pure hypercholesterolemia, unspecified: Secondary | ICD-10-CM

## 2011-11-10 DIAGNOSIS — E669 Obesity, unspecified: Secondary | ICD-10-CM

## 2011-11-10 MED ORDER — DILTIAZEM HCL ER COATED BEADS 240 MG PO CP24: 240.0000 mg | ORAL_CAPSULE | Freq: Every day | ORAL | Status: DC

## 2011-11-10 MED ORDER — HYDROCHLOROTHIAZIDE 50 MG PO TABS: 50.0000 mg | ORAL_TABLET | Freq: Every day | ORAL | Status: DC

## 2011-11-10 MED ORDER — ROPINIROLE HCL 1 MG PO TABS: 1.0000 mg | ORAL_TABLET | Freq: Every day | ORAL | Status: DC

## 2011-11-10 MED ORDER — OMEPRAZOLE 40 MG PO CPDR: 40.0000 mg | DELAYED_RELEASE_CAPSULE | Freq: Every day | ORAL | Status: DC

## 2011-11-10 NOTE — Assessment & Plan Note (Signed)
Pt is doing well with no recurrence mammo nl this summer Gyn does her annual breast exam in summer

## 2011-11-10 NOTE — Assessment & Plan Note (Signed)
Discussed how this problem influences overall health and the risks it imposes  Reviewed plan for weight loss with lower calorie diet (via better food choices and also portion control or program like weight watchers) and exercise building up to or more than 30 minutes 5 days per week including some aerobic activity   this would help DM also

## 2011-11-10 NOTE — Assessment & Plan Note (Signed)
a1c is 6.5- at borderline of DM Disc better diet-needs to quit sweets and turn to more complex carbs in smaller portions (also loose wt) Has done DM teaching in past  Adv regular exercise also Nl foot exam  opthy exam up to date

## 2011-11-10 NOTE — Assessment & Plan Note (Signed)
Orthopedic (Dr Ophelia Charter is following this) He took her off of fosamax 6 mo ago and she has dexa planned Asked for a copy of result when it comes in  Rev ca and D intake and need for exercise

## 2011-11-10 NOTE — Patient Instructions (Signed)
If you are interested in a shingles/zoster vaccine - call your insurance to check on coverage,( you should not get it within 1 month of other vaccines) , then call us for a prescription  for it to take to a pharmacy that gives the shot   please do the stool card for colon cancer screening  For cholesterol,  Avoid red meat/ fried foods/ egg yolks/ fatty breakfast meats/ butter, cheese and high fat dairy/ and shellfish   For diabetes - avoid sweets , and choose fruit instead (in small servings) , also all bread/ beans / pasta/ rice small servings - and stick to brown if possible  Try to get some exercise 5 days per week - indoors or out  Also weight loss would help the diabetes also

## 2011-11-10 NOTE — Progress Notes (Signed)
Subjective:    Patient ID: [name], female    DOB: Dec 08, 1926, 78 y.o.   MRN: [id number]  HPI Here for check up of chronic medical conditions and to review health mt list  Is doing fine- nothing new going on     Wt is stable with bmi of 32 Obese Diet is not as good as it should be - likes her desserts too much  Has a bad sweet tooth - not a lot of control over that  Not a lot of exercise - bowling frequently , however Does not walk in the winter    Hyperlipidemia  Controlled by diet Lab Results  Component Value Date   CHOL 178 11/01/2011   CHOL 162 05/04/2011   CHOL 179 11/06/2010   Lab Results  Component Value Date   HDL 68.40 11/01/2011   HDL 78.29 05/04/2011   HDL 56.21 11/06/2010   Lab Results  Component Value Date   LDLCALC 94 11/01/2011   LDLCALC 78 05/04/2011   LDLCALC 92 11/06/2010   Lab Results  Component Value Date   TRIG 80.0 11/01/2011   TRIG 86.0 05/04/2011   TRIG 58.0 11/06/2010   Lab Results  Component Value Date   CHOLHDL 3 11/01/2011   CHOLHDL 2 05/04/2011   CHOLHDL 2 11/06/2010   No results found for this basename: LDLDIRECT   diet-- does not really watch that carefully  Does not have a list of what not to eat Does drink fat free milk Loves vegetables- not getting enough   bp is 140/72    Today No cp or palpitations or headaches or edema  No side effects to medicines    OP  Last dexa- unsure when  Dr Ophelia Charter took her off fosamax this year -- and will check dexa 6 mo after stopping it  On ca and D- is taking those No fractures   Hyperglycemia Checks at home- am 98-102 - stays very good , occ check in afternoon but not lately  Fairly controlled with lifestyle a1c is 6.5 occas eats sugar free cookies  Only diet drinks and water  opthy nl 04/27/11 No numbness in feet or fingers  No thirst Is diet control     Hx of breast cancer - doing well , nothing new  Had mammo 7/12-- was ok  Is already scheduled for the next one   Nl pap  02/23/11- was normal  F/u July Dr Hyacinth Meeker still  Colon cancer screen ? Last colonoscopy - not excited to do another one  Will do ifob     Patient Active Problem List  Diagnoses  . PURE HYPERCHOLESTEROLEMIA  . RESTLESS LEG SYNDROME  . HYPERTENSION  . ALLERGIC RHINITIS  . GERD  . CHOLELITHIASIS  . MICROSCOPIC HEMATURIA  . KERATOSIS, SEBORRHEIC NEC  . OSTEOARTHRITIS  . BACK PAIN  . OSTEOPOROSIS  . URINARY INCONTINENCE, MIXED  . Diabetes mellitus type 2, diet-controlled  . BREAST CANCER, HX OF  . Cough  . Dermatitis of face  . Obesity   Past Medical History  Diagnosis Date  . HX: breast cancer 12/99  . HTN (hypertension)   . GERD (gastroesophageal reflux disease)   . Hyperglycemia   . Allergic rhinitis   . Incontinence   . Chronic cough   . Compression fracture   . Diverticulosis   . Hemorrhoids   . Endometrial cancer   . Personal history of skin cancer     Basal cell  . Osteoporosis  fosamax in past/ followed by Dr Ophelia Charter    Past Surgical History  Procedure Date  . Total abdominal hysterectomy w/ bilateral salpingoophorectomy     endometrial cancer  . Mastectomy     Left  . Back surgery 2007    Dr. Ophelia Charter -disk  . Skin cancer excision 10/08    basal cell  . Cholecystectomy 2009  . Cataract extraction 7/10  . Dexa 2/00    LS osteoporsis  . Dexa 2/02    same; OP, LS, spine, hip  . Dexa 3/04    BMD spine; low BMD hip  . Colonoscopy 3/02    Diverticulosis; hemorrhoids   History  Substance Use Topics  . Smoking status: Never Smoker   . Smokeless tobacco: Not on file  . Alcohol Use: No   Family History  Problem Relation Age of Onset  . Colon cancer Brother    Allergies  Allergen Reactions  . Clarithromycin     REACTION: Nausea  . Codeine     REACTION: ? reaction  . ZOX:[id number] Acid+Aspartame     REACTION: ? reaction  . Sulfonamide Derivatives     REACTION: ? reaction  . Tetracycline     REACTION: ? reaction    Current Outpatient Prescriptions on File Prior to Visit  Medication Sig Dispense Refill  . acetaminophen (TYLENOL) 650 MG CR tablet Take 1,300 mg by mouth 2 (two) times daily.        Marland Kitchen aspirin 81 MG tablet Take 81 mg by mouth daily.        . calcium carbonate (TUMS - DOSED IN MG ELEMENTAL CALCIUM) 500 MG chewable tablet Chew 3 tablets by mouth daily.        Marland Kitchen docusate sodium (COLACE) 100 MG capsule Take 100 mg by mouth 2 (two) times daily.        . Multiple Vitamin (MULTIVITAMIN) tablet Take 1 tablet by mouth daily.        . vitamin D, CHOLECALCIFEROL, 400 UNITS tablet Take 800 Units by mouth daily.        . hydrocortisone (WESTCORT) 0.2 % cream Apply to affected area in small mount once daily as needed.  15 g  3  . methylcellulose packet Take 1 each by mouth as needed.          Zoster status -- would like to have vaccine     Review of Systems Review of Systems  Constitutional: Negative for fever, appetite change, fatigue and unexpected weight change.  Eyes: Negative for pain and visual disturbance.  Respiratory: Negative for cough and shortness of breath.   Cardiovascular: Negative for cp or palpitations    Gastrointestinal: Negative for nausea, diarrhea and constipation.  Genitourinary: Negative for urgency and frequency.  Skin: Negative for pallor or rash   Neurological: Negative for weakness, light-headedness, numbness and headaches.  Hematological: Negative for adenopathy. Does not bruise/bleed easily.  Psychiatric/Behavioral: Negative for dysphoric mood. The patient is not nervous/anxious.          Objective:   Physical Exam  Constitutional: She appears well-developed and well-nourished. No distress.       Obese and well appearing   HENT:  Head: Normocephalic and atraumatic.  Right Ear: External ear normal.  Left Ear: External ear normal.  Nose: Nose normal.  Mouth/Throat: Oropharynx is clear and moist.  Eyes: Conjunctivae and EOM are normal. Pupils are equal,  round, and reactive to light. No scleral icterus.  Neck: Normal range of motion. Neck supple. No JVD  present. Carotid bruit is not present. No thyromegaly present.  Cardiovascular: Normal rate, regular rhythm and normal heart sounds.   No murmur heard. Pulmonary/Chest: Effort normal and breath sounds normal. No respiratory distress. She has no wheezes.  Abdominal: Soft. Bowel sounds are normal. She exhibits no distension, no abdominal bruit and no mass. There is no tenderness.  Musculoskeletal: Normal range of motion. She exhibits no edema and no tenderness.  Lymphadenopathy:    She has no cervical adenopathy.  Neurological: She is alert. She has normal reflexes. No cranial nerve deficit. She exhibits normal muscle tone. Coordination normal.  Skin: Skin is warm and dry. No rash noted. No erythema. No pallor.  Psychiatric: She has a normal mood and affect.          Assessment & Plan:

## 2011-11-10 NOTE — Assessment & Plan Note (Signed)
Cholesterol is overall a bit worse but well controlled  Disc goals for lipids and reasons to control them Rev labs with pt Rev low sat fat diet in detail  Given diet outline to follow- could do better

## 2011-11-10 NOTE — Assessment & Plan Note (Signed)
bp in fair control at this time  No changes needed  Disc lifstyle change with low sodium diet and exercise   

## 2011-11-17 ENCOUNTER — Other Ambulatory Visit: Payer: Self-pay | Admitting: *Deleted

## 2011-11-17 DIAGNOSIS — Z1211 Encounter for screening for malignant neoplasm of colon: Secondary | ICD-10-CM

## 2011-11-17 LAB — FECAL OCCULT BLOOD, IMMUNOCHEMICAL: Fecal Occult Bld: NEGATIVE

## 2011-11-18 ENCOUNTER — Encounter: Payer: Self-pay | Admitting: *Deleted

## 2011-11-19 ENCOUNTER — Ambulatory Visit (INDEPENDENT_AMBULATORY_CARE_PROVIDER_SITE_OTHER): Payer: Medicare Other | Admitting: Family Medicine

## 2011-11-19 DIAGNOSIS — Z2911 Encounter for prophylactic immunotherapy for respiratory syncytial virus (RSV): Secondary | ICD-10-CM

## 2011-11-19 DIAGNOSIS — Z23 Encounter for immunization: Secondary | ICD-10-CM

## 2011-11-19 NOTE — Progress Notes (Signed)
zostavax today per Rena.

## 2011-11-21 ENCOUNTER — Other Ambulatory Visit: Payer: Self-pay

## 2011-11-21 ENCOUNTER — Emergency Department (HOSPITAL_COMMUNITY): Payer: Medicare Other

## 2011-11-21 ENCOUNTER — Encounter (HOSPITAL_COMMUNITY): Payer: Self-pay

## 2011-11-21 ENCOUNTER — Inpatient Hospital Stay (HOSPITAL_COMMUNITY)
Admission: EM | Admit: 2011-11-21 | Discharge: 2011-11-23 | DRG: 191 | Disposition: A | Discharge status: Home / Self Care | Payer: Medicare Other | Attending: Family Medicine | Admitting: Family Medicine

## 2011-11-21 DIAGNOSIS — J441 Chronic obstructive pulmonary disease with (acute) exacerbation: Secondary | ICD-10-CM | POA: Diagnosis present

## 2011-11-21 DIAGNOSIS — M810 Age-related osteoporosis without current pathological fracture: Secondary | ICD-10-CM | POA: Diagnosis present

## 2011-11-21 DIAGNOSIS — IMO0002 Reserved for concepts with insufficient information to code with codable children: Secondary | ICD-10-CM

## 2011-11-21 DIAGNOSIS — M1990 Unspecified osteoarthritis, unspecified site: Secondary | ICD-10-CM | POA: Diagnosis present

## 2011-11-21 DIAGNOSIS — J209 Acute bronchitis, unspecified: Secondary | ICD-10-CM | POA: Diagnosis present

## 2011-11-21 DIAGNOSIS — Z9849 Cataract extraction status, unspecified eye: Secondary | ICD-10-CM

## 2011-11-21 DIAGNOSIS — M549 Dorsalgia, unspecified: Secondary | ICD-10-CM | POA: Diagnosis present

## 2011-11-21 DIAGNOSIS — R739 Hyperglycemia, unspecified: Secondary | ICD-10-CM | POA: Diagnosis present

## 2011-11-21 DIAGNOSIS — E119 Type 2 diabetes mellitus without complications: Secondary | ICD-10-CM | POA: Diagnosis present

## 2011-11-21 DIAGNOSIS — E876 Hypokalemia: Secondary | ICD-10-CM | POA: Diagnosis present

## 2011-11-21 DIAGNOSIS — Z6831 Body mass index (BMI) 31.0-31.9, adult: Secondary | ICD-10-CM

## 2011-11-21 DIAGNOSIS — J40 Bronchitis, not specified as acute or chronic: Secondary | ICD-10-CM | POA: Diagnosis present

## 2011-11-21 DIAGNOSIS — R079 Chest pain, unspecified: Secondary | ICD-10-CM | POA: Diagnosis present

## 2011-11-21 DIAGNOSIS — I10 Essential (primary) hypertension: Secondary | ICD-10-CM | POA: Diagnosis present

## 2011-11-21 DIAGNOSIS — Z853 Personal history of malignant neoplasm of breast: Secondary | ICD-10-CM

## 2011-11-21 DIAGNOSIS — R05 Cough: Secondary | ICD-10-CM | POA: Diagnosis present

## 2011-11-21 DIAGNOSIS — K219 Gastro-esophageal reflux disease without esophagitis: Secondary | ICD-10-CM | POA: Diagnosis present

## 2011-11-21 DIAGNOSIS — L309 Dermatitis, unspecified: Secondary | ICD-10-CM | POA: Diagnosis present

## 2011-11-21 DIAGNOSIS — E871 Hypo-osmolality and hyponatremia: Secondary | ICD-10-CM | POA: Diagnosis present

## 2011-11-21 DIAGNOSIS — E7800 Pure hypercholesterolemia, unspecified: Secondary | ICD-10-CM | POA: Diagnosis present

## 2011-11-21 DIAGNOSIS — Z85828 Personal history of other malignant neoplasm of skin: Secondary | ICD-10-CM

## 2011-11-21 DIAGNOSIS — R059 Cough, unspecified: Secondary | ICD-10-CM | POA: Diagnosis present

## 2011-11-21 DIAGNOSIS — T380X5A Adverse effect of glucocorticoids and synthetic analogues, initial encounter: Secondary | ICD-10-CM | POA: Diagnosis present

## 2011-11-21 DIAGNOSIS — D72829 Elevated white blood cell count, unspecified: Secondary | ICD-10-CM | POA: Diagnosis present

## 2011-11-21 DIAGNOSIS — Z8542 Personal history of malignant neoplasm of other parts of uterus: Secondary | ICD-10-CM

## 2011-11-21 DIAGNOSIS — Z79899 Other long term (current) drug therapy: Secondary | ICD-10-CM

## 2011-11-21 DIAGNOSIS — J309 Allergic rhinitis, unspecified: Secondary | ICD-10-CM | POA: Diagnosis present

## 2011-11-21 HISTORY — DX: Malignant neoplasm of uterus, part unspecified: C55

## 2011-11-21 HISTORY — DX: Unspecified osteoarthritis, unspecified site: M19.90

## 2011-11-21 LAB — URINALYSIS, ROUTINE W REFLEX MICROSCOPIC
Bilirubin Urine: NEGATIVE
Ketones, ur: NEGATIVE mg/dL
Specific Gravity, Urine: 1.013 (ref 1.005–1.030)
Urobilinogen, UA: 0.2 mg/dL (ref 0.0–1.0)
pH: 6 (ref 5.0–8.0)

## 2011-11-21 LAB — CBC
HCT: 41.2 % (ref 36.0–46.0)
HCT: 38.2 % (ref 36.0–46.0)
Hemoglobin: 14.5 g/dL (ref 12.0–15.0)
MCH: 32 pg (ref 26.0–34.0)
MCHC: 35.9 g/dL (ref 30.0–36.0)
MCV: 90.2 fL (ref 78.0–100.0)
MCV: 89.3 fL (ref 78.0–100.0)
Platelets: 263 10*3/uL (ref 150–400)
RBC: 4.57 MIL/uL (ref 3.87–5.11)
RDW: 13.4 % (ref 11.5–15.5)
RDW: 13.4 % (ref 11.5–15.5)
WBC: 18.6 10*3/uL — ABNORMAL HIGH (ref 4.0–10.5)

## 2011-11-21 LAB — COMPREHENSIVE METABOLIC PANEL
Albumin: 4.1 g/dL (ref 3.5–5.2)
Alkaline Phosphatase: 71 U/L (ref 39–117)
BUN: 18 mg/dL (ref 6–23)
CO2: 27 mEq/L (ref 19–32)
Chloride: 92 mEq/L — ABNORMAL LOW (ref 96–112)
Creatinine, Ser: 0.72 mg/dL (ref 0.50–1.10)
GFR calc non Af Amer: 78 mL/min — ABNORMAL LOW (ref >90)
Potassium: 3.4 mEq/L — ABNORMAL LOW (ref 3.5–5.1)
Total Bilirubin: 0.6 mg/dL (ref 0.3–1.2)

## 2011-11-21 LAB — GLUCOSE, CAPILLARY
Glucose-Capillary: 179 mg/dL — ABNORMAL HIGH (ref 70–99)
Glucose-Capillary: 162 mg/dL — ABNORMAL HIGH (ref 70–99)

## 2011-11-21 LAB — URINE MICROSCOPIC-ADD ON

## 2011-11-21 LAB — POCT I-STAT TROPONIN I: Troponin i, poc: 0 ng/mL (ref 0.00–0.08)

## 2011-11-21 LAB — CARDIAC PANEL(CRET KIN+CKTOT+MB+TROPI)
CK, MB: 2 ng/mL (ref 0.3–4.0)
Relative Index: INVALID (ref 0.0–2.5)
Total CK: 96 U/L (ref 7–177)
Troponin I: <0.3 ng/mL (ref <0.30)

## 2011-11-21 LAB — CREATININE, SERUM: GFR calc Af Amer: >90 mL/min (ref >90)

## 2011-11-21 MED ORDER — DOCUSATE SODIUM 100 MG PO CAPS
100.0000 mg | ORAL_CAPSULE | Freq: Two times a day (BID) | ORAL | Status: DC
  Administered 2011-11-21 – 2011-11-22 (×3): 100 mg via ORAL
  Filled 2011-11-21 (×5): qty 1

## 2011-11-21 MED ORDER — DEXTROSE 5 % IV SOLN
1.0000 g | INTRAVENOUS | Status: DC
  Administered 2011-11-21 – 2011-11-22 (×2): 1 g via INTRAVENOUS
  Filled 2011-11-21 (×3): qty 10

## 2011-11-21 MED ORDER — FENTANYL CITRATE 0.05 MG/ML IJ SOLN: 50.0000 ug | INTRAMUSCULAR | Status: DC | PRN

## 2011-11-21 MED ORDER — ACETAMINOPHEN 325 MG PO TABS
650.0000 mg | ORAL_TABLET | Freq: Four times a day (QID) | ORAL | Status: DC | PRN
  Administered 2011-11-22 (×2): 650 mg via ORAL
  Filled 2011-11-21 (×2): qty 2

## 2011-11-21 MED ORDER — ALBUTEROL SULFATE (5 MG/ML) 0.5% IN NEBU
2.5000 mg | INHALATION_SOLUTION | RESPIRATORY_TRACT | Status: DC
  Administered 2011-11-21 – 2011-11-22 (×4): 2.5 mg via RESPIRATORY_TRACT
  Filled 2011-11-21 (×5): qty 0.5

## 2011-11-21 MED ORDER — ONDANSETRON HCL 4 MG PO TABS: 4.0000 mg | ORAL_TABLET | Freq: Four times a day (QID) | ORAL | Status: DC | PRN

## 2011-11-21 MED ORDER — SODIUM CHLORIDE 0.9 % IV SOLN
Freq: Once | INTRAVENOUS | Status: AC
  Administered 2011-11-21: 75 mL/h via INTRAVENOUS

## 2011-11-21 MED ORDER — GI COCKTAIL ~~LOC~~
30.0000 mL | Freq: Once | ORAL | Status: AC
  Administered 2011-11-21: 30 mL via ORAL
  Filled 2011-11-21: qty 30

## 2011-11-21 MED ORDER — CALCIUM CARBONATE ANTACID 500 MG PO CHEW
3.0000 | CHEWABLE_TABLET | Freq: Every day | ORAL | Status: DC
  Administered 2011-11-22: 600 mg via ORAL
  Filled 2011-11-21 (×2): qty 3

## 2011-11-21 MED ORDER — SODIUM CHLORIDE 0.9 % IV SOLN: INTRAVENOUS | Status: DC

## 2011-11-21 MED ORDER — IPRATROPIUM BROMIDE 0.02 % IN SOLN
0.5000 mg | RESPIRATORY_TRACT | Status: DC
  Administered 2011-11-21 – 2011-11-22 (×4): 0.5 mg via RESPIRATORY_TRACT
  Filled 2011-11-21 (×5): qty 2.5

## 2011-11-21 MED ORDER — DILTIAZEM HCL ER COATED BEADS 240 MG PO CP24
240.0000 mg | ORAL_CAPSULE | Freq: Every day | ORAL | Status: DC
  Administered 2011-11-22: 240 mg via ORAL
  Filled 2011-11-21 (×2): qty 1

## 2011-11-21 MED ORDER — ASPIRIN 81 MG PO CHEW
324.0000 mg | CHEWABLE_TABLET | Freq: Once | ORAL | Status: AC
  Administered 2011-11-21: 324 mg via ORAL
  Filled 2011-11-21: qty 4

## 2011-11-21 MED ORDER — ONDANSETRON HCL 4 MG/2ML IJ SOLN: 4.0000 mg | Freq: Four times a day (QID) | INTRAMUSCULAR | Status: DC | PRN

## 2011-11-21 MED ORDER — SENNOSIDES-DOCUSATE SODIUM 8.6-50 MG PO TABS
1.0000 | ORAL_TABLET | Freq: Every evening | ORAL | Status: DC | PRN
  Filled 2011-11-21: qty 1

## 2011-11-21 MED ORDER — ASPIRIN EC 81 MG PO TBEC
81.0000 mg | DELAYED_RELEASE_TABLET | Freq: Every day | ORAL | Status: DC
  Administered 2011-11-22: 81 mg via ORAL
  Filled 2011-11-21 (×2): qty 1

## 2011-11-21 MED ORDER — ENOXAPARIN SODIUM 40 MG/0.4ML ~~LOC~~ SOLN
40.0000 mg | SUBCUTANEOUS | Status: DC
  Administered 2011-11-21 – 2011-11-22 (×2): 40 mg via SUBCUTANEOUS
  Filled 2011-11-21 (×3): qty 0.4

## 2011-11-21 MED ORDER — ADULT MULTIVITAMIN W/MINERALS CH
1.0000 | ORAL_TABLET | Freq: Every day | ORAL | Status: DC
  Administered 2011-11-22: 1 via ORAL
  Filled 2011-11-21 (×2): qty 1

## 2011-11-21 MED ORDER — FENTANYL CITRATE 0.05 MG/ML IJ SOLN
50.0000 ug | Freq: Once | INTRAMUSCULAR | Status: AC
  Administered 2011-11-21: 50 ug via INTRAVENOUS
  Filled 2011-11-21: qty 2

## 2011-11-21 MED ORDER — NITROGLYCERIN 2 % TD OINT
0.5000 [in_us] | TOPICAL_OINTMENT | Freq: Once | TRANSDERMAL | Status: DC
  Administered 2011-11-21: 0.5 [in_us] via TOPICAL

## 2011-11-21 MED ORDER — POTASSIUM CHLORIDE IN NACL 20-0.9 MEQ/L-% IV SOLN
INTRAVENOUS | Status: DC
  Administered 2011-11-21 – 2011-11-22 (×3): via INTRAVENOUS
  Filled 2011-11-21 (×6): qty 1000

## 2011-11-21 MED ORDER — ACETAMINOPHEN 650 MG RE SUPP: 650.0000 mg | Freq: Four times a day (QID) | RECTAL | Status: DC | PRN

## 2011-11-21 MED ORDER — INSULIN ASPART 100 UNIT/ML ~~LOC~~ SOLN
0.0000 [IU] | Freq: Three times a day (TID) | SUBCUTANEOUS | Status: DC
  Administered 2011-11-21 – 2011-11-22 (×2): 3 [IU] via SUBCUTANEOUS
  Administered 2011-11-22: 2 [IU] via SUBCUTANEOUS
  Filled 2011-11-21: qty 3

## 2011-11-21 MED ORDER — NITROGLYCERIN 0.4 MG SL SUBL
0.4000 mg | SUBLINGUAL_TABLET | SUBLINGUAL | Status: DC | PRN
  Administered 2011-11-21 (×2): 0.4 mg via SUBLINGUAL
  Filled 2011-11-21: qty 25

## 2011-11-21 MED ORDER — PANTOPRAZOLE SODIUM 40 MG PO TBEC
40.0000 mg | DELAYED_RELEASE_TABLET | Freq: Every day | ORAL | Status: DC
  Administered 2011-11-22: 40 mg via ORAL
  Filled 2011-11-21: qty 1

## 2011-11-21 MED ORDER — ROPINIROLE HCL 1 MG PO TABS
1.0000 mg | ORAL_TABLET | Freq: Every day | ORAL | Status: DC
  Administered 2011-11-21: 1 mg via ORAL
  Filled 2011-11-21 (×2): qty 1

## 2011-11-21 MED ORDER — METHYLPREDNISOLONE SODIUM SUCC 125 MG IJ SOLR
80.0000 mg | Freq: Two times a day (BID) | INTRAMUSCULAR | Status: AC
  Administered 2011-11-21: 19:00:00 via INTRAVENOUS
  Administered 2011-11-22: 80 mg via INTRAVENOUS
  Filled 2011-11-21 (×2): qty 1.28

## 2011-11-21 MED ORDER — MORPHINE SULFATE 2 MG/ML IJ SOLN: 1.0000 mg | INTRAMUSCULAR | Status: DC | PRN

## 2011-11-21 NOTE — ED Notes (Signed)
Patient aware we need urine sample. Will try once she is done eating.

## 2011-11-21 NOTE — ED Provider Notes (Signed)
Medical screening examination/treatment/procedure(s) were conducted as a shared visit with non-physician practitioner(s) and myself.  I personally evaluated the patient during the encounter  78yo female with a history of hypertension and diabetes but no documented coronary artery disease woke up this morning about 5 in the morning with a vague chest tightness heaviness feeling across her left anterior chest relating to her neck and her left shoulder no associated symptoms and no treatment prior to arrival her symptoms are mild in waxing and waning but not gone yet upon arrival to the ED. She has not had discomfort like this before. Her primary care doctor is at [facility] and she does not have a cardiologist.  ECG: Sinus tachycardia, ventricular rate 105, normal axis, normal intervals, no acute ischemic changes noted, no significant change compared to February 2009  Hurman Horn, MD 11/27/11 [phone]

## 2011-11-21 NOTE — ED Notes (Signed)
Pt resting comfortably, pt denies any chest pain, family is at the bedside. VSS

## 2011-11-21 NOTE — H&P (Signed)
History and Physical Examination  Date: 11/21/2011  Patient name: [name] Medical record number: [id number] Date of birth: Jan 26, 1927 Age: 78 y.o. Gender: female PCP: Roxy Manns, MD, MD  Attending physician: Cleora Fleet, MD  Chief Complaint:  Chief Complaint  Patient presents with  . Chest Pain     History of Present Illness: [name] is an 78 y.o. female with a PMH significant for HTN, GERD, breast cancer, DM (2), who presented to the ER today after she began to experience some uncomfortable chest pain and pressure.  She says that symptoms started at rest around 5am.  It is described as a heavy sensation located in her left chest and radiating through to her back. She also notices a "choking" sensation in her throat and some radiation into her shoulder. Denies any diaphoresis or dyspnea. She has not had any nausea or vomiting. Denies abdominal pain. Has never had anything like this before and became concerned.   Pain radiated into her jaw and left shoulder.  No radiation into the back.  Pt says that she also has a chronic cough and wheezing that she has been experiencing over the last week.  She was found to have an elevated WBC count and was found to have a normal chest xray.  Hospital admission was requested for further evaluation and management.    Past Medical History Past Medical History  Diagnosis Date  . HX: breast cancer 12/99  . HTN (hypertension)   . GERD (gastroesophageal reflux disease)   . Hyperglycemia   . Allergic rhinitis   . Incontinence   . Chronic cough   . Compression fracture   . Diverticulosis   . Hemorrhoids   . Endometrial cancer   . Personal history of skin cancer     Basal cell  . Osteoporosis     fosamax in past/ followed by Dr Ophelia Charter     Past Surgical History Past Surgical History  Procedure Date  . Total abdominal hysterectomy w/ bilateral salpingoophorectomy     endometrial cancer  . Mastectomy     Left  . Back surgery 2007   Dr. Ophelia Charter -disk  . Skin cancer excision 10/08    basal cell  . Cholecystectomy 2009  . Cataract extraction 7/10  . Dexa 2/00    LS osteoporsis  . Dexa 2/02    same; OP, LS, spine, hip  . Dexa 3/04    BMD spine; low BMD hip  . Colonoscopy 3/02    Diverticulosis; hemorrhoids    Home Meds: Prior to Admission medications   Medication Sig Start Date End Date Taking? Authorizing Provider  acetaminophen (TYLENOL) 650 MG CR tablet Take 1,300 mg by mouth 2 (two) times daily.     Yes Historical Provider, MD  aspirin EC 81 MG tablet Take 81 mg by mouth daily.   Yes Historical Provider, MD  calcium carbonate (TUMS - DOSED IN MG ELEMENTAL CALCIUM) 500 MG chewable tablet Chew 3 tablets by mouth daily.    Yes Historical Provider, MD  diltiazem (CARDIZEM CD) 240 MG 24 hr capsule Take 240 mg by mouth daily. 11/10/11  Yes Judy Pimple, MD  docusate sodium (COLACE) 100 MG capsule Take 100 mg by mouth 2 (two) times daily.     Yes Historical Provider, MD  hydrochlorothiazide (HYDRODIURIL) 50 MG tablet Take 50 mg by mouth daily. 11/10/11  Yes Judy Pimple, MD  hydrocortisone valerate cream (WESTCORT) 0.2 % Apply 1 application topically daily as needed.  To face.    Yes Historical Provider, MD  Multiple Vitamin (MULITIVITAMIN WITH MINERALS) TABS Take 1 tablet by mouth daily.   Yes Historical Provider, MD  omeprazole (PRILOSEC) 40 MG capsule Take 40 mg by mouth daily. 11/10/11  Yes Judy Pimple, MD  rOPINIRole (REQUIP) 1 MG tablet Take 1 mg by mouth daily. 11/10/11  Yes Judy Pimple, MD  vitamin D, CHOLECALCIFEROL, 400 UNITS tablet Take 800 Units by mouth daily.     Yes Historical Provider, MD    Allergies: Clarithromycin; Codeine; ZOX:[id number]+AOZHYQMVHQ acid+aspartame; Sulfonamide derivatives; and Tetracycline  Social History:  History   Social History  . Marital Status: Single    Spouse Name: N/A    Number of Children: N/A  . Years of Education: N/A   Occupational History  . Not  on file.   Social History Main Topics  . Smoking status: Never Smoker   . Smokeless tobacco: Not on file  . Alcohol Use: No  . Drug Use: No  . Sexually Active: Not on file   Other Topics Concern  . Not on file   Social History Narrative   Goes to [facility] 3 days/week for exercise   Family History:  Family History  Problem Relation Age of Onset  . Colon cancer Brother     Review of Systems: Constitutional: positive for fevers and malaise Eyes: negative Respiratory: positive for cough, dyspnea on exertion, sputum and wheezing Cardiovascular: positive for chest pain, chest pressure/discomfort, dyspnea, exertional chest pressure/discomfort and fatigue Gastrointestinal: negative for abdominal pain, change in bowel habits, constipation and diarrhea Genitourinary:negative Integument/breast: negative for pruritus, rash, skin color change and skin lesion(s) Hematologic/lymphatic: positive for easy bruising and history of breast cancer Musculoskeletal:negative for arthralgias and muscle weakness Neurological: negative for dizziness, headaches, memory problems and speech problems Behavioral/Psych: negative for anxiety and behavior problems Endocrine: positive for diabetic symptoms including blurry vision, increased fatigue and skin dryness Allergic/Immunologic: positive for hay fever All other systems reviewed and reported as negative.   Physical Exam: Blood pressure 145/43, pulse 105, temperature 99.8 F (37.7 C), temperature source Oral, resp. rate 20, height 5' (1.524 m), weight 74.1 kg (163 lb 5.8 oz), SpO2 91.00%. General appearance: alert, cooperative, appears stated age and no distress Head: Normocephalic, without obvious abnormality, atraumatic Eyes: conjunctivae/corneas clear. PERRL, EOM's intact. Fundi benign. Nose: Nares normal. Septum midline. Mucosa normal. No drainage or sinus tenderness., no discharge Throat: lips, mucosa, and tongue normal; teeth and gums normal Neck: no  adenopathy, no carotid bruit, no JVD, supple, symmetrical, trachea midline and thyroid not enlarged, symmetric, no tenderness/mass/nodules Lungs: wheezes anterior - bilateral Heart: S1, S2 normal Abdomen: soft, non-tender; bowel sounds normal; no masses,  no organomegaly Extremities: extremities normal, atraumatic, no cyanosis or edema Pulses: 2+ and symmetric Skin: Skin color, texture, turgor normal. No rashes or lesions Neurologic: Grossly normal  Lab  And Imaging results:  Results for orders placed during the hospital encounter of 11/21/11 (from the past 24 hour(s))  CBC     Status: Abnormal   Collection Time   11/21/11  9:40 AM      Component Value Range   WBC 18.6 (*) 4.0 - 10.5 (K/uL)   RBC 4.57  3.87 - 5.11 (MIL/uL)   Hemoglobin 14.5  12.0 - 15.0 (g/dL)   HCT 46.9  62.9 - 52.8 (%)   MCV 90.2  78.0 - 100.0 (fL)   MCH 31.7  26.0 - 34.0 (pg)   MCHC 35.2  30.0 - 36.0 (g/dL)  RDW 13.4  11.5 - 15.5 (%)   Platelets 278  150 - 400 (K/uL)  COMPREHENSIVE METABOLIC PANEL     Status: Abnormal   Collection Time   11/21/11  9:40 AM      Component Value Range   Sodium 131 (*) 135 - 145 (mEq/L)   Potassium 3.4 (*) 3.5 - 5.1 (mEq/L)   Chloride 92 (*) 96 - 112 (mEq/L)   CO2 27  19 - 32 (mEq/L)   Glucose, Bld 124 (*) 70 - 99 (mg/dL)   BUN 18  6 - 23 (mg/dL)   Creatinine, Ser 1.61  0.50 - 1.10 (mg/dL)   Calcium 9.8  8.4 - 09.6 (mg/dL)   Total Protein 8.0  6.0 - 8.3 (g/dL)   Albumin 4.1  3.5 - 5.2 (g/dL)   AST 64 (*) 0 - 37 (U/L)   ALT 51 (*) 0 - 35 (U/L)   Alkaline Phosphatase 71  39 - 117 (U/L)   Total Bilirubin 0.6  0.3 - 1.2 (mg/dL)   GFR calc non Af Amer 78 (*) >90 (mL/min)   GFR calc Af Amer >90  >90 (mL/min)  POCT I-STAT TROPONIN I     Status: Normal   Collection Time   11/21/11  9:42 AM      Component Value Range   Troponin i, poc 0.00  0.00 - 0.08 (ng/mL)   Comment 3           POCT I-STAT TROPONIN I     Status: Normal   Collection Time   11/21/11 12:59 PM      Component  Value Range   Troponin i, poc 0.01  0.00 - 0.08 (ng/mL)   Comment 3           GLUCOSE, CAPILLARY     Status: Abnormal   Collection Time   11/21/11  2:27 PM      Component Value Range   Glucose-Capillary 179 (*) 70 - 99 (mg/dL)   Comment 1 Notify RN     EKG Results:  Orders placed during the hospital encounter of 11/21/11  . EKG 12-LEAD  . EKG 12-LEAD     Impression   Chest pain  Pure hypercholesterolemia  HYPERTENSION  ALLERGIC RHINITIS  GERD  OSTEOARTHRITIS  BACK PAIN  Diabetes mellitus type 2, diet-controlled  Cough  Dermatitis of face  Bronchitis with bronchospasm  Hyponatremia  Hypokalemia   Plan  The patient is going to be admitted to telemetry for cardiac monitoring.  Will cycle cardiac enzymes to rule out myocardial ischemia. Will treat her URI/bronchitis with antibiotics and nebulizer treatments.  For the hyponatremia, will start NS, d/c the 50 mg of hydrochlorothiazide, replace potassium, resume some home meds for blood pressure, NTG paste, morphine, oxygen, aspirin ordered.  Please see orders.    Standley Dakins MD [facility] [place], [place] [phone] 11/21/2011, 2:55 PM

## 2011-11-21 NOTE — ED Provider Notes (Signed)
History     CSN: [id number]  Arrival date & time 11/21/11  4098   First MD Initiated Contact with Patient 11/21/11 [phone]      Chief Complaint  Patient presents with  . Chest Pain    (Consider location/radiation/quality/duration/timing/severity/associated sxs/prior treatment) HPI History obtained from the patient. 78 year old female with past medical history significant for breast cancer, hypertension, hyperglycemia but without known history of coronary artery disease presents with chest pain. She states this started around 5 AM this morning after she woke up. It is described as a heavy sensation located in her left chest and radiating through to her back. She also notices a "choking" sensation in her throat and some radiation into her shoulder. Denies any diaphoresis or dyspnea. She has not had any nausea or vomiting. Denies abdominal pain. Has never had anything like this before. Did not take any medications at home prior to arrival. Denies recent URI symptoms.  Past Medical History  Diagnosis Date  . HX: breast cancer 12/99  . HTN (hypertension)   . GERD (gastroesophageal reflux disease)   . Hyperglycemia   . Allergic rhinitis   . Incontinence   . Chronic cough   . Compression fracture   . Diverticulosis   . Hemorrhoids   . Endometrial cancer   . Personal history of skin cancer     Basal cell  . Osteoporosis     fosamax in past/ followed by Dr Ophelia Charter     Past Surgical History  Procedure Date  . Total abdominal hysterectomy w/ bilateral salpingoophorectomy     endometrial cancer  . Mastectomy     Left  . Back surgery 2007    Dr. Ophelia Charter -disk  . Skin cancer excision 10/08    basal cell  . Cholecystectomy 2009  . Cataract extraction 7/10  . Dexa 2/00    LS osteoporsis  . Dexa 2/02    same; OP, LS, spine, hip  . Dexa 3/04    BMD spine; low BMD hip  . Colonoscopy 3/02    Diverticulosis; hemorrhoids    Family History  Problem Relation Age of Onset  . Colon cancer  Brother     History  Substance Use Topics  . Smoking status: Never Smoker   . Smokeless tobacco: Not on file  . Alcohol Use: No    OB History    Grav Para Term Preterm Abortions TAB SAB Ect Mult Living                  Review of Systems  Constitutional: Negative for fever, chills, activity change and appetite change.  HENT: Negative for congestion, sore throat and rhinorrhea.   Eyes: Negative for photophobia.  Respiratory: Negative for cough, chest tightness and shortness of breath.   Cardiovascular: Positive for chest pain. Negative for palpitations and leg swelling.  Gastrointestinal: Negative for nausea, vomiting, abdominal pain and diarrhea.  Musculoskeletal: Negative for myalgias.  Skin: Negative for color change and rash.  Neurological: Negative for dizziness and weakness.    Allergies  Clarithromycin; Codeine; YNW:[id number]+HQIONGEXB+MWUXLKGMWN acid+aspartame; Sulfonamide derivatives; and Tetracycline  Home Medications   Current Outpatient Rx  Name Route Sig Dispense Refill  . ACETAMINOPHEN ER 650 MG PO TBCR Oral Take 1,300 mg by mouth 2 (two) times daily.      . ASPIRIN EC 81 MG PO TBEC Oral Take 81 mg by mouth daily.    Marland Kitchen CALCIUM CARBONATE ANTACID 500 MG PO CHEW Oral Chew 3 tablets by mouth daily.     Marland Kitchen  DILTIAZEM HCL ER COATED BEADS 240 MG PO CP24 Oral Take 240 mg by mouth daily.    Marland Kitchen DOCUSATE SODIUM 100 MG PO CAPS Oral Take 100 mg by mouth 2 (two) times daily.      Marland Kitchen HYDROCHLOROTHIAZIDE 50 MG PO TABS Oral Take 50 mg by mouth daily.    Marland Kitchen HYDROCORTISONE VALERATE 0.2 % EX CREA Topical Apply 1 application topically daily as needed. To affected area     . ADULT MULTIVITAMIN W/MINERALS CH Oral Take 1 tablet by mouth daily.    Marland Kitchen OMEPRAZOLE 40 MG PO CPDR Oral Take 40 mg by mouth daily.    Marland Kitchen ROPINIROLE HCL 1 MG PO TABS Oral Take 1 mg by mouth daily.    Marland Kitchen VITAMIN D 400 UNITS PO TABS Oral Take 800 Units by mouth daily.      . METHYLCELLULOSE (LAXATIVE) PO PACK Oral  Take 1 each by mouth as needed. For constipation.      BP 179/54  Pulse 103  Temp(Src) 98.2 F (36.8 C) (Oral)  Resp 16  SpO2 95%  Physical Exam  Nursing note and vitals reviewed. Constitutional: She appears well-developed and well-nourished. No distress.  HENT:  Head: Normocephalic and atraumatic.  Right Ear: External ear normal.  Left Ear: External ear normal.  Mouth/Throat: Oropharynx is clear and moist.  Eyes: EOM are normal. Pupils are equal, round, and reactive to light.  Neck: Normal range of motion.  Cardiovascular: Normal rate and normal heart sounds.   Pulmonary/Chest: Effort normal and breath sounds normal.       Mildly tender to palp over L pectoralis  Abdominal: Soft. Bowel sounds are normal. She exhibits no mass. There is no tenderness. There is no rebound and no guarding.  Musculoskeletal: Normal range of motion. She exhibits no edema.  Neurological: She is alert.  Skin: Skin is warm and dry. She is not diaphoretic.  Psychiatric: She has a normal mood and affect.    ED Course  Procedures (including critical care time)  Labs Reviewed  CBC - Abnormal; Notable for the following:    WBC 18.6 (*)    All other components within normal limits  COMPREHENSIVE METABOLIC PANEL - Abnormal; Notable for the following:    Sodium 131 (*)    Potassium 3.4 (*)    Chloride 92 (*)    Glucose, Bld 124 (*)    AST 64 (*)    ALT 51 (*)    GFR calc non Af Amer 78 (*)    All other components within normal limits  POCT I-STAT TROPONIN I   Dg Chest Port 1 View  11/21/2011  *RADIOLOGY REPORT*  Clinical Data: Chest heaviness.  Heart palpitations.  PORTABLE CHEST - 1 VIEW  Comparison: Chest x-ray 04/15/2011.  Findings: Lung volumes are low.  There are bibasilar opacities, favored to represent areas of subsegmental atelectasis.  No definite pleural effusions.  Crowding of the pulmonary vasculature (accentuated by the patient's low lung volumes), without frank pulmonary edema.  Heart  size is normal.  Mediastinal contours are unremarkable.  Atherosclerosis in the thoracic aorta.  Surgical clips in the left axillary region, suggesting prior left axillary nodal dissection.  IMPRESSION: 1.  Low lung volumes with bibasilar subsegmental atelectasis. 2.  Atherosclerosis.  Original Report Authenticated By: Florencia Reasons, M.D.   I personally reviewed imaging studies.   1. Chest pain       MDM  Patient with several hours of chest pressure which is described as heaviness. No  other associated symptoms. It is not positional or pleuritic in nature, does not worsen with movement. Initial troponin is negative. Patient is noted to have a white count of 18.6 with slightly elevated LFTs. Chest x-ray does not show any obvious evidence for acute cardiopulmonary abnormality. EKG unchanged from previous. Patient was initially given nitroglycerin for pain which did not help. She was remedicated with fentanyl and a GI cocktail which achieved analgesia. Given the nature of her pain and risk factors including age, pre-diabetes, and hypertension, will admit for further eval and tx. Discussed with pt who is agreeable.  12:11 PM I've discussed with hospitalist with [facility]. Will admit.        Grant Fontana, [place] 11/21/11 [phone]

## 2011-11-21 NOTE — ED Notes (Signed)
Attempted to call report but the receiving RN is doing patient care at present

## 2011-11-21 NOTE — ED Notes (Signed)
Lt. Side chest pain, started last night. Did not take any meds for pain.

## 2011-11-22 LAB — BASIC METABOLIC PANEL
BUN: 17 mg/dL (ref 6–23)
Chloride: 97 mEq/L (ref 96–112)
GFR calc Af Amer: 89 mL/min — ABNORMAL LOW (ref >90)
Glucose, Bld: 193 mg/dL — ABNORMAL HIGH (ref 70–99)
Potassium: 3.5 mEq/L (ref 3.5–5.1)

## 2011-11-22 LAB — CBC
HCT: 36.9 % (ref 36.0–46.0)
Hemoglobin: 12.9 g/dL (ref 12.0–15.0)
RBC: 4.12 MIL/uL (ref 3.87–5.11)
RDW: 13.9 % (ref 11.5–15.5)
WBC: 23 10*3/uL — ABNORMAL HIGH (ref 4.0–10.5)

## 2011-11-22 LAB — CARDIAC PANEL(CRET KIN+CKTOT+MB+TROPI)
CK, MB: 1.6 ng/mL (ref 0.3–4.0)
CK, MB: 2.1 ng/mL (ref 0.3–4.0)
Relative Index: INVALID (ref 0.0–2.5)
Total CK: 79 U/L (ref 7–177)
Total CK: 92 U/L (ref 7–177)
Troponin I: <0.3 ng/mL (ref <0.30)

## 2011-11-22 LAB — GLUCOSE, CAPILLARY

## 2011-11-22 MED ORDER — ROPINIROLE HCL 1 MG PO TABS
1.0000 mg | ORAL_TABLET | Freq: Every day | ORAL | Status: DC
  Administered 2011-11-22: 1 mg via ORAL
  Filled 2011-11-22 (×2): qty 1

## 2011-11-22 MED ORDER — IPRATROPIUM BROMIDE 0.02 % IN SOLN: 0.5000 mg | RESPIRATORY_TRACT | Status: DC

## 2011-11-22 MED ORDER — ALBUTEROL SULFATE (5 MG/ML) 0.5% IN NEBU
2.5000 mg | INHALATION_SOLUTION | Freq: Three times a day (TID) | RESPIRATORY_TRACT | Status: DC
  Administered 2011-11-22 – 2011-11-23 (×2): 2.5 mg via RESPIRATORY_TRACT
  Filled 2011-11-22 (×3): qty 0.5

## 2011-11-22 MED ORDER — ALBUTEROL SULFATE (5 MG/ML) 0.5% IN NEBU
2.5000 mg | INHALATION_SOLUTION | RESPIRATORY_TRACT | Status: DC | PRN
  Administered 2011-11-22: 2.5 mg via RESPIRATORY_TRACT

## 2011-11-22 MED ORDER — ALBUTEROL SULFATE (5 MG/ML) 0.5% IN NEBU: 2.5000 mg | INHALATION_SOLUTION | Freq: Three times a day (TID) | RESPIRATORY_TRACT | Status: DC

## 2011-11-22 MED ORDER — GUAIFENESIN ER 600 MG PO TB12
600.0000 mg | ORAL_TABLET | Freq: Two times a day (BID) | ORAL | Status: DC | PRN
  Administered 2011-11-22: 600 mg via ORAL
  Filled 2011-11-22 (×2): qty 1

## 2011-11-22 MED ORDER — IPRATROPIUM BROMIDE 0.02 % IN SOLN
0.5000 mg | Freq: Three times a day (TID) | RESPIRATORY_TRACT | Status: DC
  Administered 2011-11-22 – 2011-11-23 (×2): 0.5 mg via RESPIRATORY_TRACT
  Filled 2011-11-22 (×3): qty 2.5

## 2011-11-22 MED ORDER — METHYLPREDNISOLONE SODIUM SUCC 125 MG IJ SOLR
80.0000 mg | Freq: Two times a day (BID) | INTRAMUSCULAR | Status: AC
  Administered 2011-11-22 (×2): 80 mg via INTRAVENOUS

## 2011-11-22 NOTE — Progress Notes (Signed)
[facility] Inpatient Progress Note  11/22/2011  Subjective: Pt still coughing up colored sputum but no further chest pain.  She says that the breathing treatments have helped.   Objective:  Vital signs in last 24 hours: Filed Vitals:   11/21/11 1951 11/21/11 2130 11/22/11 0600 11/22/11 0902  BP:  144/45 186/65   Pulse:  100 108   Temp:  98.9 F (37.2 C) 98.8 F (37.1 C)   TempSrc:      Resp:  18 24   Height:      Weight:      SpO2: 93% 91% 94% 95%   Weight change:   Intake/Output Summary (Last 24 hours) at 11/22/11 1250 Last data filed at 11/22/11 0800  Gross per 24 hour  Intake    240 ml  Output      0 ml  Net    240 ml   Lab Results  Component Value Date   HGBA1C 6.3* 11/21/2011   HGBA1C 6.5 11/01/2011   HGBA1C 6.4 05/04/2011   Lab Results  Component Value Date   MICROALBUR 1.1 11/03/2009   LDLCALC 42 11/22/2011   CREATININE 0.71 11/21/2011    Review of Systems As above, otherwise all reviewed and reported negative  Physical Exam   Lab Results: Results for orders placed during the hospital encounter of 11/21/11 (from the past 24 hour(s))  POCT I-STAT TROPONIN I     Status: Normal   Collection Time   11/21/11 12:59 PM      Component Value Range   Troponin i, poc 0.01  0.00 - 0.08 (ng/mL)   Comment 3           GLUCOSE, CAPILLARY     Status: Abnormal   Collection Time   11/21/11  2:27 PM      Component Value Range   Glucose-Capillary 179 (*) 70 - 99 (mg/dL)   Comment 1 Notify RN    CARDIAC PANEL(CRET KIN+CKTOT+MB+TROPI)     Status: Normal   Collection Time   11/21/11  4:09 PM      Component Value Range   Total CK 96  7 - 177 (U/L)   CK, MB 2.0  0.3 - 4.0 (ng/mL)   Troponin I <0.30  <0.30 (ng/mL)   Relative Index RELATIVE INDEX IS INVALID  0.0 - 2.5   CBC     Status: Abnormal   Collection Time   11/21/11  4:13 PM      Component Value Range   WBC 23.9 (*) 4.0 - 10.5 (K/uL)   RBC 4.28  3.87 - 5.11 (MIL/uL)   Hemoglobin 13.7  12.0 - 15.0 (g/dL)     HCT 16.1  09.6 - 04.5 (%)   MCV 89.3  78.0 - 100.0 (fL)   MCH 32.0  26.0 - 34.0 (pg)   MCHC 35.9  30.0 - 36.0 (g/dL)   RDW 40.9  81.1 - 91.4 (%)   Platelets 263  150 - 400 (K/uL)  CREATININE, SERUM     Status: Abnormal   Collection Time   11/21/11  4:13 PM      Component Value Range   Creatinine, Ser 0.71  0.50 - 1.10 (mg/dL)   GFR calc non Af Amer 78 (*) >90 (mL/min)   GFR calc Af Amer >90  >90 (mL/min)  HEMOGLOBIN A1C     Status: Abnormal   Collection Time   11/21/11  4:13 PM      Component Value Range   Hemoglobin A1C 6.3 (*) <  5.7 (%)   Mean Plasma Glucose 134 (*) <117 (mg/dL)  GLUCOSE, CAPILLARY     Status: Abnormal   Collection Time   11/21/11  4:53 PM      Component Value Range   Glucose-Capillary 162 (*) 70 - 99 (mg/dL)   Comment 1 Notify RN    URINALYSIS, ROUTINE W REFLEX MICROSCOPIC     Status: Abnormal   Collection Time   11/21/11  9:03 PM      Component Value Range   Color, Urine YELLOW  YELLOW    APPearance CLEAR  CLEAR    Specific Gravity, Urine 1.013  1.005 - 1.030    pH 6.0  5.0 - 8.0    Glucose, UA NEGATIVE  NEGATIVE (mg/dL)   Hgb urine dipstick MODERATE (*) NEGATIVE    Bilirubin Urine NEGATIVE  NEGATIVE    Ketones, ur NEGATIVE  NEGATIVE (mg/dL)   Protein, ur NEGATIVE  NEGATIVE (mg/dL)   Urobilinogen, UA 0.2  0.0 - 1.0 (mg/dL)   Nitrite NEGATIVE  NEGATIVE    Leukocytes, UA NEGATIVE  NEGATIVE   URINE MICROSCOPIC-ADD ON     Status: Abnormal   Collection Time   11/21/11  9:03 PM      Component Value Range   Squamous Epithelial / LPF FEW (*) RARE    RBC / HPF 3-6  <3 (RBC/hpf)   Bacteria, UA RARE  RARE   GLUCOSE, CAPILLARY     Status: Abnormal   Collection Time   11/21/11  9:33 PM      Component Value Range   Glucose-Capillary 200 (*) 70 - 99 (mg/dL)  CARDIAC PANEL(CRET KIN+CKTOT+MB+TROPI)     Status: Normal   Collection Time   11/21/11 11:06 PM      Component Value Range   Total CK 79  7 - 177 (U/L)   CK, MB 1.6  0.3 - 4.0 (ng/mL)   Troponin I <0.30   <0.30 (ng/mL)   Relative Index RELATIVE INDEX IS INVALID  0.0 - 2.5   GLUCOSE, CAPILLARY     Status: Abnormal   Collection Time   11/22/11  7:15 AM      Component Value Range   Glucose-Capillary 142 (*) 70 - 99 (mg/dL)   Comment 1 Notify RN    LIPID PANEL     Status: Normal   Collection Time   11/22/11  9:21 AM      Component Value Range   Cholesterol 143  0 - 200 (mg/dL)   Triglycerides 35  <161 (mg/dL)   HDL 94  >09 (mg/dL)   Total CHOL/HDL Ratio 1.5     VLDL 7  0 - 40 (mg/dL)   LDL Cholesterol 42  0 - 99 (mg/dL)  CARDIAC PANEL(CRET KIN+CKTOT+MB+TROPI)     Status: Normal   Collection Time   11/22/11  9:21 AM      Component Value Range   Total CK 92  7 - 177 (U/L)   CK, MB 2.1  0.3 - 4.0 (ng/mL)   Troponin I <0.30  <0.30 (ng/mL)   Relative Index RELATIVE INDEX IS INVALID  0.0 - 2.5   GLUCOSE, CAPILLARY     Status: Abnormal   Collection Time   11/22/11 11:09 AM      Component Value Range   Glucose-Capillary 116 (*) 70 - 99 (mg/dL)   Comment 1 Notify RN      Micro Results: Recent Results (from the past 240 hour(s))  FECAL OCCULT BLOOD, IMMUNOCHEMICAL  Status: Normal   Collection Time   11/17/11  2:44 PM      Component Value Range Status Comment   Fecal Occult Bld Negative  Negative  Final     Medications:  Scheduled Meds:   . ipratropium  0.5 mg Nebulization Q4H   And  . albuterol  2.5 mg Nebulization Q4H  . aspirin EC  81 mg Oral Daily  . calcium carbonate  3 tablet Oral Daily  . cefTRIAXone (ROCEPHIN)  IV  1 g Intravenous Q24H  . diltiazem  240 mg Oral Daily  . docusate sodium  100 mg Oral BID  . enoxaparin  40 mg Subcutaneous Q24H  . insulin aspart  0-15 Units Subcutaneous TID WC  . methylPREDNISolone (SOLU-MEDROL) injection  80 mg Intravenous BID  . mulitivitamin with minerals  1 tablet Oral Daily  . pantoprazole  40 mg Oral Daily  . rOPINIRole  1 mg Oral Daily  . DISCONTD: sodium chloride   Intravenous STAT   Continuous Infusions:   . 0.9 % NaCl with  KCl 20 mEq / L 100 mL/hr at 11/22/11 0434   PRN Meds:.acetaminophen, acetaminophen, guaiFENesin, morphine, nitroGLYCERIN, ondansetron (ZOFRAN) IV, ondansetron, senna-docusate, DISCONTD: fentaNYL  Assessment/Plan: 1.  Chest Pain  - ruled out for myocardial ischemia by serial cardiac enzymes  2. COPD with acute exacerbation - continue steroids, antibiotics and scheduled neb treatments  3.  DM type 2 - BS are controlled, stable currently  4. Hypertension - monitoring closely  5. Chronic Cough - most likely due to COPD, treating as above.     LOS: 1 day   Tomara Youngberg 11/22/2011, 12:50 PM   Cleora Fleet, MD, CDE, FAAFP [facility] [place], [place]  [phone]

## 2011-11-22 NOTE — Progress Notes (Signed)
Utilization review complete 

## 2011-11-23 DIAGNOSIS — J441 Chronic obstructive pulmonary disease with (acute) exacerbation: Secondary | ICD-10-CM | POA: Diagnosis present

## 2011-11-23 LAB — GLUCOSE, CAPILLARY: Glucose-Capillary: 141 mg/dL — ABNORMAL HIGH (ref 70–99)

## 2011-11-23 LAB — BASIC METABOLIC PANEL
GFR calc non Af Amer: 82 mL/min — ABNORMAL LOW (ref >90)
Glucose, Bld: 168 mg/dL — ABNORMAL HIGH (ref 70–99)
Potassium: 3.7 mEq/L (ref 3.5–5.1)
Sodium: 134 mEq/L — ABNORMAL LOW (ref 135–145)

## 2011-11-23 MED ORDER — ALBUTEROL SULFATE HFA 108 (90 BASE) MCG/ACT IN AERS: 2.0000 | INHALATION_SPRAY | RESPIRATORY_TRACT | Status: DC | PRN

## 2011-11-23 MED ORDER — MOXIFLOXACIN HCL 400 MG PO TABS: 400.0000 mg | ORAL_TABLET | Freq: Every day | ORAL | Status: AC

## 2011-11-23 MED ORDER — PREDNISONE 20 MG PO TABS: 20.0000 mg | ORAL_TABLET | Freq: Every day | ORAL | Status: AC

## 2011-11-23 MED ORDER — HYDROCHLOROTHIAZIDE 12.5 MG PO TABS: 12.5000 mg | ORAL_TABLET | Freq: Every day | ORAL | Status: DC

## 2011-11-23 NOTE — Progress Notes (Signed)
Pt discharged by wheelchair to private vehicle home. V/S stable.

## 2011-11-23 NOTE — Discharge Instructions (Signed)
Chronic Obstructive Pulmonary Disease Chronic obstructive pulmonary disease (COPD) is a condition in which airflow from the lungs is restricted. The lungs can never return to normal, but there are measures you can take which will improve them and make you feel better. CAUSES   Smoking.   Exposure to secondhand smoke.   Breathing in irritants (pollution, cigarette smoke, strong smells, aerosol sprays, paint fumes).   History of lung infections.  TREATMENT  Treatment focuses on making you comfortable (supportive care). Your caregiver may prescribe medications (inhaled or pills) to help improve your breathing. HOME CARE INSTRUCTIONS   If you smoke, stop smoking.   Avoid exposure to smoke, chemicals, and fumes that aggravate your breathing.   Take antibiotic medicines as directed by your caregiver.   Avoid medicines that dry up your system and slow down the elimination of secretions (antihistamines and cough syrups). This decreases respiratory capacity and may lead to infections.   Drink enough water and fluids to keep your urine clear or pale yellow. This loosens secretions.   Use humidifiers at home and at your bedside if they do not make breathing difficult.   Receive all protective vaccines your caregiver suggests, especially pneumococcal and influenza.   Use home oxygen as suggested.   Stay active. Exercise and physical activity will help maintain your ability to do things you want to do.   Eat a healthy diet.  SEEK MEDICAL CARE IF:   You develop pus-like mucus (sputum).   Breathing is more labored or exercise becomes difficult to do.   You are running out of the medicine you take for your breathing.  SEEK IMMEDIATE MEDICAL CARE IF:   You have a rapid heart rate.   You have agitation, confusion, tremors, or are in a stupor (family members may need to observe this).   It becomes difficult to breathe.   You develop chest pain.   You have a fever.  MAKE SURE YOU:    Understand these instructions.   Will watch your condition.   Will get help right away if you are not doing well or get worse.  Document Released: 06/09/2005 Document Revised: 08/19/2011 Document Reviewed: 10/30/2010 [facility] Patient Information 2012 [place], [place]. Antibiotic Medication Antibiotics are among the most frequently prescribed medicines. Antibiotics cure illness by assisting our body to injure or kill the bacteria that cause infection. While antibiotics are useful to treat a wide variety of infections they are useless against viruses. Antibiotics cannot cure colds, flu, or other viral infections.  There are many types of antibiotics available. Your caregiver will decide which antibiotic will be useful for an illness. Never take or give someone else's antibiotics or left over medicine. Your caregiver may also take into account:  Allergies.   The cost of the medicine.   Dosing schedules.   Taste.   Common side effects when choosing an antibiotic for an infection.  Ask your caregiver if you have questions about why a certain medicine was chosen. HOME CARE INSTRUCTIONS Read all instructions and labels on medicine bottles carefully. Some antibiotics should be taken on an empty stomach while others should be taken with food. Taking antibiotics incorrectly may reduce how well they work. Some antibiotics need to be kept in the refrigerator. Others should be kept at room temperature. Ask your caregiver or pharmacist if you do not understand how to give the medicine. Be sure to give the amount of medicine your caregiver has prescribed. Even if you feel better and your symptoms improve, bacteria  may still remain alive in the body. Taking all of the medicine will prevent:  The infection from returning and becoming harder to treat.   Complications from partially treated infections.  If there is any medicine left over after you have taken the medicine as your caregiver has  instructed, throw the medicine away. Be sure to tell your caregiver if you:  Are allergic to any medicines.   Are pregnant or intend to become pregnant while using this medicine.   Are breastfeeding.   Are taking any other prescription, non-prescription medicine, or herbal remedies.   Have any other medical conditions or problems you have not already discussed.  If you are taking birth control pills, they may not work while you are on antibiotics. To avoid unwanted pregnancy:  Continue taking your birth control pills as usual.   Use a second form of birth control (such as condoms) while you are taking antibiotic medicine.   When you finish taking the antibiotic medicine, continue using the second form of birth control until you are finished with your current 1 month cycle of birth control pills.  Try not to miss any doses of medicine. If you miss a dose, take it as soon as possible. However, if it is almost time for the next dose and the dosing schedule is:  2 doses a day, take the missed dose and the next dose 5 to 6 hours apart.   3 or more doses a day, take the missed dose and the next dose 2 to 4 hours apart, then go back to the normal schedule.   If you are unable to make up a missed dose, take the next scheduled dose on time and complete the missed dose at the end of the prescribed time for your medicine.  SIDE EFFECTS TO TAKING ANTIBIOTICS Common side effects to antibiotic use include:  Soft stools or diarrhea.   Mild stomach upset.   Sun sensitivity.  SEEK MEDICAL CARE IF:   If you get worse or do not improve within a few days of starting the medicine.   Vomiting develops.   Diaper rash or rash on the genitals appears.   Vaginal itching occurs.   White patches appear on the tongue or in the mouth.   Severe watery diarrhea and abdominal cramps occur.   Signs of an allergy develop (trouble breathing, wheezing, hives, unknown itchy rash appears, swelling of the  lips, face or tongue, fainting, or blisters on the skin or in the mouth). STOP TAKING THE ANTIBIOTIC.  SEEK IMMEDIATE MEDICAL CARE IF:   Urine turns dark or blood colored.   Skin turns yellow.   Easy bruising or bleeding occurs.   Joint pain or muscle aches occur.   Fever returns.   Severe headache occurs.  Document Released: 05/12/2004 Document Revised: 08/19/2011 Document Reviewed: 05/22/2009 [facility] Patient Information 2012 [place], [place]. Prednisone tablets What is this medicine? PREDNISONE (PRED ni sone) is a corticosteroid. It is commonly used to treat inflammation of the skin, joints, lungs, and other organs. Common conditions treated include asthma, allergies, and arthritis. It is also used for other conditions, such as blood disorders and diseases of the adrenal glands. This medicine may be used for other purposes; ask your health care provider or pharmacist if you have questions. What should I tell my health care provider before I take this medicine? They need to know if you have any of these conditions: -Cushing's syndrome -diabetes -glaucoma -heart disease -high blood pressure -infection (especially a  virus infection such as chickenpox, cold sores, or herpes) -kidney disease -liver disease -mental illness -myasthenia gravis -osteoporosis -seizures -stomach or intestine problems -thyroid disease -an unusual or allergic reaction to lactose, prednisone, other medicines, foods, dyes, or preservatives -pregnant or trying to get pregnant -breast-feeding How should I use this medicine? Take this medicine by mouth with a glass of water. Follow the directions on the prescription label. Take this medicine with food. If you are taking this medicine once a day, take it in the morning. Do not take more medicine than you are told to take. Do not suddenly stop taking your medicine because you may develop a severe reaction. Your doctor will tell you how much medicine to take. If  your doctor wants you to stop the medicine, the dose may be slowly lowered over time to avoid any side effects. Talk to your pediatrician regarding the use of this medicine in children. Special care may be needed. Overdosage: If you think you have taken too much of this medicine contact a poison control center or emergency room at once. NOTE: This medicine is only for you. Do not share this medicine with others. What if I miss a dose? If you miss a dose, take it as soon as you can. If it is almost time for your next dose, talk to your doctor or health care professional. You may need to miss a dose or take an extra dose. Do not take double or extra doses without advice. What may interact with this medicine? Do not take this medicine with any of the following medications: -metyrapone -mifepristone This medicine may also interact with the following medications: -aminoglutethimide -amphotericin B -aspirin and aspirin-like medicines -barbiturates -certain medicines for diabetes, like glipizide or glyburide -cholestyramine -cholinesterase inhibitors -cyclosporine -digoxin -diuretics -ephedrine -female hormones, like estrogens and birth control pills -isoniazid -ketoconazole -NSAIDS, medicines for pain and inflammation, like ibuprofen or naproxen -phenytoin -rifampin -toxoids -vaccines -warfarin This list may not describe all possible interactions. Give your health care provider a list of all the medicines, herbs, non-prescription drugs, or dietary supplements you use. Also tell them if you smoke, drink alcohol, or use illegal drugs. Some items may interact with your medicine. What should I watch for while using this medicine? Visit your doctor or health care professional for regular checks on your progress. If you are taking this medicine over a prolonged period, carry an identification card with your name and address, the type and dose of your medicine, and your doctor's name and  address. This medicine may increase your risk of getting an infection. Tell your doctor or health care professional if you are around anyone with measles or chickenpox, or if you develop sores or blisters that do not heal properly. If you are going to have surgery, tell your doctor or health care professional that you have taken this medicine within the last twelve months. Ask your doctor or health care professional about your diet. You may need to lower the amount of salt you eat. This medicine may affect blood sugar levels. If you have diabetes, check with your doctor or health care professional before you change your diet or the dose of your diabetic medicine. What side effects may I notice from receiving this medicine? Side effects that you should report to your doctor or health care professional as soon as possible: -allergic reactions like skin rash, itching or hives, swelling of the face, lips, or tongue -changes in emotions or moods -changes in vision -depressed mood -eye  pain -fever or chills, cough, sore throat, pain or difficulty passing urine -increased thirst -swelling of ankles, feet Side effects that usually do not require medical attention (report to your doctor or health care professional if they continue or are bothersome): -confusion, excitement, restlessness -headache -nausea, vomiting -skin problems, acne, thin and shiny skin -trouble sleeping -weight gain This list may not describe all possible side effects. Call your doctor for medical advice about side effects. You may report side effects to FDA at 1-800-FDA-1088. Where should I keep my medicine? Keep out of the reach of children. Store at room temperature between 15 and 30 degrees C (59 and 86 degrees F). Protect from light. Keep container tightly closed. Throw away any unused medicine after the expiration date. NOTE: This sheet is a summary. It may not cover all possible information. If you have questions about  this medicine, talk to your doctor, pharmacist, or health care provider.   Using Your Inhaler Proper inhaler technique is very important. Good technique assures that the medicine reaches the lungs. Poor technique results in depositing the medicine on the tongue and back of the throat rather than in the airways. STEPS TO FOLLOW IF USING INHALER WITHOUT EXTENSION TUBE: 1. Remove cap from inhaler.  2. Shake inhaler for 5 seconds before each inhalation (breathing in).  3. Position the inhaler so that the top of the canister faces up.  4. Put your index finger on the top of the medication canister. Your thumb supports the bottom of the inhaler.  5. Open your mouth.  6. Hold the inhaler 1 to 2 inches away from your open mouth. This allows the medicine to slow down before the medicine enters the mouth.  7. Exhale (breathe out) normally and as completely as possible.  8. Press the canister down with the index finger to release the medication.  9. At the same time as the canister is pressed, inhale deeply and slowly until the lungs are completely filled. This should take 4 to 6 seconds. Keep your tongue down and out of the way.  10. Hold the medication in your lungs for up to 10 seconds (10 seconds is best). This helps the medicine get into the small airways of your lungs to work better.  11. Breathe out slowly, through pursed lips. Whistling is an example of pursed lips.  12. Wait at least 1 minute between puffs. Continue with the above steps until you have taken the number of puffs your caregiver has ordered.  13. Replace cap on inhaler.  STEPS TO FOLLOW USING AN INHALER WITH AN EXTENSION (SPACER): 1.  Remove cap from inhaler.  2. Shake inhaler for 5 seconds before each inhalation (breathing in).  3. Your caregiver has asked you to use a spacer with your inhaler. A spacer is a plastic tube with a mouthpiece on one end and an opening that connects to the inhaler on the other end. A spacer helps you  take the medicine better.  4. Place the open end of the spacer onto the mouthpiece of the inhaler.  5. Position the inhaler so that the top of the canister faces up and the spacer mouthpiece faces you.  6. Put your index finger on the top of the medication canister. Your thumb supports the bottom of the inhaler and the spacer.  7. Exhale (breathe out) normally and as completely as possible.  8. Immediately after exhaling, place the spacer between your teeth and into your mouth. Close your mouth tightly around the  spacer.  9. Press the canister down with the index finger to release the medication.  10. At the same time as the canister is pressed, inhale deeply and slowly until the lungs are completely filled. This should take 4 to 6 seconds. Keep your tongue down and out of the way.  11. Hold the medication in your lungs for up to 10 seconds (10 seconds is best). This helps the medicine get into the small airways of your lungs to work better. Exhale.  12. Repeat inhaling deeply through the spacer mouthpiece. Again hold that breath for up to 10 seconds (10 seconds is best). Exhale slowly. If it is difficult to take this second deep breath through the spacer, breathe normally several times through the spacer. Remove the spacer from your mouth.  13. Wait at least 1 minute between puffs. Continue with the above steps until you have taken the number of puffs your caregiver has ordered.  14. Remove spacer from the inhaler and place cap on inhaler.  If you are using different kinds of inhalers, use your quick relief medicine to open the airways 10 - 15 minutes before using a steroid. If you are unsure which inhalers to use and the order of using them, ask your caregiver, nurse, or respiratory therapist. If you are using a steroid inhaler, rinse your mouth with water after your last puff and then spit out the water. Do not swallow the water. Avoid the following:  Inhaling before or after starting the spray of  medicine. It takes practice to coordinate your breathing with triggering the spray.   Inhaling through the nose (rather than the mouth) when triggering the spray.  HOW TO DETERMINE IF YOUR INHALER IS FULL OR NEARLY EMPTY:  Determine when an inhaler is empty. You cannot know when an inhaler is empty by shaking it. A few inhalers are now being made with dose counters. Ask your caregiver for a prescription that has a dose counter if you feel you need that extra help.   If your inhaler does not have a counter, check the number of doses in the inhaler before you use it. The canister or box will list the number of doses in the canister. Divide the total number of doses in the canister by the number you will use each day to find how many days the canister will last. (For example, if your canister has 200 doses and you take 2 puffs, 4 times each day, which is 8 puffs a day. Dividing 200 by 8 equals 25. The canister should last 25 days.) Using a calendar, count forward that many days to see when your inhaler will run out. Write the refill date on a calendar or your canister.   Remember, if you need to take extra doses, the inhaler will empty sooner than you figured. Be sure you have a refill before your canister runs out. Refill your inhaler 7 to 10 days before it runs out.  HOME CARE INSTRUCTIONS   Do not use the inhaler more than your caregiver tells you. If you are still wheezing and are feeling tightness in your chest, call your caregiver.   Keep an adequate supply of medication. This includes making sure the medicine is not expired, and you have a spare inhaler.   Follow your caregiver or inhaler insert directions for cleaning the inhaler and spacer.  SEEK MEDICAL CARE IF:   Symptoms are only partially relieved with your inhaler.   You are having trouble using your  inhaler.   You experience some increase in phlegm.    Using Your Inhaler Take the cap off the mouthpiece.  Shake the inhaler for  5 seconds.  Turn the inhaler so the bottle is above the mouthpiece. Hold it away from your mouth, at a distance of the width of 2 fingers.  Open your mouth widely, and tilt your head back slightly. Let your breath out.  Take a deep breath in slowly through your mouth. At the same time, push down on the bottle 1 time. You will feel the medicine enter your mouth and throat as you breathe.  Continue to take a deep breath in very slowly.  After you have breathed in completely, hold your breath for 10 seconds. This will help the medicine to settle in your lungs. If you cannot hold your breath for 10 seconds, hold it for as long as you can before you breathe out.  If your doctor has told you to take more than 1 puff, wait at least 1 minute between puffs. This will help you get the best results from your medicine.  If you use a steroid inhaler, rinse out your mouth after each dose.  Wash your inhaler once a day. Remove the bottle from the mouthpiece. Rinse the mouthpiece and cap with warm water. Dry everything well before you put the inhaler back together.  Document Released: 06/08/2008 Document Revised: 08/19/2011 Document Reviewed: 06/17/2009  [facility] Patient Information 2012 [place], [place].You develop a fever of 100.5 F (38.1 C).  SEEK IMMEDIATE MEDICAL CARE IF:   You feel little or no relief with your inhalers. You are still wheezing and are feeling shortness of breath and/or tightness in your chest.   You have side effects such as dizziness, headaches or fast heart rate.   You have chills, fever, night sweats or an oral temperature above 102 F (38.9 C).   Phlegm production increases a lot, or there is blood in the phlegm.  MAKE SURE YOU:   Understand these instructions.   Will watch your condition.   Will get help right away if you are not doing well or get worse.  Document Released: 08/27/2000 Document Revised: 08/19/2011 Document Reviewed: 06/17/2009 [facility] Patient Information  2012 [place], [place].  2012, [employer]. (04/15/2011 10:57:14 AM)

## 2011-11-23 NOTE — Discharge Summary (Signed)
HOSPITAL DISCHARGE SUMMARY   @n   [name], 78 y.o., DOB 27-Jan-1927, MRN [id number]  Admission date: 11/21/2011 Discharge Date: 11/23/2011  Primary MD Roxy Manns, MD, MD  Admitting Physician Cleora Fleet, MD  Admission Diagnosis  Chest pain [786.5] chest pain  Discharge Diagnoses:    Active Hospital Problems  Diagnoses Date Noted   . COPD exacerbation 11/23/2011   . Chest pain 11/21/2011   . Bronchitis with bronchospasm 11/21/2011   . Hyponatremia 11/21/2011   . Hypokalemia 11/21/2011   . Dermatitis of face 05/12/2011   . Cough 04/15/2011   . Diabetes mellitus type 2, diet-controlled 05/11/2010   . Pure hypercholesterolemia 11/03/2009   . BACK PAIN 10/14/2009   . ALLERGIC RHINITIS 04/22/2008   . OSTEOARTHRITIS 12/15/2006   . GERD 11/29/2006   . HYPERTENSION 11/29/2006     Resolved Hospital Problems  Diagnoses Date Noted Date Resolved    Past Medical History  Diagnosis Date  . HX: breast cancer 12/99  . HTN (hypertension)   . GERD (gastroesophageal reflux disease)   . Hyperglycemia   . Allergic rhinitis   . Incontinence   . Chronic cough   . Compression fracture   . Diverticulosis   . Hemorrhoids   . Personal history of skin cancer     Basal cell  . Osteoporosis     fosamax in past/ followed by Dr Ophelia Charter   . Endometrial cancer   . Uterine cancer   . Arthritis   . Diabetes mellitus     Past Surgical History  Procedure Date  . Total abdominal hysterectomy w/ bilateral salpingoophorectomy     endometrial cancer  . Mastectomy     Left  . Back surgery 2007    Dr. Ophelia Charter -disk  . Skin cancer excision 10/08    basal cell  . Cholecystectomy 2009  . Cataract extraction 7/10  . Dexa 2/00    LS osteoporsis  . Dexa 2/02    same; OP, LS, spine, hip  . Dexa 3/04    BMD spine; low BMD hip  . Colonoscopy 3/02    Diverticulosis; hemorrhoids    Hospital Course See H&P, Labs, Consult and Test reports for all details. In brief, this patient was  admitted for CHEST PAIN, chronic cough who presented to the ER today after she began to experience some uncomfortable chest pain and pressure. She says that symptoms started at rest around 5am. It is described as a heavy sensation located in her left chest and radiating through to her back. She also notices a "choking" sensation in her throat and some radiation into her shoulder. Denies any diaphoresis or dyspnea. She has not had any nausea or vomiting. Denies abdominal pain. Has never had anything like this before and became concerned. Pain radiated into her jaw and left shoulder. No radiation into the back. Pt says that she also has a chronic cough and wheezing that she has been experiencing over the last week. She was found to have an elevated WBC count and was found to have a normal chest xray. Hospital admission was requested for further evaluation and management.     The patient was admitted to telemetry and ruled out for MI by serial cardiac enzymes.  The patient was found to have more resp symptoms with productive cough and wheezing,  She was treated for an exacerbation of COPD and her symptoms started to improve.  She was given IV steroids and given nebs around the clock.  She was  started on rocephin IV.  She responded well.  She will be sent home on several more days of prednisone, antibiotics, albuterol inhaler and close follow up.  Pt's HCTZ dose was decreased due to hypokalemia (K was replaced) and Pt has close follow up arranged with her PCP Dr. Milinda Antis.  I told the patient that if she continued to have cough she should see a lung specialist immediately by requesting from her PCP.  The patient verbalized understanding. The patient had a leukocytosis, thought to be related to her lung infection and having been on IV steroids.  She will need to have her CBC rechecked in 4 days when she follows up with her PCP.      Active Hospital Problems  Diagnoses Date Noted   . COPD exacerbation 11/23/2011   .  Chest pain - resolved 11/21/2011   . Bronchitis with bronchospasm 11/21/2011   . Hyponatremia 11/21/2011   . Hypokalemia - resolved 11/21/2011   . Dermatitis of face 05/12/2011   . Cough 04/15/2011   . Diabetes mellitus type 2, diet-controlled 05/11/2010   . Pure hypercholesterolemia 11/03/2009   . BACK PAIN 10/14/2009   . ALLERGIC RHINITIS 04/22/2008   . OSTEOARTHRITIS 12/15/2006   . GERD 11/29/2006   . HYPERTENSION 11/29/2006     Resolved Hospital Problems  Diagnoses Date Noted Date Resolved     Today's Assessment:   Subjective:   M Carfagno  Pt is awake, alert, walking in room, no distress, says she slept well.   Objective:   Blood pressure 148/79, pulse 101, temperature 98.6 F (37 C), temperature source Oral, resp. rate 18, height 5' (1.524 m), weight 74.1 kg (163 lb 5.8 oz), SpO2 95.00%. No intake or output data in the 24 hours ending 11/23/11 0839  Exam Gen - awake, alert, NAD Lungs - BBS better air movement, lungs still tight but improving, no crackles CV - normal s1, s2 sounds Abd - soft, +BS, nondistended, no masses Ext - no cyanosis Neuro - no focal deficits   Lab Results  Component Value Date   WBC 23.0* (on IV prednisone) 11/22/2011   WBC 6.1 11/17/2007   HGB 12.9 11/22/2011   HGB 13.1 11/17/2007   HCT 36.9 11/22/2011   HCT 37.5 11/17/2007   PLT 263 11/22/2011   PLT 280 11/17/2007   LYMPHOPCT 14.8 11/01/2011   LYMPHOPCT 25.2 11/17/2007   MONOPCT 12.4* 11/01/2011   MONOPCT 13.4* 11/17/2007   EOSPCT 1.8 11/01/2011   EOSPCT 1.2 11/17/2007   BASOPCT 0.5 11/01/2011   BASOPCT 0.1 11/17/2007   CMP:  Lab Results  Component Value Date   NA 134* 11/23/2011   K 3.7 11/23/2011   CL 100 11/23/2011   CO2 22 11/23/2011   BUN 18 11/23/2011   CREATININE 0.61 11/23/2011   PROT 8.0 11/21/2011   ALBUMIN 4.1 11/21/2011   BILITOT 0.6 11/21/2011   ALKPHOS 71 11/21/2011   AST 64* 11/21/2011   ALT 51* 11/21/2011  .  Discharge Instructions    Check BS 3 times per day until you follow up with  PCP Get CBC rechecked with PCP in 4 days at follow up Get a referral for a lung specialist from your PCP.  Return if symptoms recur, worsen or new problems develop.  No heavy physical activity until you follow up with PCP.    DISCHARGE MEDICATION: Medication List  As of 11/23/2011  8:39 AM   TAKE these medications         acetaminophen 650 MG  CR tablet   Commonly known as: TYLENOL   Take 1,300 mg by mouth 2 (two) times daily.      albuterol 108 (90 BASE) MCG/ACT inhaler   Commonly known as: PROVENTIL HFA;VENTOLIN HFA   Inhale 2 puffs into the lungs every 4 (four) hours as needed for wheezing or shortness of breath (coughing).      aspirin EC 81 MG tablet   Take 81 mg by mouth daily.      calcium carbonate 500 MG chewable tablet   Commonly known as: TUMS - dosed in mg elemental calcium   Chew 3 tablets by mouth daily.      diltiazem 240 MG 24 hr capsule   Commonly known as: CARDIZEM CD   Take 240 mg by mouth daily.      docusate sodium 100 MG capsule   Commonly known as: COLACE   Take 100 mg by mouth 2 (two) times daily.      hydrochlorothiazide 12.5 MG tablet   Commonly known as: HYDRODIURIL   Take 1 tablet (12.5 mg total) by mouth daily.      hydrocortisone valerate cream 0.2 %   Commonly known as: WESTCORT   Apply 1 application topically daily as needed. To face.        moxifloxacin 400 MG tablet   Commonly known as: AVELOX   Take 1 tablet (400 mg total) by mouth daily.      mulitivitamin with minerals Tabs   Take 1 tablet by mouth daily.      omeprazole 40 MG capsule   Commonly known as: PRILOSEC   Take 40 mg by mouth daily.      predniSONE 20 MG tablet   Commonly known as: DELTASONE   Take 1 tablet (20 mg total) by mouth daily.      rOPINIRole 1 MG tablet   Commonly known as: REQUIP   Take 1 mg by mouth daily.      vitamin D (CHOLECALCIFEROL) 400 UNITS tablet   Take 800 Units by mouth daily.            Disposition and Follow-up:  Discharge  Orders    Future Appointments: Provider: Department: Dept Phone: Center:   04/21/2012 8:45 AM [facility] [place] [phone] [id number]   04/28/2012 3:15 PM Judy Pimple, MD [facility] [phone] [id number]     Follow-up Information    Follow up with Roxy Manns, MD in 3 days.          The risks, benefits, and possible side effects of all treatments and tests were explained to the patient.  The patient verbalized understanding.  The importance of close follow up with the primary care medical provider was explained clearly to the patient.  The patient verbalized understanding.  The patient was given instructions to return if symptoms recur, worsen or new changes develop.  The patient verbalized understanding.   Cleora Fleet, MD, CDE, FAAFP [facility] [place], [place]   Total Time spent reviewing critical document, reviewing this patient's comprehensive hospitalization, arranging follow up and coordination of care, reviewing data and todays exam greater than 35 minutes.   SignedStandley Dakins 11/23/2011 8:39 AM

## 2011-11-24 ENCOUNTER — Telehealth: Payer: Self-pay | Admitting: Family Medicine

## 2011-11-24 MED ORDER — PROMETHAZINE HCL 12.5 MG PO TABS: 12.5000 mg | ORAL_TABLET | Freq: Three times a day (TID) | ORAL | Status: AC | PRN

## 2011-11-24 NOTE — Telephone Encounter (Signed)
Patient notified as instructed by telephone. Medication phoned to [facility] pharmacy as instructed. Asher Muir please open acute appt on Dr Lucretia Roers schedule for Friday 11/26/11 and call pt with appt info. (if pt has not heard from [facility] by noon tomorrow pt will call back to get appt.)

## 2011-11-24 NOTE — Telephone Encounter (Signed)
Pt is is needing a hospital f/u in 3 days she got out of the hospital yesterday ( 11/23/11). Pt was diagnosed with COPD. You do not have anything available until Monday 11/29/11. Pt is also needing nausea Medication ???

## 2011-11-24 NOTE — Telephone Encounter (Signed)
Will do px for phenergan for nausea- watch out for sedation Px written for call in   Please put her in somewhere Friday to see me -can open an acute appt  thanks

## 2011-11-24 NOTE — Telephone Encounter (Signed)
Patient notified as instructed by telephone. Pt said she feels about the same as when discharged from the hospital.Pt was not nauseated in the hospital but since discharge 11/23/11 pt is nauseated on and off.Please advise.

## 2011-11-24 NOTE — Telephone Encounter (Signed)
I read her discharge summary and think that the 18th would be ok based on how she was doing at discharge - however is she feeling worse or more sob?  Also is she nauseated from med? Was she nauseated in hospital?

## 2011-11-25 NOTE — Telephone Encounter (Signed)
Spoke with pt and scheduled her for Friday 11/26/11

## 2011-11-26 ENCOUNTER — Encounter: Payer: Self-pay | Admitting: Family Medicine

## 2011-11-26 ENCOUNTER — Ambulatory Visit (INDEPENDENT_AMBULATORY_CARE_PROVIDER_SITE_OTHER): Payer: Medicare Other | Admitting: Family Medicine

## 2011-11-26 VITALS — BP 150/74 | HR 100 | Temp 97.9°F | Ht 60.0 in | Wt 166.0 lb

## 2011-11-26 DIAGNOSIS — J209 Acute bronchitis, unspecified: Secondary | ICD-10-CM

## 2011-11-26 DIAGNOSIS — J441 Chronic obstructive pulmonary disease with (acute) exacerbation: Secondary | ICD-10-CM

## 2011-11-26 DIAGNOSIS — E871 Hypo-osmolality and hyponatremia: Secondary | ICD-10-CM

## 2011-11-26 NOTE — Assessment & Plan Note (Signed)
hctz was cut to 12.5 Will see how she does with that and bp F/u 4-6 wk

## 2011-11-26 NOTE — Progress Notes (Signed)
Subjective:    Patient ID: [name], female    DOB: 03-10-1927, 78 y.o.   MRN: [id number]  HPI Was in hosp earlier this week with exac of copd / bronchitis  Had inc wbc R/o for MI  Was d/c on proventil and avelox and prednisone Overall feels much bbetter   Is a little nauseated - called in phenergan and has not used yet (not bad)  Possibly from medicines   Still coughing a bit - less than she was Some wheeze-needs help with inhaler Fever is better   They cut her hctz to 12.5 due to low na bp is up a bit  No symptoms   Patient Active Problem List  Diagnoses  . Pure hypercholesterolemia  . RESTLESS LEG SYNDROME  . HYPERTENSION  . ALLERGIC RHINITIS  . GERD  . CHOLELITHIASIS  . MICROSCOPIC HEMATURIA  . KERATOSIS, SEBORRHEIC NEC  . OSTEOARTHRITIS  . BACK PAIN  . OSTEOPOROSIS  . URINARY INCONTINENCE, MIXED  . Diabetes mellitus type 2, diet-controlled  . BREAST CANCER, HX OF  . Cough  . Dermatitis of face  . Obesity  . Chest pain  . Bronchitis with bronchospasm  . Hyponatremia  . Hypokalemia  . COPD exacerbation   Past Medical History  Diagnosis Date  . HX: breast cancer 12/99  . HTN (hypertension)   . GERD (gastroesophageal reflux disease)   . Hyperglycemia   . Allergic rhinitis   . Incontinence   . Chronic cough   . Compression fracture   . Diverticulosis   . Hemorrhoids   . Personal history of skin cancer     Basal cell  . Osteoporosis     fosamax in past/ followed by Dr Ophelia Charter   . Endometrial cancer   . Uterine cancer   . Arthritis   . Diabetes mellitus    Past Surgical History  Procedure Date  . Total abdominal hysterectomy w/ bilateral salpingoophorectomy     endometrial cancer  . Mastectomy     Left  . Back surgery 2007    Dr. Ophelia Charter -disk  . Skin cancer excision 10/08    basal cell  . Cholecystectomy 2009  . Cataract extraction 7/10  . Dexa 2/00    LS osteoporsis  . Dexa 2/02    same; OP, LS, spine, hip  . Dexa 3/04    BMD spine;  low BMD hip  . Colonoscopy 3/02    Diverticulosis; hemorrhoids   History  Substance Use Topics  . Smoking status: Never Smoker   . Smokeless tobacco: Not on file  . Alcohol Use: No   Family History  Problem Relation Age of Onset  . Colon cancer Brother    Allergies  Allergen Reactions  . Clarithromycin Nausea Only  . Codeine Nausea Only  . ION:[id number] Acid+Aspartame Nausea Only  . Sulfonamide Derivatives Nausea Only  . Tetracycline Nausea Only   Current Outpatient Prescriptions on File Prior to Visit  Medication Sig Dispense Refill  . acetaminophen (TYLENOL) 650 MG CR tablet Take 1,300 mg by mouth 2 (two) times daily.        Marland Kitchen albuterol (PROVENTIL HFA;VENTOLIN HFA) 108 (90 BASE) MCG/ACT inhaler Inhale 2 puffs into the lungs every 4 (four) hours as needed for wheezing or shortness of breath (coughing).  1 Inhaler  2  . aspirin EC 81 MG tablet Take 81 mg by mouth daily.      . calcium carbonate (TUMS - DOSED IN MG ELEMENTAL CALCIUM) 500 MG chewable tablet  Chew 3 tablets by mouth daily.       Marland Kitchen diltiazem (CARDIZEM CD) 240 MG 24 hr capsule Take 240 mg by mouth daily.      Marland Kitchen docusate sodium (COLACE) 100 MG capsule Take 100 mg by mouth 2 (two) times daily.        . hydrocortisone valerate cream (WESTCORT) 0.2 % Apply 1 application topically daily as needed. To face.       . moxifloxacin (AVELOX) 400 MG tablet Take 1 tablet (400 mg total) by mouth daily.  5 tablet  0  . Multiple Vitamin (MULITIVITAMIN WITH MINERALS) TABS Take 1 tablet by mouth daily.      Marland Kitchen omeprazole (PRILOSEC) 40 MG capsule Take 40 mg by mouth daily.      . predniSONE (DELTASONE) 20 MG tablet Take 1 tablet (20 mg total) by mouth daily.  5 tablet  0  . rOPINIRole (REQUIP) 1 MG tablet Take 1 mg by mouth daily.      . vitamin D, CHOLECALCIFEROL, 400 UNITS tablet Take 800 Units by mouth daily.        . hydrochlorothiazide (HYDRODIURIL) 12.5 MG tablet Take 1 tablet (12.5 mg total) by mouth daily.   30 tablet  0  . promethazine (PHENERGAN) 12.5 MG tablet Take 1 tablet (12.5 mg total) by mouth every 8 (eight) hours as needed for nausea.  20 tablet  0         Review of Systems Review of Systems  Constitutional: Negative for fever, appetite change, fatigue and unexpected weight change.  Eyes: Negative for pain and visual disturbance.  Respiratory: Negative for sob or wheeze/ cough is improving  Cardiovascular: Negative for cp or palpitations    Gastrointestinal: Negative for, diarrhea and constipation. and abd pain or vomiting  Genitourinary: Negative for urgency and frequency.  Skin: Negative for pallor or rash   Neurological: Negative for weakness, light-headedness, numbness and headaches.  Hematological: Negative for adenopathy. Does not bruise/bleed easily.  Psychiatric/Behavioral: Negative for dysphoric mood. The patient is not nervous/anxious.          Objective:   Physical Exam  Constitutional: She appears well-developed and well-nourished. No distress.  HENT:  Head: Normocephalic and atraumatic.  Nose: Nose normal.  Mouth/Throat: No oropharyngeal exudate.       Nares are boggy  Eyes: Conjunctivae and EOM are normal. Pupils are equal, round, and reactive to light. Right eye exhibits no discharge. Left eye exhibits no discharge.  Neck: Normal range of motion. Neck supple. No JVD present. Carotid bruit is not present. No thyromegaly present.  Cardiovascular: Normal rate, regular rhythm and normal heart sounds.   Pulmonary/Chest: Effort normal and breath sounds normal. No respiratory distress. She has no wheezes. She has no rales. She exhibits no tenderness.       Harsh bs throughout  Few scattered rhonchi Good air exch  Abdominal: Soft. Bowel sounds are normal. She exhibits no distension and no mass. There is no tenderness.  Musculoskeletal: She exhibits no edema.  Lymphadenopathy:    She has no cervical adenopathy.  Neurological: She is alert. She has normal  reflexes. No cranial nerve deficit. She exhibits normal muscle tone. Coordination normal.  Skin: Skin is warm and dry. No rash noted. No erythema. No pallor.  Psychiatric: She has a normal mood and affect.          Assessment & Plan:

## 2011-11-26 NOTE — Assessment & Plan Note (Addendum)
Improved after hosp stay (rev records/ labs/ studies in detail) Finishing prednisone and abx (avelox) and using albuterol prn  F/u 4-6 wk Update if not starting to improve in a week or if worsening

## 2011-11-26 NOTE — Assessment & Plan Note (Signed)
Past 2nd smoke exp mild copd inst in use of rescue inhaler today F/u 4-6 wk  Is finishing pred for exac from illness

## 2011-11-26 NOTE — Patient Instructions (Signed)
Finish your medicines  Use inhaler when needed  Stay on the lower dose of hctz  If you get worse let me know Use the nausea medicine if you need it  Follow up with me in 4-6 weeks

## 2011-11-29 ENCOUNTER — Ambulatory Visit: Payer: Medicare Other | Admitting: Family Medicine

## 2011-12-03 ENCOUNTER — Ambulatory Visit (INDEPENDENT_AMBULATORY_CARE_PROVIDER_SITE_OTHER): Payer: Medicare Other | Admitting: Family Medicine

## 2011-12-03 ENCOUNTER — Telehealth: Payer: Self-pay | Admitting: Family Medicine

## 2011-12-03 ENCOUNTER — Encounter: Payer: Self-pay | Admitting: Family Medicine

## 2011-12-03 ENCOUNTER — Ambulatory Visit (INDEPENDENT_AMBULATORY_CARE_PROVIDER_SITE_OTHER)
Admission: RE | Admit: 2011-12-03 | Discharge: 2011-12-03 | Disposition: A | Discharge status: Home / Self Care | Payer: Medicare Other | Source: Ambulatory Visit | Attending: Family Medicine | Admitting: Family Medicine

## 2011-12-03 VITALS — BP 140/50 | HR 84 | Temp 98.0°F | Wt 164.0 lb

## 2011-12-03 DIAGNOSIS — R079 Chest pain, unspecified: Secondary | ICD-10-CM

## 2011-12-03 DIAGNOSIS — R0781 Pleurodynia: Secondary | ICD-10-CM

## 2011-12-03 MED ORDER — TRAMADOL HCL 50 MG PO TABS: 50.0000 mg | ORAL_TABLET | Freq: Three times a day (TID) | ORAL | Status: AC | PRN

## 2011-12-03 NOTE — Patient Instructions (Signed)
Nice to meet you. You have likely fractured a rib. Please take tramadol and Tylenol as directed. We will call you with your xray results.

## 2011-12-03 NOTE — Telephone Encounter (Signed)
Caller: Rexann/Patient; PCP: Roxy Manns A.; CB#: [phone]; Call regarding L Side Pain; L sided abd pain "for several days" that worsened this AM. Side pain increases with movement. Pt was coughing quite a bit on 12/02/11. Emergent sx r/o. Appt is requested after triage. Appt sched for 1245 today with Dr. Dayton Martes.

## 2011-12-03 NOTE — Progress Notes (Signed)
Subjective:    Patient ID: [name], female    DOB: 1926-11-28, 78 y.o.   MRN: [id number]  HPI 78 yo pt of Dr. Milinda Antis new to me with complicated medical history here for left side pain. L sided abd pain "for several days" that worsened this AM after she coughed. Side pain increases with movement and deep breaths.  No SOB but taking shallow breaths due to pain.  Notes reviewed.  Was in hosp earlier this month with exac of copd / bronchitis. Finished course of avelox and prednisone Overall feels much bbetter   Afebrile. No fevers. Patient Active Problem List  Diagnoses  . Pure hypercholesterolemia  . RESTLESS LEG SYNDROME  . HYPERTENSION  . ALLERGIC RHINITIS  . GERD  . CHOLELITHIASIS  . MICROSCOPIC HEMATURIA  . KERATOSIS, SEBORRHEIC NEC  . OSTEOARTHRITIS  . BACK PAIN  . OSTEOPOROSIS  . URINARY INCONTINENCE, MIXED  . Diabetes mellitus type 2, diet-controlled  . BREAST CANCER, HX OF  . Cough  . Dermatitis of face  . Obesity  . Chest pain  . Bronchitis with bronchospasm  . Hyponatremia  . Hypokalemia  . COPD exacerbation   Past Medical History  Diagnosis Date  . HX: breast cancer 12/99  . HTN (hypertension)   . GERD (gastroesophageal reflux disease)   . Hyperglycemia   . Allergic rhinitis   . Incontinence   . Chronic cough   . Compression fracture   . Diverticulosis   . Hemorrhoids   . Personal history of skin cancer     Basal cell  . Osteoporosis     fosamax in past/ followed by Dr Ophelia Charter   . Endometrial cancer   . Uterine cancer   . Arthritis   . Diabetes mellitus    Past Surgical History  Procedure Date  . Total abdominal hysterectomy w/ bilateral salpingoophorectomy     endometrial cancer  . Mastectomy     Left  . Back surgery 2007    Dr. Ophelia Charter -disk  . Skin cancer excision 10/08    basal cell  . Cholecystectomy 2009  . Cataract extraction 7/10  . Dexa 2/00    LS osteoporsis  . Dexa 2/02    same; OP, LS, spine, hip  . Dexa 3/04    BMD  spine; low BMD hip  . Colonoscopy 3/02    Diverticulosis; hemorrhoids   History  Substance Use Topics  . Smoking status: Never Smoker   . Smokeless tobacco: Not on file  . Alcohol Use: No   Family History  Problem Relation Age of Onset  . Colon cancer Brother    Allergies  Allergen Reactions  . Clarithromycin Nausea Only  . Codeine Nausea Only  . WUJ:[id number] Acid+Aspartame Nausea Only  . Sulfonamide Derivatives Nausea Only  . Tetracycline Nausea Only   Current Outpatient Prescriptions on File Prior to Visit  Medication Sig Dispense Refill  . acetaminophen (TYLENOL) 650 MG CR tablet Take 1,300 mg by mouth 2 (two) times daily.        Marland Kitchen albuterol (PROVENTIL HFA;VENTOLIN HFA) 108 (90 BASE) MCG/ACT inhaler Inhale 2 puffs into the lungs every 4 (four) hours as needed for wheezing or shortness of breath (coughing).  1 Inhaler  2  . aspirin EC 81 MG tablet Take 81 mg by mouth daily.      . calcium carbonate (TUMS - DOSED IN MG ELEMENTAL CALCIUM) 500 MG chewable tablet Chew 3 tablets by mouth daily.       Marland Kitchen  diltiazem (CARDIZEM CD) 240 MG 24 hr capsule Take 240 mg by mouth daily.      Marland Kitchen docusate sodium (COLACE) 100 MG capsule Take 100 mg by mouth 2 (two) times daily.        . hydrochlorothiazide (HYDRODIURIL) 12.5 MG tablet Take 1 tablet (12.5 mg total) by mouth daily.  30 tablet  0  . hydrocortisone valerate cream (WESTCORT) 0.2 % Apply 1 application topically daily as needed. To face.       . moxifloxacin (AVELOX) 400 MG tablet Take 1 tablet (400 mg total) by mouth daily.  5 tablet  0  . Multiple Vitamin (MULITIVITAMIN WITH MINERALS) TABS Take 1 tablet by mouth daily.      Marland Kitchen omeprazole (PRILOSEC) 40 MG capsule Take 40 mg by mouth daily.      Marland Kitchen rOPINIRole (REQUIP) 1 MG tablet Take 1 mg by mouth daily.      . vitamin D, CHOLECALCIFEROL, 400 UNITS tablet Take 800 Units by mouth daily.               Review of Systems  See HPI Constitutional: Negative for  fever, appetite change, fatigue and unexpected weight change.  Eyes: Negative for pain and visual disturbance.  Respiratory: Negative for sob or wheeze Cardiovascular: Negative for cp or palpitations          Objective:   Physical Exam  BP 140/50  Pulse 84  Temp(Src) 98 F (36.7 C) (Oral)  Wt 164 lb (74.39 kg)  Constitutional: She appears well-developed and well-nourished. No distress.  HENT:  Head: Normocephalic and atraumatic.  Nose: Nose normal.   Eyes: Conjunctivae and EOM are normal. Pupils are equal, round, and reactive to light. Right eye exhibits no discharge. Left eye exhibits no discharge.  Neck: Normal range of motion. Neck supple. No JVD present. Carotid bruit is not present. No thyromegaly present.  Cardiovascular: Normal rate, regular rhythm and normal heart sounds.   Pulmonary/Chest:very TTP over left inferior rib cage, takes shallow breaths due to pain, no wheezes or crackles but difficult to assess due to pain. Abdominal: Soft. Bowel sounds are normal. She exhibits no distension and no mass. There is no tenderness.  Musculoskeletal: She exhibits no edema.  Lymphadenopathy:    She has no cervical adenopathy.  Neurological: She is alert. She has normal reflexes. No cranial nerve deficit. She exhibits normal muscle tone. Coordination normal.  Skin: Skin is warm and dry. No rash noted. No erythema. No pallor.  Psychiatric: She has a normal mood and affect.      Assessment & Plan:   1. Rib pain  traMADol (ULTRAM) 50 MG tablet, DG Chest 2 View   New with probable rib fracture. Codeine causes nausea (not true allergy). Will send in Rx for Tramadol as needed, continue Tylenol. CXR today although discussed that rib fractures often not apparent on xray.  Xray ordered to assess lungs since she is unable to take deep breaths due to pain. The patient indicates understanding of these issues and agrees with the plan.

## 2011-12-04 ENCOUNTER — Other Ambulatory Visit: Payer: Self-pay | Admitting: Family Medicine

## 2011-12-04 ENCOUNTER — Ambulatory Visit (HOSPITAL_COMMUNITY)
Admission: RE | Admit: 2011-12-04 | Discharge: 2011-12-04 | Disposition: A | Discharge status: Home / Self Care | Payer: Medicare Other | Source: Ambulatory Visit | Attending: Family Medicine | Admitting: Family Medicine

## 2011-12-04 ENCOUNTER — Encounter: Payer: Self-pay | Admitting: Family Medicine

## 2011-12-04 ENCOUNTER — Ambulatory Visit (INDEPENDENT_AMBULATORY_CARE_PROVIDER_SITE_OTHER): Payer: Medicare Other | Admitting: Family Medicine

## 2011-12-04 VITALS — BP 140/68 | Temp 98.5°F | Wt 163.0 lb

## 2011-12-04 DIAGNOSIS — R109 Unspecified abdominal pain: Secondary | ICD-10-CM

## 2011-12-04 DIAGNOSIS — Z9010 Acquired absence of unspecified breast and nipple: Secondary | ICD-10-CM | POA: Insufficient documentation

## 2011-12-04 DIAGNOSIS — R059 Cough, unspecified: Secondary | ICD-10-CM | POA: Insufficient documentation

## 2011-12-04 DIAGNOSIS — R0781 Pleurodynia: Secondary | ICD-10-CM

## 2011-12-04 DIAGNOSIS — R079 Chest pain, unspecified: Secondary | ICD-10-CM

## 2011-12-04 DIAGNOSIS — J90 Pleural effusion, not elsewhere classified: Secondary | ICD-10-CM | POA: Insufficient documentation

## 2011-12-04 DIAGNOSIS — R05 Cough: Secondary | ICD-10-CM | POA: Insufficient documentation

## 2011-12-04 LAB — POCT URINALYSIS DIPSTICK
Nitrite, UA: NEGATIVE
Spec Grav, UA: 1.01
Urobilinogen, UA: 0.2

## 2011-12-04 MED ORDER — LEVOFLOXACIN 500 MG PO TABS: 500.0000 mg | ORAL_TABLET | Freq: Every day | ORAL | Status: AC

## 2011-12-04 MED ORDER — KETOROLAC TROMETHAMINE 30 MG/ML IM SOLN: 30.0000 mg | Freq: Once | INTRAMUSCULAR | Status: DC

## 2011-12-04 MED ORDER — KETOROLAC TROMETHAMINE 30 MG/ML IJ SOLN
30.0000 mg | Freq: Once | INTRAMUSCULAR | Status: AC
  Administered 2011-12-04: 30 mg via INTRAMUSCULAR

## 2011-12-04 MED ORDER — HYDROCODONE-ACETAMINOPHEN 5-500 MG PO TABS: ORAL_TABLET | ORAL | Status: DC

## 2011-12-04 NOTE — Progress Notes (Signed)
OFFICE NOTE  12/04/2011  CC:  Chief Complaint  Patient presents with  . left side rib pain    X 2 days     HPI: Patient is a 78 y.o. Caucasian female who is here for pain. Left sided rib pain, severe x 2d, milder a few days prior, hurts to take a deep breath--she is splinting a lot. No fever.  Vomited x 2 this am, wonders if it was the tramadol (last taken last night and it helped).  No rash at the area, no trauma recalled.  Only occasional cough, +hx of COPD.  Currently no nausea. No diarrhea.  Denies SOB.  Denies hematuria, dysuria, groin or flank pain, or urinary urgency/frequency. Was at PMD yesterday and CXR was done and it showed no rib fx.   Pertinent PMH:  COPD Osteoporosis DM 2, diet controlled Hx of breast cancer, s/p left mastectomy and axillary node dissection Hx of back pain/osteoarthritis Hyperlipidemia GERD  Past Surgical History  Procedure Date  . Total abdominal hysterectomy w/ bilateral salpingoophorectomy     endometrial cancer  . Mastectomy     Left  . Back surgery 2007    Dr. Ophelia Charter -disk  . Skin cancer excision 10/08    basal cell  . Cholecystectomy 2009  . Cataract extraction 7/10  . Dexa 2/00    LS osteoporsis  . Dexa 2/02    same; OP, LS, spine, hip  . Dexa 3/04    BMD spine; low BMD hip  . Colonoscopy 3/02    Diverticulosis; hemorrhoids    MEDS:  Outpatient Prescriptions Prior to Visit  Medication Sig Dispense Refill  . traMADol (ULTRAM) 50 MG tablet Take 1 tablet (50 mg total) by mouth every 8 (eight) hours as needed for pain.  30 tablet  0  . acetaminophen (TYLENOL) 650 MG CR tablet Take 1,300 mg by mouth 2 (two) times daily.        Marland Kitchen albuterol (PROVENTIL HFA;VENTOLIN HFA) 108 (90 BASE) MCG/ACT inhaler Inhale 2 puffs into the lungs every 4 (four) hours as needed for wheezing or shortness of breath (coughing).  1 Inhaler  2  . aspirin EC 81 MG tablet Take 81 mg by mouth daily.      . calcium carbonate (TUMS - DOSED IN MG ELEMENTAL  CALCIUM) 500 MG chewable tablet Chew 3 tablets by mouth daily.       Marland Kitchen diltiazem (CARDIZEM CD) 240 MG 24 hr capsule Take 240 mg by mouth daily.      Marland Kitchen docusate sodium (COLACE) 100 MG capsule Take 100 mg by mouth 2 (two) times daily.        . hydrochlorothiazide (HYDRODIURIL) 12.5 MG tablet Take 1 tablet (12.5 mg total) by mouth daily.  30 tablet  0  . hydrocortisone valerate cream (WESTCORT) 0.2 % Apply 1 application topically daily as needed. To face.       . Multiple Vitamin (MULITIVITAMIN WITH MINERALS) TABS Take 1 tablet by mouth daily.      Marland Kitchen omeprazole (PRILOSEC) 40 MG capsule Take 40 mg by mouth daily.      Marland Kitchen rOPINIRole (REQUIP) 1 MG tablet Take 1 mg by mouth daily.      . vitamin D, CHOLECALCIFEROL, 400 UNITS tablet Take 800 Units by mouth daily.          PE: Blood pressure 140/68, temperature 98.5 F (36.9 C), temperature source Oral, weight 163 lb (73.936 kg). Gen: Alert, tired appearing but in NAD, intermittently grabs left rib cage  and grimaces when she takes a deep breath.  Patient is oriented to person, place, time, and situation.   ENT: Ears: EACs clear, normal epithelium.  TMs with good light reflex and landmarks bilaterally.  Eyes: no injection, icteris, swelling, or exudate.  EOMI, PERRLA. Nose: no drainage or turbinate edema/swelling.  No injection or focal lesion.  Mouth: lips without lesion/swelling.  Oral mucosa pink and moist.  Dentition intact and without obvious caries or gingival swelling.  Oropharynx without erythema, exudate, or swelling.  Neck - No masses or thyromegaly or limitation in range of motion CV: RRR LUNGS: CTA bilat, nonlabored, aeration seems equal but she splints markedly so auscultation of left anterior/anxilllary region difficult ABD: soft, NT EXT: no clubbing, cyanosis, or edema.   LAB today: CC UA (POC)--3+ hemolyzed blood, 2+ protein, otherwise neg/normal  IMPRESSION AND PLAN:  Rib pain, severe, subacute. Reviewed CXR from yesterday.   Decided to get dedicated rib view to look closer for rib fracture. Discussed with pt use of tramadol likely just fine, but I did rx hydrocodone/APAP 5/500 1-2 q6h prn if she feels like tramadol consistently causing GI upset. Will send urine for urinalysis in lab, with reflex micro and also do culture on this specimen. Doubt UTI.  Doubt kidney stone.   FOLLOW UP: next week a PMD    ADDENDUM 8: 15 PM 12/04/11:  CXR showed new left lower lobe atelectasis with effusion. Splinting and hypoventilation in this area has possibly led to pneumonia.  Discussed with pt and started levaquin 500 mg qd x 10.  Asked pt to f/u in about a week with Dr. Milinda Antis and told her she'll likely need f/u CXR to document resolution in approx 6 wks.

## 2011-12-05 ENCOUNTER — Other Ambulatory Visit: Payer: Self-pay | Admitting: Family Medicine

## 2011-12-05 LAB — URINALYSIS, ROUTINE W REFLEX MICROSCOPIC
Bilirubin Urine: NEGATIVE
Glucose, UA: NEGATIVE mg/dL
Leukocytes, UA: NEGATIVE
Protein, ur: NEGATIVE mg/dL

## 2011-12-05 LAB — URINALYSIS, MICROSCOPIC ONLY

## 2011-12-06 ENCOUNTER — Telehealth: Payer: Self-pay | Admitting: Family Medicine

## 2011-12-06 LAB — URINE CULTURE
Colony Count: NO GROWTH
Organism ID, Bacteria: NO GROWTH

## 2011-12-06 MED ORDER — ONDANSETRON HCL 4 MG PO TABS: 4.0000 mg | ORAL_TABLET | Freq: Three times a day (TID) | ORAL | Status: AC | PRN

## 2011-12-06 NOTE — Telephone Encounter (Signed)
[facility] Triage Call Report Triage Record Num: [id number] Operator: Baldomero Lamy Patient Name: [name] Call Date & Time: 12/06/2011 5:17:39PM Patient Phone: [phone] PCP: Audrie Gallus. Tower Patient Gender: Female PCP Fax : Patient DOB: 03-Nov-1926 Practice Name: [facility] Reason for Call: Caller: [name]/Patient is calling with a question about Medication for Shot?.The medication was written by Judy Pimple Pt calling regarding Tramadon Injectable sent to pharmacy on 3/23. Pt does not want. States pain med is sufficient. Protocol(s) Used: Information Only Call; No Symptom Triage (Adult) Recommended Outcome per Protocol: Provide Information or Advice Only Reason for Outcome: Follow-up call to recent contact; no triage required. Information provided from past call documentation, approved references or experience. Care Advice: ~ 03/

## 2011-12-06 NOTE — Telephone Encounter (Signed)
Patient advised as instructed via telephone, no vomiting only nausea.  She will call to make f/u appt or with an update.

## 2011-12-06 NOTE — Telephone Encounter (Signed)
Caller: Syd/Patient is calling with a question about Medication for Shot?.The medication was written by Judy Pimple Pt calling regarding Tramadol Injectable sent to pharmacy on 3/23. Pt does not want. States pain med is sufficient.

## 2011-12-06 NOTE — Telephone Encounter (Signed)
Spoke with patient and she stated that she has already spoken with the pharmacy and cancelled the Rx.

## 2011-12-06 NOTE — Telephone Encounter (Signed)
Caller: Temisha/PT; PCP: Roxy Manns A.; CB#: [phone]; ; ; Call regarding Cough/Congestion;  Pt seen in office on 3/22 and 3/23 and trx for nausea, cough/congestion and possible broken rib from coughing. Pt no better but no worse. PT REQUESTING A CHANGE FROM PHENERGAN TO GENERIC ZOFRAN--DOES NOT FEEL LIKE PHENERGAN IS WORKING. Pt Pharmacy [facility] [address]. PT best # above.

## 2011-12-06 NOTE — Telephone Encounter (Signed)
Can try zofran (let me know if vomiting) If no further improvement this week will need f/u Sent zofran electronically

## 2011-12-06 NOTE — Telephone Encounter (Signed)
Just tell her to call pharmacy and cancel the px she does not want Keep me updated

## 2011-12-06 NOTE — Telephone Encounter (Signed)
[facility] Triage Call Report Triage Record Num: [id number] Operator: Chevis Pretty Patient Name: [name] Call Date & Time: 12/04/2011 7:27:21AM Patient Phone: [phone] PCP: Audrie Gallus. Tower Patient Gender: Female PCP Fax : Patient DOB: [birth year] Practice Name: [facility] Reason for Call: Caller: Anastasija/Patient; PCP: Roxy Manns A.; CB#: [phone]; Call regarding Medication Issue vomiting; given tramadol for fx rib pain in office 12/03/11; states she has been vomiting after taking the dose. Last dose 2215 12/03/11 but vomiting onset 0600 12/04/11. Episode x 1. No diarrhea. Patient insists that it is her pain medication. Feels well otherwise. No current c/o nausea or abdominal pain. Per protocol, emergent symptoms denied; advised appt wihtin 24 hours. Appt sched 1115 12/04/11 in [place] office. Protocol(s) Used: Nausea or Vomiting Recommended Outcome per Protocol: Call Provider within 24 Hours Reason for Outcome: Symptoms began after starting or changing dose of prescription, nonprescription, alternative medication, or illicit drug Care Advice: ~ Avoid drinking alcoholic or caffeinated beverages. ~ Speak with provider before next dose of medication is due. ~ SYMPTOM / CONDITION MANAGEMENT ~ CAUTIONS Nausea Care Advice: - Drink small amounts of clear, sweetened liquids or ice cold drinks. - Eat light, bland foods such as saltine crackers or plain bread. - Do not eat high fat, highly seasoned, high fiber, or high sugar content foods. - Avoid mixing hot food and cold foods. - Eat smaller, more frequent meals. - Rest as much as possible in a sitting or in a propped lying position. Do not lie flat for at least 2 hours after eating. - Do not take pain medication (such as aspirin, NSAIDs) while nauseated. - Rest as much as possible until symptoms improve since activity may worsen nausea. ~ Do not use nonprescription laxative or pain relievers like aspirin or other  non-steroidal anti-inflammatory drugs. Consult your provider regarding continuing prescription medications. ~ Medication Advice: - Discontinue all nonprescription and alternative medications, especially stimulants, until evaluated by provider. - Take prescribed medications as directed, following label instructions for the medication. - Do not change medications or dosing regimen until provider is consulted. - Know possible side effects of medication and what to do if they occur. - Tell provider all prescription, nonprescription or alternative medications that you take ~ Vomiting Care Advice: - Do not eat solid foods until vomiting subsides. - Begin taking fluids by sucking on ice chips or popsicles or taking sips of cool clear, nonprescription oral rehydration solution). - Gradually drink larger amounts of these fluids so that you are drinking six to eight 8 oz. (.2 liter) of fluids a day. - Keep activity to a minimum. - After vomiting subsides, eat smaller, more frequent meals of easily digested foods such as crackers, toast, bananas, rice, cooked cereal, applesauce, broth, baked or mashed potatoes, chicken or [place] without skin. Eat slowly. - Take fluids 30 minutes before or 60 minutes after meals. ~ 12/04/2011 7:36:51AM Page 1 of 2 [id number] [facility] Triage Call Report Patient Name: [name] continuation page/s - Avoid high fat, highly seasoned, high fiber or high sugar content foods. - Avoid extremely hot or cold foods. - Do not take pain medication (such as aspirin, NSAIDs) while nauseated or vomiting. - Consult your provider for advice regarding continuing prescription medication. - Rest as much as possible in a sitting or in a propped lying position. Do not lie flat for at least 2 hours after eating. 03/23/

## 2011-12-07 NOTE — Progress Notes (Signed)
I rev note- being tx for poss pneumonia with levaquin She has upcoming appt with me

## 2011-12-15 ENCOUNTER — Ambulatory Visit (INDEPENDENT_AMBULATORY_CARE_PROVIDER_SITE_OTHER): Payer: Medicare Other | Admitting: Family Medicine

## 2011-12-15 ENCOUNTER — Encounter: Payer: Self-pay | Admitting: Family Medicine

## 2011-12-15 VITALS — BP 122/60 | HR 93 | Temp 98.2°F | Ht 60.0 in | Wt 169.2 lb

## 2011-12-15 DIAGNOSIS — R079 Chest pain, unspecified: Secondary | ICD-10-CM

## 2011-12-15 DIAGNOSIS — I10 Essential (primary) hypertension: Secondary | ICD-10-CM

## 2011-12-15 DIAGNOSIS — J209 Acute bronchitis, unspecified: Secondary | ICD-10-CM

## 2011-12-15 MED ORDER — BENZONATATE 200 MG PO CAPS: 200.0000 mg | ORAL_CAPSULE | Freq: Three times a day (TID) | ORAL | Status: AC | PRN

## 2011-12-15 NOTE — Patient Instructions (Signed)
Try tessalon pills for cough  If that does not work- try the tussionex you have at home  If worse cough or wheezing or not improving in a week, let me know  Cancel the 4/19 appt with me and re schedule for 4-6 weeks

## 2011-12-15 NOTE — Assessment & Plan Note (Signed)
Overall much better (after 2 rounds of quinolone) except for persistant cough Will try tessalon 200 If not helpful -- has tussionex at home to use with caution F/u 4-6wk for visit and cxr -- to examine L atelectasis

## 2011-12-15 NOTE — Assessment & Plan Note (Signed)
Fine on reduced hctz A little edema- is tolerating that

## 2011-12-15 NOTE — Assessment & Plan Note (Signed)
L sided chest wall pain is much improved - off pain med now  Suspect costochondritis and pleurisy Rev cxr  Will re check at f/u

## 2011-12-15 NOTE — Progress Notes (Signed)
Subjective:    Patient ID: [name], female    DOB: 09-Jun-1927, 78 y.o.   MRN: [id number]  HPI Here for f/u of bronchitis with bronchospasm/ chest wall pain and HTN  Last visit had come out of hospital with bronchitis and was doing better In interim got worse again with cwp and splinting  Saw cardiol- Dr Marvel Plan Had cxr/ rib films-- saw atelectasis in L --- thought could not r/o infiltrate and extended her quinolone abx Also changed pain med to vicodin -- from ultram because it was making her nauseated  Until today nausea was a bit better == still wonders if nausea is from abx -- and she is finishing that today Took a nausea pill  Still coughing a lot -- is dry and hacking for the most part  Wheeze some - but not as much as when she was in the hospital  Pain is overall improved -- has not needed pain med in several days   Remained on dec dose of hctz at 12.5 since hosp due to hyponatremia bp is good at 122/60 today  Patient Active Problem List  Diagnoses  . Pure hypercholesterolemia  . RESTLESS LEG SYNDROME  . HYPERTENSION  . ALLERGIC RHINITIS  . GERD  . CHOLELITHIASIS  . MICROSCOPIC HEMATURIA  . KERATOSIS, SEBORRHEIC NEC  . OSTEOARTHRITIS  . BACK PAIN  . OSTEOPOROSIS  . URINARY INCONTINENCE, MIXED  . Diabetes mellitus type 2, diet-controlled  . BREAST CANCER, HX OF  . Cough  . Dermatitis of face  . Obesity  . Chest pain  . Bronchitis with bronchospasm  . Hyponatremia  . Hypokalemia  . COPD exacerbation  . Rib pain   Past Medical History  Diagnosis Date  . HX: breast cancer 12/99  . HTN (hypertension)   . GERD (gastroesophageal reflux disease)   . Hyperglycemia   . Allergic rhinitis   . Incontinence   . Chronic cough   . Compression fracture   . Diverticulosis   . Hemorrhoids   . Personal history of skin cancer     Basal cell  . Osteoporosis     fosamax in past/ followed by Dr Ophelia Charter   . Endometrial cancer   . Uterine cancer   . Arthritis   .  Diabetes mellitus    Past Surgical History  Procedure Date  . Total abdominal hysterectomy w/ bilateral salpingoophorectomy     endometrial cancer  . Mastectomy     Left  . Back surgery 2007    Dr. Ophelia Charter -disk  . Skin cancer excision 10/08    basal cell  . Cholecystectomy 2009  . Cataract extraction 7/10  . Dexa 2/00    LS osteoporsis  . Dexa 2/02    same; OP, LS, spine, hip  . Dexa 3/04    BMD spine; low BMD hip  . Colonoscopy 3/02    Diverticulosis; hemorrhoids   History  Substance Use Topics  . Smoking status: Never Smoker   . Smokeless tobacco: Not on file  . Alcohol Use: No   Family History  Problem Relation Age of Onset  . Colon cancer Brother    Allergies  Allergen Reactions  . Clarithromycin Nausea Only  . Codeine Nausea Only  . [id number]:[id number] Acid+Aspartame Nausea Only  . Sulfonamide Derivatives Nausea Only  . Tetracycline Nausea Only   Current Outpatient Prescriptions on File Prior to Visit  Medication Sig Dispense Refill  . acetaminophen (TYLENOL) 650 MG CR tablet Take 1,300 mg by mouth 2 (  two) times daily.        Marland Kitchen albuterol (PROVENTIL HFA;VENTOLIN HFA) 108 (90 BASE) MCG/ACT inhaler Inhale 2 puffs into the lungs every 4 (four) hours as needed for wheezing or shortness of breath (coughing).  1 Inhaler  2  . aspirin EC 81 MG tablet Take 81 mg by mouth daily.      . calcium carbonate (TUMS - DOSED IN MG ELEMENTAL CALCIUM) 500 MG chewable tablet Chew 3 tablets by mouth daily.       Marland Kitchen diltiazem (CARDIZEM CD) 240 MG 24 hr capsule Take 240 mg by mouth daily.      Marland Kitchen docusate sodium (COLACE) 100 MG capsule Take 100 mg by mouth 2 (two) times daily.        . hydrocortisone valerate cream (WESTCORT) 0.2 % Apply 1 application topically daily as needed. To face.       . Multiple Vitamin (MULITIVITAMIN WITH MINERALS) TABS Take 1 tablet by mouth daily.      Marland Kitchen omeprazole (PRILOSEC) 40 MG capsule Take 40 mg by mouth daily.      Marland Kitchen rOPINIRole  (REQUIP) 1 MG tablet Take 1 mg by mouth daily.      . vitamin D, CHOLECALCIFEROL, 400 UNITS tablet Take 800 Units by mouth daily.        . hydrochlorothiazide (HYDRODIURIL) 12.5 MG tablet Take 1 tablet (12.5 mg total) by mouth daily.  30 tablet  0       Review of Systems Review of Systems  Constitutional: Negative for fever, appetite change, fatigue and unexpected weight change.  Eyes: Negative for pain and visual disturbance.  Respiratory: Negative for sob or wheeze, pos for cough    Cardiovascular: Negative for cp or palpitations  (pos for rib pain that is improved )  Gastrointestinal: Negative for nausea, diarrhea and constipation.  Genitourinary: Negative for urgency and frequency.  Skin: Negative for pallor or rash   Neurological: Negative for weakness, light-headedness, numbness and headaches.  Hematological: Negative for adenopathy. Does not bruise/bleed easily.  Psychiatric/Behavioral: Negative for dysphoric mood. The patient is not nervous/anxious.          Objective:   Physical Exam  Constitutional: She appears well-developed and well-nourished. No distress.       Elderly female- well appearing with dry persistent cough  HENT:  Head: Normocephalic and atraumatic.  Right Ear: External ear normal.  Left Ear: External ear normal.  Nose: Nose normal.  Mouth/Throat: Oropharynx is clear and moist.       Nares are boggy  Eyes: Conjunctivae and EOM are normal. Pupils are equal, round, and reactive to light. No scleral icterus.  Neck: Normal range of motion. Neck supple. No JVD present. No thyromegaly present.  Cardiovascular: Normal rate, regular rhythm and normal heart sounds.  Exam reveals no gallop.   Pulmonary/Chest: Effort normal and breath sounds normal. No respiratory distress. She has no wheezes. She has no rales. She exhibits tenderness.       Some L lateral rib tenderness- no crepitus or skin changes Overall improved   Abdominal: Soft. Bowel sounds are normal. She  exhibits no distension. There is no tenderness.  Musculoskeletal: She exhibits edema.       Trace pedal edema   Lymphadenopathy:    She has no cervical adenopathy.  Neurological: She is alert. She exhibits normal muscle tone. Coordination normal.  Skin: Skin is warm and dry. No rash noted. No erythema. No pallor.  Psychiatric: She has a normal mood and affect.  Assessment & Plan:

## 2011-12-20 ENCOUNTER — Telehealth: Payer: Self-pay | Admitting: Family Medicine

## 2011-12-20 DIAGNOSIS — R05 Cough: Secondary | ICD-10-CM

## 2011-12-20 DIAGNOSIS — J209 Acute bronchitis, unspecified: Secondary | ICD-10-CM

## 2011-12-20 NOTE — Telephone Encounter (Signed)
I made ref to pulm Adv her to try tussionex for cough with caution- it is more powerful, and please let me know if / when she needs a refil thanks

## 2011-12-20 NOTE — Telephone Encounter (Signed)
Patient advised as instructed via telephone, she agrees to see Pulmonologist.  She has taken some Tussionex but not a lot.

## 2011-12-20 NOTE — Telephone Encounter (Signed)
Caller: [name]/Patient; PCP: Roxy Manns A.; CB#: [phone]; Call regarding Coughing and some Nausea - mostly in the mornings and has no energy for past 3 weeks. Afebrile. Coughing to the point of gagging and sometimes loose- productive for whitish mucos. Feet slightly swollen and she is short of breath with activites ; Last seen in the office on 12/15/11- taking med for nausea and Tussi Pearls for cough. Care advice per Cough Protocol and appnt advised within 3 days- scheduled for 12/22/11 @1215  with Dr. Milinda Antis.

## 2011-12-20 NOTE — Telephone Encounter (Signed)
She has had such a rough time with this - as discussed at the last visit, I want to go ahead and refer her to a lung specialist/ pulmonary   (let me know if agreeable)  Also - when we talked before - she had some tussionex left over at home - has she tried this yet for cough ?  Let me know, thanks

## 2011-12-21 NOTE — Telephone Encounter (Signed)
Patient advised as instructed via telephone. 

## 2011-12-22 ENCOUNTER — Encounter: Payer: Self-pay | Admitting: Family Medicine

## 2011-12-22 ENCOUNTER — Ambulatory Visit (INDEPENDENT_AMBULATORY_CARE_PROVIDER_SITE_OTHER): Payer: Medicare Other | Admitting: Family Medicine

## 2011-12-22 VITALS — BP 122/70 | HR 94 | Temp 97.9°F | Ht 60.0 in | Wt 169.8 lb

## 2011-12-22 DIAGNOSIS — R600 Localized edema: Secondary | ICD-10-CM

## 2011-12-22 DIAGNOSIS — R0609 Other forms of dyspnea: Secondary | ICD-10-CM

## 2011-12-22 DIAGNOSIS — R05 Cough: Secondary | ICD-10-CM

## 2011-12-22 DIAGNOSIS — R0600 Dyspnea, unspecified: Secondary | ICD-10-CM

## 2011-12-22 DIAGNOSIS — R609 Edema, unspecified: Secondary | ICD-10-CM

## 2011-12-22 DIAGNOSIS — E538 Deficiency of other specified B group vitamins: Secondary | ICD-10-CM | POA: Insufficient documentation

## 2011-12-22 DIAGNOSIS — K219 Gastro-esophageal reflux disease without esophagitis: Secondary | ICD-10-CM

## 2011-12-22 DIAGNOSIS — R5383 Other fatigue: Secondary | ICD-10-CM

## 2011-12-22 LAB — COMPREHENSIVE METABOLIC PANEL
ALT: 18 U/L (ref 0–35)
CO2: 29 mEq/L (ref 19–32)
Calcium: 9 mg/dL (ref 8.4–10.5)
Chloride: 93 mEq/L — ABNORMAL LOW (ref 96–112)
GFR: 79.71 mL/min (ref >60.00)
Potassium: 4.4 mEq/L (ref 3.5–5.1)
Sodium: 133 mEq/L — ABNORMAL LOW (ref 135–145)
Total Protein: 7.5 g/dL (ref 6.0–8.3)

## 2011-12-22 LAB — CBC WITH DIFFERENTIAL/PLATELET
Basophils Absolute: 0 10*3/uL (ref 0.0–0.1)
Lymphocytes Relative: 8.6 % — ABNORMAL LOW (ref 12.0–46.0)
Monocytes Relative: 13.2 % — ABNORMAL HIGH (ref 3.0–12.0)
Platelets: 555 10*3/uL — ABNORMAL HIGH (ref 150.0–400.0)
RDW: 14.5 % (ref 11.5–14.6)

## 2011-12-22 MED ORDER — SPIRONOLACTONE 25 MG PO TABS: 25.0000 mg | ORAL_TABLET | Freq: Every day | ORAL | Status: DC

## 2011-12-22 NOTE — Patient Instructions (Signed)
Stop the hctz (current fluid pill)  Replace it with the aldactone for swelling -one pill daily  Labs today See dr Shelle Iron on Monday as planned  Schedule next labs for 2 weeks please  Update me if symptoms worsen

## 2011-12-22 NOTE — Progress Notes (Signed)
Subjective:    Patient ID: [name], female    DOB: 1927/01/05, 78 y.o.   MRN: [id number]  HPI Here for continued cough and sob (pulse ox 96%)  Cough is not any worse- just very persistent (in fact a bit better today)- worse if talking or sitting  Not coughing much phlegm up -- no color/ is clear  Clearing throat a lot while here Some post nasal drip -- nasal mucous - usually clear / occ a little color  Headache is not common  Throat is not sore  Ears feel ok   Pt denies throat clearing but is clearing her throat constantly during this visit  Also just not getting energy level back  No fever   Gets short of breath when she is going up stairs or rushing   Chest is no longer sore   Tessalon - ? If very helpful  Tried the tussionex several days ago     Her ankles and feet are getting more swollen with time since cutting hctz to 12.5  This is starting to hurt and burn  Did better on 25 of the med , but this altered her electolytes  Is interested in trying a different fluid pill  Patient Active Problem List  Diagnoses  . Pure hypercholesterolemia  . RESTLESS LEG SYNDROME  . HYPERTENSION  . ALLERGIC RHINITIS  . GERD  . CHOLELITHIASIS  . MICROSCOPIC HEMATURIA  . KERATOSIS, SEBORRHEIC NEC  . OSTEOARTHRITIS  . BACK PAIN  . OSTEOPOROSIS  . URINARY INCONTINENCE, MIXED  . Diabetes mellitus type 2, diet-controlled  . BREAST CANCER, HX OF  . Cough  . Dermatitis of face  . Obesity  . Chest pain  . Bronchitis with bronchospasm  . Hyponatremia  . Hypokalemia  . COPD exacerbation  . Rib pain  . Fatigue  . Pedal edema  . Dyspnea  . B12 deficiency   Past Medical History  Diagnosis Date  . HX: breast cancer 12/99  . HTN (hypertension)   . GERD (gastroesophageal reflux disease)   . Hyperglycemia   . Allergic rhinitis   . Incontinence   . Chronic cough   . Compression fracture   . Diverticulosis   . Hemorrhoids   . Personal history of skin cancer     Basal  cell  . Osteoporosis     fosamax in past/ followed by Dr Ophelia Charter   . Endometrial cancer   . Uterine cancer   . Arthritis   . Diabetes mellitus    Past Surgical History  Procedure Date  . Total abdominal hysterectomy w/ bilateral salpingoophorectomy     endometrial cancer  . Mastectomy     Left  . Back surgery 2007    Dr. Ophelia Charter -disk  . Skin cancer excision 10/08    basal cell  . Cholecystectomy 2009  . Cataract extraction 7/10  . Dexa 2/00    LS osteoporsis  . Dexa 2/02    same; OP, LS, spine, hip  . Dexa 3/04    BMD spine; low BMD hip  . Colonoscopy 3/02    Diverticulosis; hemorrhoids   History  Substance Use Topics  . Smoking status: Never Smoker   . Smokeless tobacco: Not on file  . Alcohol Use: No   Family History  Problem Relation Age of Onset  . Colon cancer Brother    Allergies  Allergen Reactions  . Clarithromycin Nausea Only  . Codeine Nausea Only  . NWG:[id number]+QIONGEXBM+[id number] Acid+Aspartame Nausea Only  . Sulfonamide  Derivatives Nausea Only  . Tetracycline Nausea Only   Current Outpatient Prescriptions on File Prior to Visit  Medication Sig Dispense Refill  . acetaminophen (TYLENOL) 650 MG CR tablet Take 1,300 mg by mouth 2 (two) times daily.        Marland Kitchen albuterol (PROVENTIL HFA;VENTOLIN HFA) 108 (90 BASE) MCG/ACT inhaler Inhale 2 puffs into the lungs every 4 (four) hours as needed for wheezing or shortness of breath (coughing).  1 Inhaler  2  . aspirin EC 81 MG tablet Take 81 mg by mouth daily.      . benzonatate (TESSALON) 200 MG capsule Take 1 capsule (200 mg total) by mouth 3 (three) times daily as needed for cough (swallow whole ).  20 capsule  0  . calcium carbonate (TUMS - DOSED IN MG ELEMENTAL CALCIUM) 500 MG chewable tablet Chew 3 tablets by mouth daily.       Marland Kitchen diltiazem (CARDIZEM CD) 240 MG 24 hr capsule Take 240 mg by mouth daily.      Marland Kitchen docusate sodium (COLACE) 100 MG capsule Take 100 mg by mouth 2 (two) times daily.        .  hydrocortisone valerate cream (WESTCORT) 0.2 % Apply 1 application topically daily as needed. To face.       . Multiple Vitamin (MULITIVITAMIN WITH MINERALS) TABS Take 1 tablet by mouth daily.      Marland Kitchen omeprazole (PRILOSEC) 40 MG capsule Take 40 mg by mouth daily.      Marland Kitchen rOPINIRole (REQUIP) 1 MG tablet Take 1 mg by mouth daily.      . vitamin D, CHOLECALCIFEROL, 400 UNITS tablet Take 800 Units by mouth daily.        Marland Kitchen spironolactone (ALDACTONE) 25 MG tablet Take 1 tablet (25 mg total) by mouth daily.  30 tablet  11        Review of Systems Review of Systems  Constitutional: Negative for fever, appetite change, and unexpected weight change. pos for fatigue  Eyes: Negative for pain and visual disturbance.  Respiratory: Negative for wheeze  Cardiovascular: Negative for cp or palpitations  No PND or orthopnea , pos for ankle swelling   Gastrointestinal: Negative for nausea, diarrhea and constipation.  Genitourinary: Negative for urgency and frequency.  Skin: Negative for pallor or rash   Neurological: Negative for weakness, light-headedness, numbness and headaches.  Hematological: Negative for adenopathy. Does not bruise/bleed easily.  Psychiatric/Behavioral: Negative for dysphoric mood. The patient is not nervous/anxious.          Objective:   Physical Exam  Constitutional: She appears well-developed and well-nourished. No distress.       Well appearing , overwt elderly female  HENT:  Head: Normocephalic and atraumatic.  Right Ear: External ear normal.  Left Ear: External ear normal.  Nose: Nose normal.  Mouth/Throat: Oropharynx is clear and moist.       Some post nasal drip   Eyes: Conjunctivae and EOM are normal. Pupils are equal, round, and reactive to light. Right eye exhibits no discharge. Left eye exhibits no discharge. No scleral icterus.  Neck: Normal range of motion. Neck supple. No JVD present. Carotid bruit is not present. No thyromegaly present.  Cardiovascular: Normal  rate, regular rhythm, normal heart sounds and intact distal pulses.  Exam reveals no gallop.   Pulmonary/Chest: Effort normal and breath sounds normal. No respiratory distress. She has no wheezes.  Abdominal: Soft. Bowel sounds are normal. She exhibits no distension, no abdominal bruit and no mass. There  is no tenderness.  Musculoskeletal: Normal range of motion. She exhibits edema. She exhibits no tenderness.       One plus edema of ankles/ feet    Lymphadenopathy:    She has no cervical adenopathy.  Neurological: She is alert. She has normal reflexes. No cranial nerve deficit. She exhibits normal muscle tone. Coordination normal.  Skin: Skin is warm and dry. No rash noted. No erythema. No pallor.  Psychiatric: She has a normal mood and affect.          Assessment & Plan:

## 2011-12-23 ENCOUNTER — Telehealth: Payer: Self-pay | Admitting: Family Medicine

## 2011-12-23 ENCOUNTER — Other Ambulatory Visit: Payer: Self-pay | Admitting: Family Medicine

## 2011-12-23 DIAGNOSIS — R0600 Dyspnea, unspecified: Secondary | ICD-10-CM

## 2011-12-23 DIAGNOSIS — R600 Localized edema: Secondary | ICD-10-CM

## 2011-12-23 DIAGNOSIS — D649 Anemia, unspecified: Secondary | ICD-10-CM

## 2011-12-23 DIAGNOSIS — E538 Deficiency of other specified B group vitamins: Secondary | ICD-10-CM

## 2011-12-23 NOTE — Assessment & Plan Note (Signed)
Level today due to fatigue

## 2011-12-23 NOTE — Telephone Encounter (Signed)
Message copied by Judy Pimple on Thu Dec 23, 2011  3:49 PM ------      Message from: Gilmer Mor      Created: Thu Dec 23, 2011 11:41 AM       Patient advised as instructed via telephone, no complaints of blood in stool/dark stool/abdominal pain.  She agrees to see a Development worker, international aid and will wait to hear from [facility] regarding appt.  Lab appt scheduled for 12/24/2011 and she will pick up ifob kit at lab appt.

## 2011-12-23 NOTE — Assessment & Plan Note (Signed)
Since a bad bout of bronchitis  ? If post viral  Labs today

## 2011-12-23 NOTE — Assessment & Plan Note (Signed)
This worsened after cutting hctz from 25 to 12.5 in hosp (due to na and K changes)  Will change to aldactone 25 and re asses (lab in 2 weeks )

## 2011-12-23 NOTE — Telephone Encounter (Signed)
Cardiology ref done

## 2011-12-23 NOTE — Assessment & Plan Note (Signed)
Pt denys any heartburn or GI symptoms - but does clear throat often and has chronic cough

## 2011-12-23 NOTE — Assessment & Plan Note (Signed)
With hx of copd and s/p a bad episode of bronchitis  denys any gerd symptoms-but is clearing throat a lot  lmited help with cough meds Upcoming eval by pumonary

## 2011-12-23 NOTE — Assessment & Plan Note (Signed)
Along with cough  appt with pulm planned Hx of copd  Recent bronchitis  Pulse ox 96 % and not sob in office

## 2011-12-24 ENCOUNTER — Other Ambulatory Visit (INDEPENDENT_AMBULATORY_CARE_PROVIDER_SITE_OTHER): Payer: Medicare Other

## 2011-12-24 ENCOUNTER — Telehealth: Payer: Self-pay

## 2011-12-24 DIAGNOSIS — D649 Anemia, unspecified: Secondary | ICD-10-CM

## 2011-12-24 LAB — FERRITIN: Ferritin: 215.6 ng/mL (ref 10.0–291.0)

## 2011-12-24 LAB — IBC PANEL: Saturation Ratios: 9.2 % — ABNORMAL LOW (ref 20.0–50.0)

## 2011-12-24 NOTE — Telephone Encounter (Signed)
Went to lobby and called pt. She had been to lab for testing and Aurther Loft answered her questions. Pt said she did not need anything else.

## 2011-12-25 LAB — RETICULOCYTES: Retic Ct Pct: 1.2 % (ref 0.4–2.3)

## 2011-12-27 ENCOUNTER — Encounter: Payer: Self-pay | Admitting: Pulmonary Disease

## 2011-12-27 ENCOUNTER — Other Ambulatory Visit: Payer: Self-pay | Admitting: *Deleted

## 2011-12-27 ENCOUNTER — Ambulatory Visit (INDEPENDENT_AMBULATORY_CARE_PROVIDER_SITE_OTHER): Payer: Medicare Other | Admitting: Pulmonary Disease

## 2011-12-27 VITALS — BP 136/80 | HR 97 | Temp 98.0°F | Ht 60.0 in | Wt 166.8 lb

## 2011-12-27 DIAGNOSIS — R059 Cough, unspecified: Secondary | ICD-10-CM

## 2011-12-27 DIAGNOSIS — R05 Cough: Secondary | ICD-10-CM

## 2011-12-27 DIAGNOSIS — J986 Disorders of diaphragm: Secondary | ICD-10-CM

## 2011-12-27 MED ORDER — BENZONATATE 100 MG PO CAPS: 100.0000 mg | ORAL_CAPSULE | Freq: Four times a day (QID) | ORAL | Status: DC | PRN

## 2011-12-27 NOTE — Assessment & Plan Note (Signed)
The patient has a chronic cough that really dates back to at least 1997, and has been of intermittent severity since that time.  She has had a difficult time with the cough over the last one year.  Based on her history and exam, it is most likely the cough is upper airway in origin than lower.  She was treated or laryngopharyngeal reflux in the distant past with documented improvement on Prevacid and Propulsid.  I think we should treat her more aggressively for reflux to see if things improve.  The patient is also having constant throat clearing and hoarseness, and postnasal drip is often a contributing factor.  We'll treat her for this as well.  Finally, she likely has cyclical coughing as well, and I have reviewed the behavioral therapies for treatment.  I have told her that if she does not stop clearing her throat, her cough will never resolve.  Re: the possibility of a pulmonary issue, I would find it very unlikely that she has COPD.  She has never smoked, and she has never had pulmonary function studies for documentation.  Will go ahead and have these scheduled for evaluation.

## 2011-12-27 NOTE — Progress Notes (Signed)
  Subjective:    Patient ID: [name], female    DOB: 11-02-1926, 78 y.o.   MRN: [id number]  HPI The patient is an 78 year old female who I've been asked to see for chronic cough.  I have reviewed her old [place] record, and she has a history of chronic cough dating back to at least 1997 were before.  She was actually referred by otolaryngology to Dr. Marina Goodell of GI, and he notes that her cough was greatly improved with treatment of reflux with Prevacid and Propulsid.  The patient states that her cough has really been an off and on phenomenon.  It has really bothered her over the last one year, and is almost always primarily dry in nature.  It is worsened with increased conversation, and improved if she takes a cough drop.  She has constant throat clearing during my evaluation, and her family member states this is an ongoing process.  She has a tickle/globus sensation in her throat, and also intermittent hoarseness.  She isn't sure if she has postnasal drip, and denies known reflux symptoms.  She has had some dyspnea on exertion at times above her usual baseline.  She was recently in the hospital and told that she has "COPD", but she has never smoked nor has she ever had pulmonary function studies.  Her chest x-ray shows very small lung fields, with a chronically elevated right hemidiaphragm dating back to before 2009.  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  She also has mild interstitial marking accentuation, but this is unchanged from 2009.   Review of Systems  Constitutional: Negative.  Negative for fever and unexpected weight change.  HENT: Negative.  Negative for ear pain, nosebleeds, congestion, sore throat, rhinorrhea, sneezing, trouble swallowing, dental problem, postnasal drip and sinus pressure.   Eyes: Negative.  Negative for redness and itching.  Respiratory: Positive for cough and shortness of breath. Negative for chest tightness and wheezing.   Cardiovascular: Positive for leg swelling. Negative  for palpitations.  Gastrointestinal: Negative.  Negative for nausea and vomiting.  Genitourinary: Negative.  Negative for dysuria.  Musculoskeletal: Negative.  Negative for joint swelling.  Skin: Negative.  Negative for rash.  Neurological: Negative.  Negative for headaches.  Hematological: Negative.  Does not bruise/bleed easily.  Psychiatric/Behavioral: Negative.  Negative for dysphoric mood. The patient is not nervous/anxious.        Objective:   Physical Exam Constitutional:  overweight, no acute distress  HENT:  Nares patent without discharge  Oropharynx without exudate, palate and uvula are normal.  +secretions draining posteriorly  Eyes:  Perrla, eomi, no scleral icterus  Neck:  No JVD, no TMG  Cardiovascular:  Normal rate, regular rhythm, no rubs or gallops.  No murmurs        Intact distal pulses  Pulmonary :  Normal breath sounds, no stridor or respiratory distress   No rales, rhonchi, or wheezing  Abdominal:  Soft, nondistended, bowel sounds present.  No tenderness noted.   Musculoskeletal:  1+ lower extremity edema noted.  Lymph Nodes:  No cervical lymphadenopathy noted  Skin:  No cyanosis noted, +LE varicosities.  Neurologic:  Alert, appropriate, moves all 4 extremities without obvious deficit.         Assessment & Plan:

## 2011-12-27 NOTE — Telephone Encounter (Signed)
Received faxed refill request from pharmacy for Ondansetron 4 mg, take one tablet by mouth every 8 hours as needed for nausea as directed. Medication is not on current medication sheet.  Left message to call the office.

## 2011-12-27 NOTE — Patient Instructions (Signed)
Take chlorpheniramine 8mg  one at bedtime until next visit. Increase your omeprazole to 40mg  in am and pm, and take zantac 150mg  one at bedtime. Do not use albuterol inhaler unless emergency No throat clearing!!  Minimize voice use as much as possible, including no singing or yelling. Keep hard candy in your mouth from arising to bedtime to help with tickle Use tessalon pearls 2 every 6hrs if needed for cough followup with me in 3 weeks, and will check full breathing tests same day to see if you have copd or not.

## 2011-12-28 ENCOUNTER — Other Ambulatory Visit: Payer: Self-pay | Admitting: *Deleted

## 2011-12-28 ENCOUNTER — Telehealth: Payer: Self-pay | Admitting: *Deleted

## 2011-12-28 MED ORDER — ONDANSETRON HCL 4 MG PO TABS: 4.0000 mg | ORAL_TABLET | Freq: Three times a day (TID) | ORAL | Status: DC | PRN

## 2011-12-28 NOTE — Telephone Encounter (Signed)
This was done earlier today.

## 2011-12-28 NOTE — Telephone Encounter (Signed)
Patient request refill on ondansetron 4mg  tab. Patient was given this on 12-06-2011 for nausea ok to refill?

## 2011-12-28 NOTE — Telephone Encounter (Signed)
Rx sent electronically, patient notified via telephone.

## 2011-12-28 NOTE — Telephone Encounter (Signed)
Ok to refil # 30 no ref

## 2011-12-28 NOTE — Telephone Encounter (Signed)
Spoke to patient and was advised that she is having nausea spells first thing in the mornings and has been taking this and the nausea stops. Patient states that she normally only takes one first thing in the morning. Is it okay to refill medication?

## 2011-12-28 NOTE — Telephone Encounter (Signed)
I [id number] to r/s pt appt on 01-20-12 due to MD meeting. Pt needs a PFT as well on same day of appt. If any questions see Tammy D. Carron Curie, CMA

## 2011-12-29 ENCOUNTER — Other Ambulatory Visit: Payer: Medicare Other

## 2011-12-29 ENCOUNTER — Telehealth: Payer: Self-pay

## 2011-12-29 DIAGNOSIS — D649 Anemia, unspecified: Secondary | ICD-10-CM

## 2011-12-29 NOTE — Telephone Encounter (Signed)
This was a duplicate message please see message from 12/28/11 taken by Carron Curie

## 2011-12-29 NOTE — Telephone Encounter (Signed)
LMOM at home number for pt to call back and reschedule appt for PFT and ROV w/ KC set for 01/20/12--he will be out for a meeting at this time. Please reschedule at the patient's convenience.

## 2011-12-30 DIAGNOSIS — Z1211 Encounter for screening for malignant neoplasm of colon: Secondary | ICD-10-CM

## 2011-12-30 LAB — FECAL OCCULT BLOOD, IMMUNOCHEMICAL: Fecal Occult Bld: NEGATIVE

## 2011-12-30 NOTE — Telephone Encounter (Signed)
Reset both PFT & OV w/ KC to 01/21/12.  Pt stated nothing further needed at this time.  [name]

## 2011-12-31 ENCOUNTER — Ambulatory Visit: Payer: Medicare Other | Admitting: Family Medicine

## 2011-12-31 ENCOUNTER — Encounter: Payer: Self-pay | Admitting: *Deleted

## 2012-01-05 ENCOUNTER — Telehealth: Payer: Self-pay | Admitting: Family Medicine

## 2012-01-05 ENCOUNTER — Other Ambulatory Visit (INDEPENDENT_AMBULATORY_CARE_PROVIDER_SITE_OTHER): Payer: Medicare Other

## 2012-01-05 DIAGNOSIS — D649 Anemia, unspecified: Secondary | ICD-10-CM

## 2012-01-05 NOTE — Telephone Encounter (Signed)
Lab order

## 2012-01-17 ENCOUNTER — Ambulatory Visit (INDEPENDENT_AMBULATORY_CARE_PROVIDER_SITE_OTHER): Payer: Medicare Other | Admitting: Cardiovascular Disease

## 2012-01-17 ENCOUNTER — Encounter: Payer: Self-pay | Admitting: Cardiovascular Disease

## 2012-01-17 VITALS — BP 139/78 | HR 100 | Wt 162.0 lb

## 2012-01-17 DIAGNOSIS — R0989 Other specified symptoms and signs involving the circulatory and respiratory systems: Secondary | ICD-10-CM

## 2012-01-17 DIAGNOSIS — R0600 Dyspnea, unspecified: Secondary | ICD-10-CM | POA: Insufficient documentation

## 2012-01-17 NOTE — Assessment & Plan Note (Signed)
Well controlled.  Continue current medications and low sodium Dash type diet.    

## 2012-01-17 NOTE — Assessment & Plan Note (Signed)
Reviewed Dr Shelle Iron note.  He does not think she has COPD.  Ordered PFT;s.  CXR only with atelectasis.  Exertional dyspnea must R/O anginal equivalent in elderly female.  Also check echo to assess RV and LV function R/O pulmonary hypertesnion  Differential also includes mild diastolic dysfuntion with mildly elevated BNP

## 2012-01-17 NOTE — Patient Instructions (Signed)
Your physician recommends that you schedule a follow-up appointment in: AS NEEDED  Your physician recommends that you continue on your current medications as directed. Please refer to the Current Medication list given to you today.   Your physician has requested that you have an echocardiogram. Echocardiography is a painless test that uses sound waves to create images of your heart. It provides your doctor with information about the size and shape of your heart and how well your heart's chambers and valves are working. This procedure takes approximately one hour. There are no restrictions for this procedure. DX DYSPNEA  Your physician has requested that you have a lexiscan myoview. For further information please visit [email or web address]. Please follow instruction sheet, as given. DX DYSPNEA  

## 2012-01-17 NOTE — Assessment & Plan Note (Signed)
Cholesterol is at goal.  Continue current dose of statin and diet Rx.  No myalgias or side effects.  F/U  LFT's in 6 months. Lab Results  Component Value Date   LDLCALC 42 11/22/2011

## 2012-01-17 NOTE — Progress Notes (Signed)
Patient ID: [name], female   DOB: 1927/01/03, 78 y.o.   MRN: [id number] 78 yo referred by Dr Milinda Antis for progressive exertional dyspnea.  CRF HTN and elevated cholesterol. Dyspnea worse over the last 2 years.  Has seen Dr Shelle Iron who does not think she has COPD  PFT;s pending.  Non smoker.  Has had a dry cough.  No history of heart disease.  CXR: 12/03/11 reviewed right basilar atelectasis with no CE BNP: Mildly elevated 4/10 186 with mild anemia ECG: 3/13:  SR rate 106 poor R wave progression  No chest pain.  Dyspnea is always exertional and usually only after a short distance being worse up hill.  Occasional palpitations.  No syncope.  No history of DVT or LE edema  ROS: Denies fever, malais, weight loss, blurry vision, decreased visual acuity, cough, sputum, SOB, hemoptysis, pleuritic pain, palpitaitons, heartburn, abdominal pain, melena, lower extremity edema, claudication, or rash.  All other systems reviewed and negative   General: Affect appropriate Healthy:  appears stated age HEENT: normal Neck supple with no adenopathy JVP normal no bruits no thyromegaly Lungs clear with no wheezing and good diaphragmatic motion Heart:  S1/S2 no murmur,rub, gallop or click PMI normal Abdomen: benighn, BS positve, no tenderness, no AAA no bruit.  No HSM or HJR Distal pulses intact with no bruits No edema Neuro non-focal Skin warm and dry No muscular weakness  Medications Current Outpatient Prescriptions  Medication Sig Dispense Refill  . acetaminophen (TYLENOL) 650 MG CR tablet Take 1,300 mg by mouth 2 (two) times daily.        Marland Kitchen albuterol (PROVENTIL HFA;VENTOLIN HFA) 108 (90 BASE) MCG/ACT inhaler Inhale 2 puffs into the lungs every 4 (four) hours as needed for wheezing or shortness of breath (coughing).  1 Inhaler  2  . aspirin EC 81 MG tablet Take 81 mg by mouth daily.      . benzonatate (TESSALON PERLES) 100 MG capsule Take 1 capsule (100 mg total) by mouth every 6 (six) hours as needed  for cough.  30 capsule  1  . calcium carbonate (TUMS - DOSED IN MG ELEMENTAL CALCIUM) 500 MG chewable tablet Chew 3 tablets by mouth daily.       . Chlorpheniramine Tannate 8 MG TABS Take by mouth.      . diltiazem (CARDIZEM CD) 240 MG 24 hr capsule Take 240 mg by mouth daily.      Marland Kitchen docusate sodium (COLACE) 100 MG capsule Take 100 mg by mouth 2 (two) times daily.        . hydrocortisone valerate cream (WESTCORT) 0.2 % Apply 1 application topically daily as needed. To face.       . Multiple Vitamin (MULITIVITAMIN WITH MINERALS) TABS Take 1 tablet by mouth daily.      Marland Kitchen omeprazole (PRILOSEC) 40 MG capsule Take 40 mg by mouth daily.      . ondansetron (ZOFRAN) 4 MG tablet Take 1 tablet (4 mg total) by mouth every 8 (eight) hours as needed.  30 tablet  0  . ranitidine (ZANTAC) 150 MG tablet Take 150 mg by mouth 2 (two) times daily.      Marland Kitchen rOPINIRole (REQUIP) 1 MG tablet Take 1 mg by mouth daily.      Marland Kitchen spironolactone (ALDACTONE) 25 MG tablet Take 1 tablet (25 mg total) by mouth daily.  30 tablet  11  . vitamin D, CHOLECALCIFEROL, 400 UNITS tablet Take 800 Units by mouth daily.  Allergies Amoxicillin-pot clavulanate; Clarithromycin; Codeine; Sulfonamide derivatives; and Tetracycline  Family History: Family History  Problem Relation Age of Onset  . Colon cancer Brother   . Breast cancer Sister   . Breast cancer Sister     Social History: History   Social History  . Marital Status: Single    Spouse Name: N/A    Number of Children: N/A  . Years of Education: N/A   Occupational History  . RETIRED SECRETARY    Social History Main Topics  . Smoking status: Never Smoker   . Smokeless tobacco: Not on file  . Alcohol Use: No  . Drug Use: No  . Sexually Active: No   Other Topics Concern  . Not on file   Social History Narrative   Goes to [facility] 3 days/week for exercise    Electrocardiogram:  3/13  NSR rate 106  Poor R wave progression  Assessment and Plan

## 2012-01-20 ENCOUNTER — Ambulatory Visit: Payer: Medicare Other | Admitting: Pulmonary Disease

## 2012-01-21 ENCOUNTER — Ambulatory Visit (INDEPENDENT_AMBULATORY_CARE_PROVIDER_SITE_OTHER): Payer: Medicare Other | Admitting: Pulmonary Disease

## 2012-01-21 ENCOUNTER — Ambulatory Visit: Payer: Medicare Other | Admitting: Family Medicine

## 2012-01-21 ENCOUNTER — Encounter: Payer: Self-pay | Admitting: Pulmonary Disease

## 2012-01-21 VITALS — BP 138/72 | HR 98 | Temp 97.5°F | Ht 60.0 in | Wt 160.0 lb

## 2012-01-21 DIAGNOSIS — R0600 Dyspnea, unspecified: Secondary | ICD-10-CM

## 2012-01-21 DIAGNOSIS — R0989 Other specified symptoms and signs involving the circulatory and respiratory systems: Secondary | ICD-10-CM

## 2012-01-21 DIAGNOSIS — J986 Disorders of diaphragm: Secondary | ICD-10-CM

## 2012-01-21 DIAGNOSIS — R05 Cough: Secondary | ICD-10-CM

## 2012-01-21 DIAGNOSIS — R0609 Other forms of dyspnea: Secondary | ICD-10-CM

## 2012-01-21 LAB — PULMONARY FUNCTION TEST

## 2012-01-21 NOTE — Assessment & Plan Note (Signed)
The patient has no airflow obstruction by her recent PFTs, but does have restriction that I suspect is related to her paralyzed hemidiaphragm and her centripetal obesity.  Unfortunately, there is little that we can do for her diaphragm, but I stressed to her the importance of weight loss and conditioning.  I have asked her to stop all inhaled medications

## 2012-01-21 NOTE — Assessment & Plan Note (Signed)
The patient's cough is clearly upper airway in origin, and unfortunately she continues to have hoarseness and chronically clearing her throat.  I have asked her to continue with medications for postnasal drip and reflux, and also stressed to her again the importance of not clearing her throat.  I will send her to otolaryngology to make sure she does not have an upper airway structural issue, and if her cough continues, I think she needs to have a followup GI evaluation.

## 2012-01-21 NOTE — Patient Instructions (Signed)
Stay on omeprazole 40mg  one in am and pm.  Can stop zantac at bedtime. Continue with chlorpheniramine 8mg  at bedtime Continue with no throat clearing, using hard candy during day, limit voice use. Stop all inhalers.  Work on weight loss and conditioning.  Will send to ENT to look at your voice box. followup with me in 3 months to check on progress with your cough.

## 2012-01-21 NOTE — Progress Notes (Signed)
PFT done today. 

## 2012-01-21 NOTE — Progress Notes (Signed)
  Subjective:    Patient ID: [name], female    DOB: Apr 20, 1927, 78 y.o.   MRN: [id number]  HPI The patient comes in today for followup of her known chronic cough, and also to review her PFTs for evaluation of dyspnea on exertion.  She was found to have no airflow obstruction, moderate restriction, and her DLCO maneuver was in adequate.  I have reviewed the study with her in detail, and answered all of her questions.  She has been treated aggressively for reflux and postnasal drip, as well as behavioral therapies.  She feels that her cough is about 30% better from the last visit, but unfortunately continues to clear her throat during my visit with her today.   Review of Systems  Constitutional: Negative.  Negative for fever and unexpected weight change.  HENT: Negative.  Negative for ear pain, nosebleeds, congestion, sore throat, rhinorrhea, sneezing, trouble swallowing, dental problem, postnasal drip and sinus pressure.   Eyes: Negative.  Negative for redness and itching.  Respiratory: Positive for cough and shortness of breath. Negative for chest tightness and wheezing.   Cardiovascular: Negative.  Negative for palpitations and leg swelling.  Gastrointestinal: Negative.  Negative for nausea and vomiting.  Genitourinary: Negative.  Negative for dysuria.  Musculoskeletal: Negative.  Negative for joint swelling.  Skin: Negative.  Negative for rash.  Neurological: Negative.  Negative for headaches.  Hematological: Negative.  Does not bruise/bleed easily.  Psychiatric/Behavioral: Negative.  Negative for dysphoric mood. The patient is not nervous/anxious.        Objective:   Physical Exam Overweight female in no acute distress Nose without purulence or discharge noted Chest fairly clear to auscultation Cardiac exam with regular rate and rhythm Alert and oriented, moves all 4 extremities.       Assessment & Plan:

## 2012-01-28 ENCOUNTER — Other Ambulatory Visit: Payer: Self-pay

## 2012-01-28 ENCOUNTER — Ambulatory Visit (HOSPITAL_COMMUNITY): Payer: Medicare Other | Attending: Internal Medicine

## 2012-01-28 ENCOUNTER — Encounter: Payer: Self-pay | Admitting: Pulmonary Disease

## 2012-01-28 DIAGNOSIS — R0600 Dyspnea, unspecified: Secondary | ICD-10-CM

## 2012-01-28 DIAGNOSIS — R0609 Other forms of dyspnea: Secondary | ICD-10-CM | POA: Insufficient documentation

## 2012-01-28 DIAGNOSIS — R0989 Other specified symptoms and signs involving the circulatory and respiratory systems: Secondary | ICD-10-CM | POA: Insufficient documentation

## 2012-01-28 DIAGNOSIS — I10 Essential (primary) hypertension: Secondary | ICD-10-CM | POA: Insufficient documentation

## 2012-01-28 DIAGNOSIS — I080 Rheumatic disorders of both mitral and aortic valves: Secondary | ICD-10-CM | POA: Insufficient documentation

## 2012-01-28 DIAGNOSIS — I079 Rheumatic tricuspid valve disease, unspecified: Secondary | ICD-10-CM | POA: Insufficient documentation

## 2012-01-31 ENCOUNTER — Ambulatory Visit (HOSPITAL_COMMUNITY): Payer: Medicare Other | Attending: Cardiovascular Disease | Admitting: Radiology

## 2012-01-31 VITALS — BP 153/65 | Ht 60.0 in | Wt 157.0 lb

## 2012-01-31 DIAGNOSIS — R0600 Dyspnea, unspecified: Secondary | ICD-10-CM

## 2012-01-31 DIAGNOSIS — I4510 Unspecified right bundle-branch block: Secondary | ICD-10-CM

## 2012-01-31 DIAGNOSIS — R0989 Other specified symptoms and signs involving the circulatory and respiratory systems: Secondary | ICD-10-CM | POA: Insufficient documentation

## 2012-01-31 DIAGNOSIS — R0602 Shortness of breath: Secondary | ICD-10-CM

## 2012-01-31 DIAGNOSIS — R0609 Other forms of dyspnea: Secondary | ICD-10-CM | POA: Insufficient documentation

## 2012-01-31 MED ORDER — TECHNETIUM TC 99M TETROFOSMIN IV KIT
10.9000 | PACK | Freq: Once | INTRAVENOUS | Status: AC | PRN
  Administered 2012-01-31: 10.9 via INTRAVENOUS

## 2012-01-31 MED ORDER — TECHNETIUM TC 99M TETROFOSMIN IV KIT
33.0000 | PACK | Freq: Once | INTRAVENOUS | Status: AC | PRN
  Administered 2012-01-31: 33 via INTRAVENOUS

## 2012-01-31 MED ORDER — REGADENOSON 0.4 MG/5ML IV SOLN
0.4000 mg | Freq: Once | INTRAVENOUS | Status: AC
  Administered 2012-01-31: 0.4 mg via INTRAVENOUS

## 2012-01-31 NOTE — Progress Notes (Signed)
[facility] 3 NUCLEAR MED [address] [place] [address] [phone]  Cardiology Nuclear Med Study  [name] is a 78 y.o. female     MRN : [id number]     DOB: 19-Oct-1926  Procedure Date: 01/31/2012  Nuclear Med Background Indication for Stress Test:  Evaluation for Ischemia History:  COPD, (L) Breast CA mastectomy Cardiac Risk Factors: Hypertension and Lipids  Symptoms:  Chest Pain, DOE, Palpitations and SOB   Nuclear Pre-Procedure Caffeine/Decaff Intake:  None NPO After: 9:30pm   Lungs:  clear O2 Sat: 94% on room air. IV 0.9% NS with Angio Cath:  22g  IV Site: R Hand  IV Started by:  Milana Na, EMT-P  Chest Size (in):  36 Cup Size: C  Height: 5' (1.524 m)  Weight:  157 lb (71.215 kg)  BMI:  Body mass index is 30.66 kg/(m^2). Tech Comments:  No Rx this am    Nuclear Med Study 1 or 2 day study: 1 day  Stress Test Type:  Eugenie Birks  Reading MD: Dietrich Pates, MD  Order Authorizing Provider:  P.Nishan MD  Resting Radionuclide: Technetium 50m Tetrofosmin  Resting Radionuclide Dose: 10.5 mCi   Stress Radionuclide:  Technetium 48m Tetrofosmin  Stress Radionuclide Dose: 31.8 mCi           Stress Protocol Rest HR: 101 Stress HR: 114  Rest BP: 153/65 Stress BP: 182/59  Exercise Time (min): n/a METS: n/a   Predicted Max HR: 138 bpm % Max HR: 82.61 bpm Rate Pressure Product: 21308   Dose of Adenosine (mg):  n/a Dose of Lexiscan: 0.4 mg  Dose of Atropine (mg): n/a Dose of Dobutamine: n/a mcg/kg/min (at max HR)  Stress Test Technologist: Milana Na, EMT-P  Nuclear Technologist:  Domenic Polite, CNMT     Rest Procedure:  Myocardial perfusion imaging was performed at rest 45 minutes following the intravenous administration of Technetium 65m Tetrofosmin. Rest ECG: NSR-RBBB  Stress Procedure:  The patient received IV Lexiscan 0.4 mg over 15-seconds.  Technetium 63m Tetrofosmin injected at 30-seconds.  There were no significant changes,  no symptoms, and a rare pac with Lexiscan.  Quantitative spect images were obtained after a 45 minute delay. Stress ECG: No significant change from baseline ECG  QPS Raw Data Images:  Images were motion corrected.  Soft tissue (diaphragm, bowel activity) underlie heart. Stress Images:  Normal homogeneous uptake in all areas of the myocardium. Rest Images:  Normal homogeneous uptake in all areas of the myocardium. Subtraction (SDS):  No evidence of ischemia. Transient Ischemic Dilatation (Normal <1.22):  0.98 Lung/Heart Ratio (Normal <0.45):  0.29  Quantitative Gated Spect Images QGS EDV:  46 ml QGS ESV:  8 ml  Impression Exercise Capacity:  Lexiscan with no exercise. BP Response:  Normal blood pressure response. Clinical Symptoms:  No chest pain. ECG Impression:  No significant ST segment change suggestive of ischemia. Comparison with Prior Nuclear Study: No prior scan.  Overall Impression:  Normal stress nuclear study.  LV Ejection Fraction: 83%.  LV Wall Motion:  NL LV Function; NL Wall Motion  Dietrich Pates

## 2012-02-03 ENCOUNTER — Telehealth: Payer: Self-pay | Admitting: *Deleted

## 2012-02-03 NOTE — Telephone Encounter (Signed)
Received fax from [facility] for diabetes testing supplies.  Called patient at home number and left a message on machine for her to return call to verify if she uses this company.  Form on my desk.

## 2012-02-04 ENCOUNTER — Telehealth: Payer: Self-pay

## 2012-02-04 MED ORDER — OMEPRAZOLE 40 MG PO CPDR: 40.0000 mg | DELAYED_RELEASE_CAPSULE | Freq: Two times a day (BID) | ORAL | Status: DC

## 2012-02-04 NOTE — Telephone Encounter (Signed)
Prior auth needed for Omeprazole. Form on shelf  in box.

## 2012-02-04 NOTE — Telephone Encounter (Signed)
Done and in IN box 

## 2012-02-04 NOTE — Telephone Encounter (Signed)
Spoke with patient and she does use Nurse, [employer].  Form in your IN box.

## 2012-02-08 NOTE — Telephone Encounter (Signed)
Form faxed to [employer] at [phone].

## 2012-02-08 NOTE — Telephone Encounter (Signed)
PA faxed to [facility] at [phone].  Will give forms back to Rena to wait for approval/denial.

## 2012-02-09 ENCOUNTER — Telehealth: Payer: Self-pay | Admitting: Cardiovascular Disease

## 2012-02-09 NOTE — Telephone Encounter (Signed)
PT AWARE OF ECHO RESULTS./CY 

## 2012-02-09 NOTE — Telephone Encounter (Signed)
Prior auth given omeprazole; letter faxed to [facility];letter placed on drs desk for signature and scanning.

## 2012-02-09 NOTE — Telephone Encounter (Signed)
Fu call Patient returning you call

## 2012-02-09 NOTE — Telephone Encounter (Signed)
Follow up from previous call:  Patient rtn call back to nurse Wynona Canes

## 2012-02-21 ENCOUNTER — Telehealth: Payer: Self-pay | Admitting: Family Medicine

## 2012-02-21 NOTE — Telephone Encounter (Signed)
Please find out what the deal with if she had a no show-thanks

## 2012-02-21 NOTE — Telephone Encounter (Signed)
Caller: Jessia/Patient; PCP: Roxy Manns A.; CB#: [phone]; ; ; Call regarding Billing Statement Questions, No Show Charge; Per Pt, I was advice a few days before appt d/t Dr Milinda Antis informed PT she would have to come back.  Pt not sure what the statement is regarding.  PLEASE CALL PT BACK, MAY LEAVE MSG, [phone].

## 2012-02-24 NOTE — Telephone Encounter (Signed)
Spoke with patient and will send and patient says she was not aware of missing an appointment. Reviewed patient's history and she does not have other no shows. Will send email to remove $50.00 no show.  Explained to patient that I was removing the $50 no show but to be aware that she had another balance that she will more than likely receive another bill for the amount at the other office.

## 2012-04-13 ENCOUNTER — Encounter: Payer: Self-pay | Admitting: Family Medicine

## 2012-04-18 ENCOUNTER — Ambulatory Visit: Payer: Medicare Other | Admitting: Pulmonary Disease

## 2012-04-21 ENCOUNTER — Other Ambulatory Visit: Payer: Medicare Other

## 2012-04-24 ENCOUNTER — Ambulatory Visit: Payer: Medicare Other | Admitting: Pulmonary Disease

## 2012-04-25 ENCOUNTER — Ambulatory Visit (INDEPENDENT_AMBULATORY_CARE_PROVIDER_SITE_OTHER): Payer: Medicare Other | Admitting: Pulmonary Disease

## 2012-04-25 ENCOUNTER — Telehealth: Payer: Self-pay | Admitting: Family Medicine

## 2012-04-25 ENCOUNTER — Encounter: Payer: Self-pay | Admitting: Pulmonary Disease

## 2012-04-25 VITALS — BP 126/60 | HR 95 | Temp 97.3°F | Ht 60.0 in | Wt 163.8 lb

## 2012-04-25 DIAGNOSIS — I10 Essential (primary) hypertension: Secondary | ICD-10-CM

## 2012-04-25 DIAGNOSIS — R05 Cough: Secondary | ICD-10-CM

## 2012-04-25 DIAGNOSIS — E538 Deficiency of other specified B group vitamins: Secondary | ICD-10-CM

## 2012-04-25 DIAGNOSIS — D649 Anemia, unspecified: Secondary | ICD-10-CM

## 2012-04-25 DIAGNOSIS — E119 Type 2 diabetes mellitus without complications: Secondary | ICD-10-CM

## 2012-04-25 DIAGNOSIS — M810 Age-related osteoporosis without current pathological fracture: Secondary | ICD-10-CM

## 2012-04-25 DIAGNOSIS — K219 Gastro-esophageal reflux disease without esophagitis: Secondary | ICD-10-CM

## 2012-04-25 DIAGNOSIS — E7800 Pure hypercholesterolemia, unspecified: Secondary | ICD-10-CM

## 2012-04-25 NOTE — Patient Instructions (Addendum)
Continue medication for acid reflux am and pm Can continue allergy medication as needed. Continue to avoid throat clearing and overuse of your voice.  If doing well, followup with me one more time in 6mos.

## 2012-04-25 NOTE — Assessment & Plan Note (Signed)
The patient's cough is greatly improved since being on treatment for postnasal drip and laryngopharyngeal reflux.  She is also continuing on her behavioral therapies.  Her cough has not totally resolved, but is very tolerable at its current level.  I have told her that if we continue to minimize irritation to her upper airway, the cough usually resolves slowly over time.  I have asked her to continue with her behavioral therapies, as well as her treatment for postnasal drip and reflux.

## 2012-04-25 NOTE — Telephone Encounter (Signed)
Message copied by Judy Pimple on Tue Apr 25, 2012 10:00 PM ------      Message from: Alvina Chou      Created: Thu Apr 20, 2012  2:37 PM      Regarding: Lab orders for WED 8.14.13       6 month f/u lab orders.... Thanks

## 2012-04-25 NOTE — Progress Notes (Signed)
  Subjective:    Patient ID: [name], female    DOB: 1926-12-08, 78 y.o.   MRN: [id number]  HPI The patient comes in today for followup of her chronic cough, which is felt to be upper airway in origin.  She has seen otolaryngology since the last visit, and she tells me no anatomical abnormalities were found.  She is continued on treatment for reflux as well as postnasal drip, and has also stuck to the behavioral therapies I have outlined.  Her cough has greatly improved, but has not totally resolved as of yet.  She may go days without coughing, but then it can suddenly start for a period of time.   Review of Systems  Constitutional: Negative for fever and unexpected weight change.  HENT: Negative for ear pain, nosebleeds, congestion, sore throat, rhinorrhea, sneezing, trouble swallowing, dental problem, postnasal drip and sinus pressure.   Eyes: Negative for redness and itching.  Respiratory: Positive for cough and shortness of breath. Negative for chest tightness and wheezing.   Cardiovascular: Negative for palpitations and leg swelling.  Gastrointestinal: Negative for nausea and vomiting.  Genitourinary: Negative for dysuria.  Musculoskeletal: Negative for joint swelling.  Skin: Negative for rash.  Neurological: Negative for headaches.  Hematological: Does not bruise/bleed easily.  Psychiatric/Behavioral: Negative for dysphoric mood. The patient is not nervous/anxious.   All other systems reviewed and are negative.       Objective:   Physical Exam Overweight female in no acute distress Nose without purulence or discharge noted Oropharynx clear Chest clear to auscultation Lower extremities with mild edema, no cyanosis Alert and oriented, moves all 4 extremities.       Assessment & Plan:

## 2012-04-26 ENCOUNTER — Other Ambulatory Visit (INDEPENDENT_AMBULATORY_CARE_PROVIDER_SITE_OTHER): Payer: Medicare Other

## 2012-04-26 DIAGNOSIS — E119 Type 2 diabetes mellitus without complications: Secondary | ICD-10-CM

## 2012-04-26 DIAGNOSIS — I10 Essential (primary) hypertension: Secondary | ICD-10-CM

## 2012-04-26 DIAGNOSIS — M810 Age-related osteoporosis without current pathological fracture: Secondary | ICD-10-CM

## 2012-04-26 DIAGNOSIS — D649 Anemia, unspecified: Secondary | ICD-10-CM

## 2012-04-26 DIAGNOSIS — E7800 Pure hypercholesterolemia, unspecified: Secondary | ICD-10-CM

## 2012-04-26 DIAGNOSIS — K219 Gastro-esophageal reflux disease without esophagitis: Secondary | ICD-10-CM

## 2012-04-26 LAB — CBC WITH DIFFERENTIAL/PLATELET
Basophils Relative: 0.4 % (ref 0.0–3.0)
Eosinophils Relative: 3 % (ref 0.0–5.0)
Hemoglobin: 14.2 g/dL (ref 12.0–15.0)
Lymphocytes Relative: 13 % (ref 12.0–46.0)
Monocytes Relative: 10.8 % (ref 3.0–12.0)
Neutro Abs: 5.6 10*3/uL (ref 1.4–7.7)
RBC: 4.75 Mil/uL (ref 3.87–5.11)

## 2012-04-26 LAB — COMPREHENSIVE METABOLIC PANEL
AST: 21 U/L (ref 0–37)
Albumin: 4.4 g/dL (ref 3.5–5.2)
BUN: 17 mg/dL (ref 6–23)
CO2: 27 mEq/L (ref 19–32)
Calcium: 9.6 mg/dL (ref 8.4–10.5)
Chloride: 99 mEq/L (ref 96–112)
Glucose, Bld: 93 mg/dL (ref 70–99)
Potassium: 4.2 mEq/L (ref 3.5–5.1)

## 2012-04-26 LAB — TSH: TSH: 2.73 u[IU]/mL (ref 0.35–5.50)

## 2012-04-26 LAB — LIPID PANEL
LDL Cholesterol: 94 mg/dL (ref 0–99)
Total CHOL/HDL Ratio: 3
VLDL: 20.2 mg/dL (ref 0.0–40.0)

## 2012-04-27 LAB — VITAMIN D 25 HYDROXY (VIT D DEFICIENCY, FRACTURES): Vit D, 25-Hydroxy: 54 ng/mL (ref 30–89)

## 2012-04-28 ENCOUNTER — Ambulatory Visit: Payer: Medicare Other | Admitting: Family Medicine

## 2012-05-02 ENCOUNTER — Ambulatory Visit: Payer: Medicare Other | Admitting: Family Medicine

## 2012-05-03 ENCOUNTER — Encounter: Payer: Self-pay | Admitting: Family Medicine

## 2012-05-03 ENCOUNTER — Ambulatory Visit (INDEPENDENT_AMBULATORY_CARE_PROVIDER_SITE_OTHER): Payer: Medicare Other | Admitting: Family Medicine

## 2012-05-03 VITALS — BP 138/70 | HR 104 | Temp 98.8°F | Ht 60.0 in | Wt 164.0 lb

## 2012-05-03 DIAGNOSIS — E7800 Pure hypercholesterolemia, unspecified: Secondary | ICD-10-CM

## 2012-05-03 DIAGNOSIS — M810 Age-related osteoporosis without current pathological fracture: Secondary | ICD-10-CM

## 2012-05-03 DIAGNOSIS — I10 Essential (primary) hypertension: Secondary | ICD-10-CM

## 2012-05-03 DIAGNOSIS — E119 Type 2 diabetes mellitus without complications: Secondary | ICD-10-CM

## 2012-05-03 NOTE — Patient Instructions (Addendum)
Please send for last colonoscopy - ? May be in her old chart here  Blood pressure is good Labs are reassuring - cholesterol is up a bit so watch your diet Avoid red meat/ fried foods/ egg yolks/ fatty breakfast meats/ butter, cheese and high fat dairy/ and shellfish   Follow up in 6 months with labs prior for annual exam

## 2012-05-03 NOTE — Assessment & Plan Note (Signed)
bp in fair control at this time  No changes needed  Disc lifstyle change with low sodium diet and exercise  Rev labs with pt  Working on lifestyle change

## 2012-05-03 NOTE — Assessment & Plan Note (Signed)
Vit D level ok

## 2012-05-03 NOTE — Assessment & Plan Note (Signed)
Diet controlled Up a bit -but still in goal range Disc goals for lipids and reasons to control them Rev labs with pt Rev low sat fat diet in detail

## 2012-05-03 NOTE — Progress Notes (Signed)
Subjective:    Patient ID: [name], female    DOB: [birth year], 78 y.o.   MRN: [id number]  HPI Here for f/u of chronic conditions  Good summer Just got back from the beach   Has been doing well  Had a bunch of doctor appts   bp is stable today  No cp or palpitations or headaches or edema  No side effects to medicines  BP Readings from Last 3 Encounters:  05/03/12 138/70  04/25/12 126/60  01/31/12 153/65     Wt is stable with bmi of 32 Obese- hard to loose Will start back bowling next week Diet - a lot of hard candy to help her cough- most of the time sugar free  Exercise- working around the house- wants to start walking   Sees pulm for her cough- doing better   Vit D level is 54-- on otc vit D Has OP  Hyperglycemia Lab Results  Component Value Date   HGBA1C 6.2 04/26/2012   this is down  Tries to stay away from sweets or use sugar free   Lipids Lab Results  Component Value Date   CHOL 175 04/26/2012   CHOL 143 11/22/2011   CHOL 178 11/01/2011   Lab Results  Component Value Date   HDL 61.10 04/26/2012   HDL 94 11/22/2011   HDL 68.40 11/01/2011   Lab Results  Component Value Date   LDLCALC 94 04/26/2012   LDLCALC 42 11/22/2011   LDLCALC 94 11/01/2011   Lab Results  Component Value Date   TRIG 101.0 04/26/2012   TRIG 35 11/22/2011   TRIG 80.0 11/01/2011   Lab Results  Component Value Date   CHOLHDL 3 04/26/2012   CHOLHDL 1.5 11/22/2011   CHOLHDL 3 11/01/2011   No results found for this basename: LDLDIRECT    Watches her diet somewhat -not as much lately , some fried fish    Patient Active Problem List  Diagnosis  . Pure hypercholesterolemia  . RESTLESS LEG SYNDROME  . HYPERTENSION  . ALLERGIC RHINITIS  . GERD  . CHOLELITHIASIS  . MICROSCOPIC HEMATURIA  . KERATOSIS, SEBORRHEIC NEC  . OSTEOARTHRITIS  . BACK PAIN  . OSTEOPOROSIS  . URINARY INCONTINENCE, MIXED  . Diabetes mellitus type 2, diet-controlled  . BREAST CANCER, HX OF  . Dermatitis of  face  . Obesity  . Chest pain  . Hyponatremia  . Hypokalemia  . Pedal edema  . B12 deficiency  . Chronic cough  . Diaphragm paralysis  . Anemia  . Dyspnea   Past Medical History  Diagnosis Date  . HX: breast cancer 12/99  . HTN (hypertension)   . GERD (gastroesophageal reflux disease)   . Hyperglycemia   . Allergic rhinitis   . Incontinence   . Chronic cough   . Compression fracture   . Diverticulosis   . Hemorrhoids   . Personal history of skin cancer     Basal cell  . Osteoporosis     fosamax in past/ followed by Dr Ophelia Charter   . Endometrial cancer   . Uterine cancer   . Arthritis   . Diabetes mellitus    Past Surgical History  Procedure Date  . Total abdominal hysterectomy w/ bilateral salpingoophorectomy     endometrial cancer  . Mastectomy     Left  . Back surgery 2007    Dr. Ophelia Charter -disk  . Skin cancer excision 10/08    basal cell  . Cholecystectomy 2009  . Cataract extraction  7/10  . Dexa 2/00    LS osteoporsis  . Dexa 2/02    same; OP, LS, spine, hip  . Dexa 3/04    BMD spine; low BMD hip  . Colonoscopy 3/02    Diverticulosis; hemorrhoids   History  Substance Use Topics  . Smoking status: Never Smoker   . Smokeless tobacco: Never Used  . Alcohol Use: No   Family History  Problem Relation Age of Onset  . Colon cancer Brother   . Breast cancer Sister   . Breast cancer Sister    Allergies  Allergen Reactions  . Amoxicillin-Pot Clavulanate Nausea Only  . Clarithromycin Nausea Only  . Codeine Nausea Only  . Sulfonamide Derivatives Nausea Only  . Tetracycline Nausea Only   Current Outpatient Prescriptions on File Prior to Visit  Medication Sig Dispense Refill  . acetaminophen (TYLENOL) 650 MG CR tablet Take 1,300 mg by mouth 2 (two) times daily.        Marland Kitchen aspirin EC 81 MG tablet Take 81 mg by mouth daily.      . calcium carbonate (TUMS - DOSED IN MG ELEMENTAL CALCIUM) 500 MG chewable tablet Chew 3 tablets by mouth daily.       .  Chlorpheniramine Tannate 8 MG TABS Take by mouth at bedtime.       Marland Kitchen diltiazem (CARDIZEM CD) 240 MG 24 hr capsule Take 240 mg by mouth daily.      Marland Kitchen docusate sodium (COLACE) 100 MG capsule Take 100 mg by mouth 2 (two) times daily.        . hydrocortisone valerate cream (WESTCORT) 0.2 % Apply 1 application topically daily as needed. To face.       . Multiple Vitamin (MULITIVITAMIN WITH MINERALS) TABS Take 1 tablet by mouth daily.      Marland Kitchen omeprazole (PRILOSEC) 40 MG capsule Take 1 capsule (40 mg total) by mouth 2 (two) times daily.  60 capsule  11  . rOPINIRole (REQUIP) 1 MG tablet Take 1 mg by mouth daily.      Marland Kitchen spironolactone (ALDACTONE) 25 MG tablet Take 1 tablet (25 mg total) by mouth daily.  30 tablet  11  . vitamin D, CHOLECALCIFEROL, 400 UNITS tablet Take 800 Units by mouth daily.           Review of Systems    Review of Systems  Constitutional: Negative for fever, appetite change, fatigue and unexpected weight change.  Eyes: Negative for pain and visual disturbance.  Respiratory: Negative for cough and shortness of breath.   Cardiovascular: Negative for cp or palpitations    Gastrointestinal: Negative for nausea, diarrhea and constipation.  Genitourinary: Negative for urgency and frequency.  Skin: Negative for pallor or rash   Neurological: Negative for weakness, light-headedness, numbness and headaches.  Hematological: Negative for adenopathy. Does not bruise/bleed easily.  Psychiatric/Behavioral: Negative for dysphoric mood. The patient is not nervous/anxious.      Objective:   Physical Exam  Constitutional: She appears well-developed and well-nourished. No distress.       overwt and well app  HENT:  Head: Normocephalic and atraumatic.  Mouth/Throat: Oropharynx is clear and moist.  Eyes: Conjunctivae and EOM are normal. Pupils are equal, round, and reactive to light. No scleral icterus.  Neck: Normal range of motion. Neck supple. No JVD present. Carotid bruit is not  present. No thyromegaly present.  Cardiovascular: Normal rate, regular rhythm, normal heart sounds and intact distal pulses.  Exam reveals no gallop.   Pulmonary/Chest: Effort  normal and breath sounds normal. No respiratory distress. She has no wheezes.  Abdominal: Soft. Bowel sounds are normal. She exhibits no distension, no abdominal bruit and no mass. There is no tenderness.  Musculoskeletal: She exhibits no edema.       No kyphosis   Lymphadenopathy:    She has no cervical adenopathy.  Neurological: She is alert. She has normal reflexes. No cranial nerve deficit. She exhibits normal muscle tone. Coordination normal.  Skin: Skin is warm and dry. No rash noted. No erythema. No pallor.  Psychiatric: She has a normal mood and affect.          Assessment & Plan:

## 2012-05-03 NOTE — Assessment & Plan Note (Signed)
This is in very good control with diet  Disc low glycemic diet and exercise  Is more active now  Needs to loose wt F/u 6 mo

## 2012-05-26 ENCOUNTER — Encounter: Payer: Self-pay | Admitting: Family Medicine

## 2012-06-14 ENCOUNTER — Ambulatory Visit: Payer: Medicare Other

## 2012-06-15 ENCOUNTER — Ambulatory Visit (INDEPENDENT_AMBULATORY_CARE_PROVIDER_SITE_OTHER): Payer: Medicare Other

## 2012-06-15 DIAGNOSIS — Z23 Encounter for immunization: Secondary | ICD-10-CM

## 2012-08-24 ENCOUNTER — Telehealth: Payer: Self-pay

## 2012-08-24 NOTE — Telephone Encounter (Signed)
[facility] faxed prior auth request for Omeprazole; spoke with Norwood Levo at [phone] started PA over phone; expect response 24-48 hours.

## 2012-08-24 NOTE — Telephone Encounter (Signed)
Denial letter from [facility] received and placed on Dr Royden Purl shelf with information used to complete over the phone prior auth.

## 2012-08-25 MED ORDER — OMEPRAZOLE 40 MG PO CPDR: 40.0000 mg | DELAYED_RELEASE_CAPSULE | Freq: Every day | ORAL | Status: DC

## 2012-08-25 NOTE — Telephone Encounter (Signed)
I read through the papers and it looks they are denying the prior auth- so ask pt please to call her insurance to find out what is covered for her stomach issue / what alternatives they will cover to the omeprazole 40 mg Unless she wants to pay for the med herself, which is doubtful

## 2012-08-25 NOTE — Telephone Encounter (Signed)
Pt notified and said she will call ins. and see what alt. med they will cover and call us back

## 2012-08-25 NOTE — Telephone Encounter (Signed)
Called insurance and only Rx they cover is dexilant but its a $90 co-pay  Called pt to notify her of this and pt said she can't afford it so would like to try to take the omeprazole 40mg  once a day since her insurance covers that and her co-pay would be $7, pt said if she starts having problems with her GERD on 40mg  once a day she would let us know, please advise

## 2012-08-25 NOTE — Telephone Encounter (Signed)
Ok- please change that on her med list and she can keep [place] posted  thanks

## 2012-08-25 NOTE — Telephone Encounter (Signed)
Changed Rx to 1 tab once daily and left pt voicemail letting her know that new Rx was called in to pharm. And to keep [place] updated if she starts having any problems with her GERD

## 2012-08-30 ENCOUNTER — Other Ambulatory Visit: Payer: Self-pay | Admitting: Family Medicine

## 2012-09-25 ENCOUNTER — Encounter: Payer: Self-pay | Admitting: Pulmonary Disease

## 2012-09-25 ENCOUNTER — Ambulatory Visit (INDEPENDENT_AMBULATORY_CARE_PROVIDER_SITE_OTHER): Payer: Medicare Other | Admitting: Pulmonary Disease

## 2012-09-25 VITALS — BP 152/72 | HR 102 | Temp 98.5°F | Ht 60.0 in | Wt 153.8 lb

## 2012-09-25 DIAGNOSIS — R05 Cough: Secondary | ICD-10-CM

## 2012-09-25 MED ORDER — HYDROCODONE-HOMATROPINE 5-1.5 MG/5ML PO SYRP: 5.0000 mL | ORAL_SOLUTION | Freq: Four times a day (QID) | ORAL | Status: DC | PRN

## 2012-09-25 MED ORDER — PREDNISONE 10 MG PO TABS: ORAL_TABLET | ORAL | Status: DC

## 2012-09-25 NOTE — Addendum Note (Signed)
Addended by: Nita Sells on: 09/25/2012 02:51 PM   Modules accepted: Orders

## 2012-09-25 NOTE — Patient Instructions (Addendum)
Will treat with a short course of prednisone to treat airway inflammation Will treat with a cough suppressant if you think you can tolerate hydrocodone (no codeine but in similar family) Work on limiting voice use, use hard candy to keep from clearing throat.  If you take the cough syrup, do not take your allergy medication around the same time.  Let me know if things do not improve.

## 2012-09-25 NOTE — Progress Notes (Signed)
  Subjective:    Patient ID: [name], female    DOB: March 31, 1927, 79 y.o.   MRN: [id number]  HPI Patient comes in today for an acute sick visit.  She has known upper airway coughing that is felt to be secondary to postnasal drip, reflux disease, and cyclical coughing.  She was actually doing okay and told last week when she began to develop a runny nose with postnasal drip, sore throat, and increased coughing.  She had no purulent mucus or significant chest congestion.  She had no increased shortness of breath.  The cough which keep her awake at night, and now she has severe hoarseness.   Review of Systems  Constitutional: Negative for fever and unexpected weight change.  HENT: Positive for congestion, rhinorrhea and voice change ( hoarseness x 1 day). Negative for ear pain, nosebleeds, sore throat, sneezing, trouble swallowing, dental problem, postnasal drip and sinus pressure.   Eyes: Negative for redness and itching.  Respiratory: Positive for cough and wheezing. Negative for chest tightness and shortness of breath.   Cardiovascular: Negative for palpitations and leg swelling.  Gastrointestinal: Negative for nausea and vomiting.  Genitourinary: Negative for dysuria.  Musculoskeletal: Negative for joint swelling.  Skin: Negative for rash.  Neurological: Negative for headaches.  Hematological: Does not bruise/bleed easily.  Psychiatric/Behavioral: Negative for dysphoric mood. The patient is not nervous/anxious.        Objective:   Physical Exam Overweight female in no acute distress Nose without purulence or discharge noted, oropharynx clear Neck without lymphadenopathy or thyromegaly Chest with one isolated squeak in the right base which totally resolved with a cough.  No true wheezing noted Cardiac exam is regular rate and rhythm Lower extremities with mild edema, no cyanosis Alert and oriented, moves all 4 extremities.        Assessment & Plan:

## 2012-09-25 NOTE — Assessment & Plan Note (Signed)
The patient has had a recent upper respiratory infection which sounds more viral than anything else.  However, it has triggered her upper airway/cyclical coughing, and has caused her symptoms to escalate.  I do not think she has an active sino- pulmonary infection at this time, and we'll therefore treat with a short course of prednisone and work on cough suppression.  I also reminded her of the behavioral therapies for cyclical coughing.

## 2012-09-28 ENCOUNTER — Encounter: Payer: Self-pay | Admitting: Family Medicine

## 2012-09-28 ENCOUNTER — Ambulatory Visit (INDEPENDENT_AMBULATORY_CARE_PROVIDER_SITE_OTHER): Payer: Medicare Other | Admitting: Family Medicine

## 2012-09-28 VITALS — BP 144/50 | HR 94 | Temp 98.3°F | Wt 166.5 lb

## 2012-09-28 DIAGNOSIS — R300 Dysuria: Secondary | ICD-10-CM

## 2012-09-28 DIAGNOSIS — K5900 Constipation, unspecified: Secondary | ICD-10-CM

## 2012-09-28 LAB — POCT URINALYSIS DIPSTICK
Ketones, UA: NEGATIVE
Nitrite, UA: NEGATIVE
Protein, UA: NEGATIVE
Urobilinogen, UA: 0.2
pH, UA: 6

## 2012-09-28 MED ORDER — POLYETHYLENE GLYCOL 3350 17 GM/SCOOP PO POWD: 17.0000 g | Freq: Every day | ORAL | Status: DC | PRN

## 2012-09-28 MED ORDER — CIPROFLOXACIN HCL 250 MG PO TABS: 250.0000 mg | ORAL_TABLET | Freq: Two times a day (BID) | ORAL | Status: DC

## 2012-09-28 NOTE — Assessment & Plan Note (Signed)
Did recommend increased fluid intake. Discussed trial of miralax 1/2 - 1 capful daily prn constipation. Pt will try this. No red flags today.

## 2012-09-28 NOTE — Assessment & Plan Note (Addendum)
With some urinary retention.   Able to provide sample - trace LE and small blood, micro without significant WBC/hpf. However given symptoms I did start cipro 250mg  bid x 5 days, UCx sent. To update [place] if sxs persist or worsen. Pt agrees with plan.

## 2012-09-28 NOTE — Patient Instructions (Addendum)
Suspicious for UTI - treat with cipro twice daily for 5 days - update [place] if not improved with this. Push fluids and plenty of rest. May use tylenol for discomfort as needed. Try miralax 1/2 to 1 cap ful daily for constipation as needed

## 2012-09-28 NOTE — Progress Notes (Signed)
  Subjective:    Patient ID: [name], female    DOB: Feb 04, 1927, 79 y.o.   MRN: [id number]  HPI CC: ?UTI  1 wk ago had cyclic cough flare and was seen by Dr. Shelle Iron and started on prednisone and hycodan.  Still some hoarse and coughing but thinks overall improving.  Now 3 d history dysuria, urgency, and some urinary retention - described as incomplete voiding and at times unable to control stream.  Some night sweats. Denies back or abd pain.  No nausea/vomiting.  No hematuria.  No fevers/chills.  No recent UTI.  No recent abx use.  Is on steroids and has noticed sugars spiking on steroids.  Some constipation - has been using stool softener and recently milk of magnesia.  Passing gas fine.  Yesterday had small stool.  Appetite ok.  Asks about stronger constipation medicine.  Past Medical History  Diagnosis Date  . HX: breast cancer 12/99  . HTN (hypertension)   . GERD (gastroesophageal reflux disease)   . Hyperglycemia   . Allergic rhinitis   . Incontinence   . Chronic cough   . Compression fracture   . Diverticulosis   . Hemorrhoids   . Personal history of skin cancer     Basal cell  . Osteoporosis     fosamax in past/ followed by Dr Ophelia Charter   . Endometrial cancer   . Uterine cancer   . Arthritis   . Diabetes mellitus      Review of Systems Per HPI    Objective:   Physical Exam  Nursing note and vitals reviewed. Constitutional: She appears well-developed and well-nourished. No distress.  Abdominal: Soft. Bowel sounds are normal. She exhibits no distension and no mass. There is no hepatosplenomegaly. There is no tenderness. There is no rebound, no guarding and no CVA tenderness.  Musculoskeletal: She exhibits no edema.  Skin: Skin is warm and dry. No rash noted.       Assessment & Plan:

## 2012-09-30 LAB — URINE CULTURE: Colony Count: 40000

## 2012-10-27 ENCOUNTER — Ambulatory Visit: Payer: Medicare Other | Admitting: Pulmonary Disease

## 2012-10-30 ENCOUNTER — Other Ambulatory Visit: Payer: Medicare Other

## 2012-11-06 ENCOUNTER — Encounter: Payer: Medicare Other | Admitting: Family Medicine

## 2012-11-13 ENCOUNTER — Other Ambulatory Visit: Payer: Self-pay | Admitting: Family Medicine

## 2012-11-15 ENCOUNTER — Ambulatory Visit (INDEPENDENT_AMBULATORY_CARE_PROVIDER_SITE_OTHER): Payer: Medicare Other | Admitting: Pulmonary Disease

## 2012-11-15 ENCOUNTER — Encounter: Payer: Self-pay | Admitting: Pulmonary Disease

## 2012-11-15 VITALS — BP 132/68 | HR 78 | Temp 98.1°F | Ht 60.0 in | Wt 167.0 lb

## 2012-11-15 DIAGNOSIS — R05 Cough: Secondary | ICD-10-CM

## 2012-11-15 MED ORDER — OMEPRAZOLE 40 MG PO CPDR: 40.0000 mg | DELAYED_RELEASE_CAPSULE | Freq: Two times a day (BID) | ORAL | Status: DC

## 2012-11-15 NOTE — Patient Instructions (Addendum)
Increase omeprazole 40mg  to one in am AND PM for more aggressive treatment of acid reflux. Take chlorpheniramine 4mg  otc, take 2 at bedtime and one at lunch for next 2-3 weeks. Will start on Qnasl one spray each nostril each am for next few weeks for postnasal drip. Please call me in 2-3 weeks to give update on how things are going.

## 2012-11-15 NOTE — Assessment & Plan Note (Signed)
The patient has a long history of chronic cough that is felt to be secondary to an upper airway issue.  She has responded to treatment of reflux disease and postnasal drip in the past.  I would like to treat her more aggressively for these, and see how she responds.  There is nothing to suggest a pulmonary issue at this time.

## 2012-11-15 NOTE — Progress Notes (Signed)
  Subjective:    Patient ID: [name], female    DOB: Sep 24, 1926, 79 y.o.   MRN: [id number]  HPI Patient comes in today for an acute sick visit related to chronic cough.  This has been a long-standing issue for her, and she has had a negative pulmonary workup in the past.  Her cough has been felt secondary to laryngopharyngeal reflux, as well as postnasal drip.  There is also a cyclical component to her cough.  She has responded in the past to aggressive treatment of reflux and also postnasal drip.  She comes in today where she is having spells of coughing.  She may go all day with no cough, and all of a sudden have an episode.  She does not necessarily have an episode every day.  She comments that she has a tickle in her throat, as well as throat fullness.  Sometimes she has rattling mucus in her throat when she coughs.  She is having ongoing postnasal drip.   Review of Systems  Constitutional: Negative for fever and unexpected weight change.  HENT: Positive for postnasal drip. Negative for ear pain, nosebleeds, congestion, sore throat, rhinorrhea, sneezing, trouble swallowing, dental problem and sinus pressure.   Eyes: Negative for redness and itching.  Respiratory: Positive for cough. Negative for chest tightness, shortness of breath and wheezing.   Cardiovascular: Negative for palpitations and leg swelling.  Gastrointestinal: Negative for nausea and vomiting.  Genitourinary: Negative for dysuria.  Musculoskeletal: Negative for joint swelling.  Skin: Negative for rash.  Neurological: Negative for headaches.  Hematological: Does not bruise/bleed easily.  Psychiatric/Behavioral: Negative for dysphoric mood. The patient is not nervous/anxious.        Objective:   Physical Exam Overweight female in no acute distress Nose without purulence or discharge noted Oropharynx clear Neck without lymphadenopathy or thyromegaly Chest with decreased depth of inspiration, but clear lung sounds  throughout Cardiac exam regular rate and rhythm Lower extremities mild edema, no cyanosis Alert and oriented, moves all 4 extremities.       Assessment & Plan:

## 2012-11-27 ENCOUNTER — Telehealth: Payer: Self-pay | Admitting: *Deleted

## 2012-11-27 NOTE — Telephone Encounter (Signed)
PRIOR AUTH INITIATED FOR OMEPRAZOLE #60 (30DAY) TAKE ONE CAPSULE PO BID.  PA Case [id number] Status: Pending review. You will receive a final determination electronically in CoverMyMeds and via email and fax within 24 to 72 hours. PATIENT AWARE THAT PA HAS BEEN INITIATED AND THAT WE WILL CONTACT HER PHARM ONCE RECEIVED VERIFICATION.

## 2012-11-28 MED ORDER — OMEPRAZOLE 40 MG PO CPDR: 40.0000 mg | DELAYED_RELEASE_CAPSULE | Freq: Two times a day (BID) | ORAL | Status: DC

## 2012-11-28 NOTE — Telephone Encounter (Signed)
Omeprazole 40mg  #60 (30day) Take 1 bid. APPROVED 11/27/12 THROUGH 11/27/2013  PATIENT AWARE. MEDICATION SENT TO PHARM.

## 2012-12-04 ENCOUNTER — Other Ambulatory Visit: Payer: Self-pay | Admitting: Family Medicine

## 2012-12-18 ENCOUNTER — Other Ambulatory Visit: Payer: Self-pay | Admitting: Pulmonary Disease

## 2012-12-18 MED ORDER — OMEPRAZOLE 40 MG PO CPDR: 40.0000 mg | DELAYED_RELEASE_CAPSULE | Freq: Two times a day (BID) | ORAL | Status: DC

## 2012-12-18 NOTE — Telephone Encounter (Signed)
MEDS HAS BEEN APPROVED UNTI. 11-27-13  RX RESENT TO PHARMACY .

## 2013-01-23 ENCOUNTER — Telehealth: Payer: Self-pay | Admitting: Family Medicine

## 2013-01-23 DIAGNOSIS — E538 Deficiency of other specified B group vitamins: Secondary | ICD-10-CM

## 2013-01-23 DIAGNOSIS — R739 Hyperglycemia, unspecified: Secondary | ICD-10-CM

## 2013-01-23 DIAGNOSIS — I10 Essential (primary) hypertension: Secondary | ICD-10-CM

## 2013-01-23 DIAGNOSIS — E7800 Pure hypercholesterolemia, unspecified: Secondary | ICD-10-CM

## 2013-01-23 DIAGNOSIS — M810 Age-related osteoporosis without current pathological fracture: Secondary | ICD-10-CM

## 2013-01-23 NOTE — Telephone Encounter (Signed)
Message copied by Judy Pimple on Tue Jan 23, 2013 10:37 PM ------      Message from: Baldomero Lamy      Created: Thu Jan 11, 2013 12:53 PM      Regarding: Cpx labs 5/14 Wed       Please order  future cpx labs for pt's upcoming lab appt.      Thanks      Tasha       ------

## 2013-01-24 ENCOUNTER — Other Ambulatory Visit (INDEPENDENT_AMBULATORY_CARE_PROVIDER_SITE_OTHER): Payer: Medicare Other

## 2013-01-24 DIAGNOSIS — E871 Hypo-osmolality and hyponatremia: Secondary | ICD-10-CM

## 2013-01-24 DIAGNOSIS — R7309 Other abnormal glucose: Secondary | ICD-10-CM

## 2013-01-24 DIAGNOSIS — E876 Hypokalemia: Secondary | ICD-10-CM

## 2013-01-24 DIAGNOSIS — E7800 Pure hypercholesterolemia, unspecified: Secondary | ICD-10-CM

## 2013-01-24 DIAGNOSIS — I10 Essential (primary) hypertension: Secondary | ICD-10-CM

## 2013-01-24 DIAGNOSIS — R739 Hyperglycemia, unspecified: Secondary | ICD-10-CM

## 2013-01-24 DIAGNOSIS — E538 Deficiency of other specified B group vitamins: Secondary | ICD-10-CM

## 2013-01-24 DIAGNOSIS — D649 Anemia, unspecified: Secondary | ICD-10-CM

## 2013-01-24 LAB — COMPREHENSIVE METABOLIC PANEL
AST: 17 U/L (ref 0–37)
Alkaline Phosphatase: 54 U/L (ref 39–117)
BUN: 18 mg/dL (ref 6–23)
Calcium: 9.7 mg/dL (ref 8.4–10.5)
Chloride: 100 mEq/L (ref 96–112)
Creatinine, Ser: 0.8 mg/dL (ref 0.4–1.2)

## 2013-01-24 LAB — CBC WITH DIFFERENTIAL/PLATELET
Basophils Relative: 0.4 % (ref 0.0–3.0)
Eosinophils Absolute: 0.2 10*3/uL (ref 0.0–0.7)
Hemoglobin: 14.5 g/dL (ref 12.0–15.0)
MCHC: 34.2 g/dL (ref 30.0–36.0)
MCV: 92.2 fl (ref 78.0–100.0)
Monocytes Absolute: 0.8 10*3/uL (ref 0.1–1.0)
Neutro Abs: 4.9 10*3/uL (ref 1.4–7.7)
RBC: 4.59 Mil/uL (ref 3.87–5.11)

## 2013-01-24 LAB — LIPID PANEL
Cholesterol: 164 mg/dL (ref 0–200)
LDL Cholesterol: 83 mg/dL (ref 0–99)
Triglycerides: 104 mg/dL (ref 0.0–149.0)

## 2013-01-29 ENCOUNTER — Other Ambulatory Visit: Payer: Self-pay | Admitting: Family Medicine

## 2013-01-31 ENCOUNTER — Ambulatory Visit (INDEPENDENT_AMBULATORY_CARE_PROVIDER_SITE_OTHER): Payer: Medicare Other | Admitting: Family Medicine

## 2013-01-31 ENCOUNTER — Encounter: Payer: Self-pay | Admitting: Family Medicine

## 2013-01-31 VITALS — BP 132/58 | HR 89 | Temp 98.2°F | Ht 60.0 in | Wt 166.0 lb

## 2013-01-31 DIAGNOSIS — R739 Hyperglycemia, unspecified: Secondary | ICD-10-CM

## 2013-01-31 DIAGNOSIS — R7309 Other abnormal glucose: Secondary | ICD-10-CM

## 2013-01-31 DIAGNOSIS — I10 Essential (primary) hypertension: Secondary | ICD-10-CM

## 2013-01-31 DIAGNOSIS — Z1211 Encounter for screening for malignant neoplasm of colon: Secondary | ICD-10-CM

## 2013-01-31 DIAGNOSIS — E538 Deficiency of other specified B group vitamins: Secondary | ICD-10-CM

## 2013-01-31 DIAGNOSIS — E7800 Pure hypercholesterolemia, unspecified: Secondary | ICD-10-CM

## 2013-01-31 DIAGNOSIS — Z0000 Encounter for general adult medical examination without abnormal findings: Secondary | ICD-10-CM | POA: Insufficient documentation

## 2013-01-31 NOTE — Patient Instructions (Addendum)
Do the stool card for colon cancer screening  Work on getting a living will / advanced directive  Take care of yourself  Cut sugar out of diet - and work on weight loss Follow up in about 6 months with labs prior

## 2013-01-31 NOTE — Progress Notes (Signed)
Subjective:    Patient ID: [name], female    DOB: Jan 21, 1927, 79 y.o.   MRN: [id number]  HPI I have personally reviewed the Medicare Annual Wellness questionnaire and have noted 1. The patient's medical and social history 2. Their use of alcohol, tobacco or illicit drugs 3. Their current medications and supplements 4. The patient's functional ability including ADL's, fall risks, home safety risks and hearing or visual             impairment. 5. Diet and physical activities 6. Evidence for depression or mood disorders  The patients weight, height, BMI have been recorded in the chart and visual acuity is per eye clinic.  I have made referrals, counseling and provided education to the patient based review of the above and I have provided the pt with a written personalized care plan for preventive services.  Has been feeling pretty good   Wt is down 2lb with bmi of 32  Does not eat right like she should / and gets a little exercise going up and down stairs  Has been bowling    See scanned forms.  Routine anticipatory guidance given to patient.  See health maintenance. Flu vaccine 10/13 Shingles 3/13 vaccine  PNA 8/10  Tetanus 8/10 vaccine  Colon 3/02 was norma with tics , and heme cards nl in 2013  Breast cancer screening 7/13 - doing well with hx of breast cancer Nl self breast exam  Gyn visit was 8/13 nl pap and exam - since she had uterine cancer  Advance directive- has power of attorney , not living will  Cognitive function addressed- see scanned forms- and if abnormal then additional documentation follows.  Sometimes misplaces items- then remembers them-no significant problems   Falls-none at all   Mood -is good / has not been depressed   PMH and SH reviewed  Meds, vitals, and allergies reviewed.   ROS: See HPI.  Otherwise negative.    Hyperglycemia- A1c is 6.4 (and up from 6.2)  She eats poorly- too many desserts   Vitamin B12 level nl at 485  Lab Results   Component Value Date   CHOL 164 01/24/2013   CHOL 175 04/26/2012   CHOL 143 11/22/2011   Lab Results  Component Value Date   HDL 60.60 01/24/2013   HDL 56.21 04/26/2012   HDL 94 11/22/2011   Lab Results  Component Value Date   LDLCALC 83 01/24/2013   LDLCALC 94 04/26/2012   LDLCALC 42 11/22/2011   Lab Results  Component Value Date   TRIG 104.0 01/24/2013   TRIG 101.0 04/26/2012   TRIG 35 11/22/2011   Lab Results  Component Value Date   CHOLHDL 3 01/24/2013   CHOLHDL 3 04/26/2012   CHOLHDL 1.5 11/22/2011   No results found for this basename: LDLDIRECT    Good cholesterol - it continues to do well with diet control   Dr Ophelia Charter follows her osteoporosis dexa was about a year ago  She takes her vit D daily   Patient Active Problem List   Diagnosis Date Noted  . Encounter for Medicare annual wellness exam 01/31/2013  . Colon cancer screening 01/31/2013  . Dysuria 09/28/2012  . Constipation 09/28/2012  . Dyspnea 01/17/2012  . Anemia 01/05/2012  . Chronic cough 12/27/2011  . 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 Pedal edema 12/22/2011  . B12 deficiency 12/22/2011  . Chest pain 11/21/2011  . Hyponatremia 11/21/2011  . Hypokalemia 11/21/2011  . Obesity 11/10/2011  . Dermatitis of  face 05/12/2011  . Hyperglycemia 05/11/2010  . Pure hypercholesterolemia 11/03/2009  . BACK PAIN 10/14/2009  . MICROSCOPIC HEMATURIA 12/06/2008  . URINARY INCONTINENCE, MIXED 12/06/2008  . ALLERGIC RHINITIS 04/22/2008  . OSTEOPOROSIS 04/22/2008  . CHOLELITHIASIS 09/06/2007  . RESTLESS LEG SYNDROME 05/05/2007  . KERATOSIS, SEBORRHEIC NEC 05/05/2007  . OSTEOARTHRITIS 12/15/2006  . BREAST CANCER, HX OF 12/11/2006  . HYPERTENSION 11/29/2006  . GERD 11/29/2006   Past Medical History  Diagnosis Date  . HX: breast cancer 12/99  . HTN (hypertension)   . GERD (gastroesophageal reflux disease)   . Hyperglycemia   . Allergic rhinitis   . Incontinence   . Chronic cough   . Compression fracture   .  Diverticulosis   . Hemorrhoids   . Personal history of skin cancer     Basal cell  . Osteoporosis     fosamax in past/ followed by Dr Ophelia Charter   . Endometrial cancer   . Uterine cancer   . Arthritis   . Diabetes mellitus    Past Surgical History  Procedure Laterality Date  . Total abdominal hysterectomy w/ bilateral salpingoophorectomy      endometrial cancer  . Mastectomy      Left  . Back surgery  2007    Dr. Ophelia Charter -disk  . Skin cancer excision  10/08    basal cell  . Cholecystectomy  2009  . Cataract extraction  7/10  . Dexa  2/00    LS osteoporsis  . Dexa  2/02    same; OP, LS, spine, hip  . Dexa  3/04    BMD spine; low BMD hip  . Colonoscopy  3/02    Diverticulosis; hemorrhoids   History  Substance Use Topics  . Smoking status: Never Smoker   . Smokeless tobacco: Never Used  . Alcohol Use: No   Family History  Problem Relation Age of Onset  . Colon cancer Brother   . Breast cancer Sister   . Breast cancer Sister    Allergies  Allergen Reactions  . Amoxicillin-Pot Clavulanate Nausea Only  . Clarithromycin Nausea Only  . Codeine Nausea Only  . Sulfonamide Derivatives Nausea Only  . Tetracycline Nausea Only   Current Outpatient Prescriptions on File Prior to Visit  Medication Sig Dispense Refill  . acetaminophen (TYLENOL) 650 MG CR tablet Take 1,300 mg by mouth 2 (two) times daily.        Marland Kitchen aspirin EC 81 MG tablet Take 81 mg by mouth daily.      . calcium carbonate (TUMS - DOSED IN MG ELEMENTAL CALCIUM) 500 MG chewable tablet Chew 3 tablets by mouth daily.       Marland Kitchen diltiazem (CARDIZEM CD) 240 MG 24 hr capsule Take 240 mg by mouth daily.      Marland Kitchen docusate sodium (COLACE) 100 MG capsule Take 100 mg by mouth 2 (two) times daily.        . hydrocortisone valerate cream (WESTCORT) 0.2 % APPLY TO AFFECTED AREA DAILY AS NEEDED  15 g  1  . Multiple Vitamin (MULITIVITAMIN WITH MINERALS) TABS Take 1 tablet by mouth daily.      Marland Kitchen omeprazole (PRILOSEC) 40 MG capsule Take 1  capsule (40 mg total) by mouth 2 (two) times daily.  60 capsule  6  . polyethylene glycol powder (GLYCOLAX/MIRALAX) powder Take 17 g by mouth daily as needed (constipation).  850 g  1  . rOPINIRole (REQUIP) 1 MG tablet TAKE ONE (1) TABLET BY MOUTH EVERY DAY  90 tablet  1  . spironolactone (ALDACTONE) 25 MG tablet TAKE ONE (1) TABLET BY MOUTH EVERY DAY  30 tablet  2  . vitamin D, CHOLECALCIFEROL, 400 UNITS tablet Take 800 Units by mouth daily.        Marland Kitchen HYDROcodone-homatropine (HYCODAN) 5-1.5 MG/5ML syrup Take 5 mLs by mouth every 6 (six) hours as needed for cough.  180 mL  0   No current facility-administered medications on file prior to visit.    Review of Systems Review of Systems  Constitutional: Negative for fever, appetite change, fatigue and unexpected weight change.  Eyes: Negative for pain and visual disturbance.  Respiratory: Negative for cough and shortness of breath.   Cardiovascular: Negative for cp or palpitations    Gastrointestinal: Negative for nausea, diarrhea and constipation.  Genitourinary: Negative for urgency and frequency.  Skin: Negative for pallor or rash   Neurological: Negative for weakness, light-headedness, numbness and headaches.  Hematological: Negative for adenopathy. Does not bruise/bleed easily.  Psychiatric/Behavioral: Negative for dysphoric mood. The patient is not nervous/anxious.         Objective:   Physical Exam  Constitutional: She appears well-developed and well-nourished. No distress.  HENT:  Head: Normocephalic and atraumatic.  Right Ear: External ear normal.  Left Ear: External ear normal.  Nose: Nose normal.  Mouth/Throat: Oropharynx is clear and moist.  Eyes: Conjunctivae and EOM are normal. Pupils are equal, round, and reactive to light. Right eye exhibits no discharge. Left eye exhibits no discharge. No scleral icterus.  Neck: Normal range of motion. Neck supple. No JVD present. Carotid bruit is not present. No thyromegaly present.   Cardiovascular: Normal rate, regular rhythm and intact distal pulses.  Exam reveals no gallop.   Pulmonary/Chest: Effort normal and breath sounds normal. No respiratory distress. She has no wheezes. She has no rales.  Abdominal: Soft. Bowel sounds are normal. She exhibits no distension, no abdominal bruit and no mass. There is no tenderness.  Musculoskeletal: Normal range of motion. She exhibits no edema and no tenderness.  Lymphadenopathy:    She has no cervical adenopathy.  Neurological: She is alert. She has normal reflexes. No cranial nerve deficit. She exhibits normal muscle tone. Coordination normal.  Skin: Skin is warm and dry. No rash noted. No erythema. No pallor.  SKs and skin tags diffusely  Psychiatric: She has a normal mood and affect.          Assessment & Plan:

## 2013-02-01 NOTE — Assessment & Plan Note (Signed)
a1c up a bit- is borderline diabetic Disc this in detail and outlined a plan for diet and exercise and wt loss  F/u planned

## 2013-02-01 NOTE — Assessment & Plan Note (Addendum)
Reviewed health habits including diet and exercise and skin cancer prevention Also reviewed health mt list, fam hx and immunizations  See HPI Will work on living will / adv directive

## 2013-02-01 NOTE — Assessment & Plan Note (Signed)
ifob card given today  Last colonosc over 10 y ago- unsure if she wants to get one at her age-will let me know

## 2013-02-01 NOTE — Assessment & Plan Note (Signed)
Oral supplementation 1000 mcg per day is holding level steady

## 2013-02-01 NOTE — Assessment & Plan Note (Signed)
Disc goals for lipids and reasons to control them Rev labs with pt Rev low sat fat diet in detail Diet controlled  

## 2013-02-01 NOTE — Assessment & Plan Note (Signed)
bp in fair control at this time  No changes needed  Disc lifstyle change with low sodium diet and exercise   

## 2013-02-06 ENCOUNTER — Other Ambulatory Visit (INDEPENDENT_AMBULATORY_CARE_PROVIDER_SITE_OTHER): Payer: Medicare Other

## 2013-02-06 DIAGNOSIS — Z1211 Encounter for screening for malignant neoplasm of colon: Secondary | ICD-10-CM

## 2013-02-08 ENCOUNTER — Encounter: Payer: Self-pay | Admitting: *Deleted

## 2013-03-06 ENCOUNTER — Other Ambulatory Visit: Payer: Self-pay | Admitting: Family Medicine

## 2013-03-12 ENCOUNTER — Other Ambulatory Visit: Payer: Self-pay | Admitting: Family Medicine

## 2013-04-13 ENCOUNTER — Other Ambulatory Visit: Payer: Self-pay | Admitting: Family Medicine

## 2013-05-10 ENCOUNTER — Ambulatory Visit: Payer: Self-pay | Admitting: Certified Nurse Midwife

## 2013-05-15 ENCOUNTER — Encounter: Payer: Self-pay | Admitting: Internal Medicine

## 2013-05-15 ENCOUNTER — Ambulatory Visit (INDEPENDENT_AMBULATORY_CARE_PROVIDER_SITE_OTHER)
Admission: RE | Admit: 2013-05-15 | Discharge: 2013-05-15 | Disposition: A | Discharge status: Home / Self Care | Payer: Medicare Other | Source: Ambulatory Visit | Attending: Internal Medicine | Admitting: Internal Medicine

## 2013-05-15 ENCOUNTER — Ambulatory Visit (INDEPENDENT_AMBULATORY_CARE_PROVIDER_SITE_OTHER): Payer: Medicare Other | Admitting: Internal Medicine

## 2013-05-15 VITALS — BP 142/74 | HR 78 | Temp 98.4°F | Wt 168.0 lb

## 2013-05-15 DIAGNOSIS — R05 Cough: Secondary | ICD-10-CM

## 2013-05-15 DIAGNOSIS — R0602 Shortness of breath: Secondary | ICD-10-CM

## 2013-05-15 MED ORDER — LEVOFLOXACIN 500 MG PO TABS: 500.0000 mg | ORAL_TABLET | Freq: Every day | ORAL | Status: DC

## 2013-05-15 NOTE — Patient Instructions (Signed)
Cough, Adult  A cough is a reflex. It helps you clear your throat and airways. A cough can help heal your body. A cough can last 2 or 3 weeks (acute) or may last more than 8 weeks (chronic). Some common causes of a cough can include an infection, allergy, or a cold. HOME CARE  Only take medicine as told by your doctor.  If given, take your medicines (antibiotics) as told. Finish them even if you start to feel better.  Use a cold steam vaporizer or humidier in your home. This can help loosen thick spit (secretions).  Sleep so you are almost sitting up (semi-upright). Use pillows to do this. This helps reduce coughing.  Rest as needed.  Stop smoking if you smoke. GET HELP RIGHT AWAY IF:  You have yellowish-white fluid (pus) in your thick spit.  Your cough gets worse.  Your medicine does not reduce coughing, and you are losing sleep.  You cough up blood.  You have trouble breathing.  Your pain gets worse and medicine does not help.  You have a fever. MAKE SURE YOU:   Understand these instructions.  Will watch your condition.  Will get help right away if you are not doing well or get worse. Document Released: 05/13/2011 Document Revised: 11/22/2011 Document Reviewed: 05/13/2011 [employer] Patient Information 2014 [employer].  

## 2013-05-15 NOTE — Progress Notes (Signed)
Subjective:    Patient ID: [name], female    DOB: 08-Sep-1927, 79 y.o.   MRN: [id number]  HPI  Pt presents to the clinic today with c/o a cough. This cough started Saturday night. She is experiencing" coughing fits". These seem to be worse during the night rather than during the day. The cough is nonproductive. She denies associated symptoms. She is taking the hycodan which has helped. This is a chronic issue for her. She does follow with Dr. Shelle Iron for the same. She has had a negative pulmonary workup. He felt that her cough was related to reflux versus post nasal drip. He is treating her aggressively for these. She is on prilosec 40 mg BID, chlortrimeton as well a QNasal spray. She reports this cough is different from her usual cough. Of note, her O@ sat is 88% today. She has not had sick contacts that she is aware of.  Review of Systems      Past Medical History  Diagnosis Date  . HX: breast cancer 12/99  . HTN (hypertension)   . GERD (gastroesophageal reflux disease)   . Hyperglycemia   . Allergic rhinitis   . Incontinence   . Chronic cough   . Compression fracture   . Diverticulosis   . Hemorrhoids   . Personal history of skin cancer     Basal cell  . Osteoporosis     fosamax in past/ followed by Dr Ophelia Charter   . Endometrial cancer   . Uterine cancer   . Arthritis   . Diabetes mellitus     Current Outpatient Prescriptions  Medication Sig Dispense Refill  . acetaminophen (TYLENOL) 650 MG CR tablet Take 1,300 mg by mouth 2 (two) times daily.        Marland Kitchen aspirin EC 81 MG tablet Take 81 mg by mouth daily.      . calcium carbonate (TUMS - DOSED IN MG ELEMENTAL CALCIUM) 500 MG chewable tablet Chew 3 tablets by mouth daily.       . chlorpheniramine (CHLOR-TRIMETON) 4 MG tablet Take 4 mg by mouth 2 (two) times daily as needed for allergies.      Marland Kitchen diltiazem (CARDIZEM CD) 240 MG 24 hr capsule TAKE ONE CAPSULE BY MOUTH DAILY  90 capsule  1  . docusate sodium (COLACE) 100 MG capsule  Take 100 mg by mouth 2 (two) times daily.        Marland Kitchen HYDROcodone-homatropine (HYCODAN) 5-1.5 MG/5ML syrup Take 5 mLs by mouth every 6 (six) hours as needed for cough.  180 mL  0  . hydrocortisone valerate cream (WESTCORT) 0.2 % APPLY TO AFFECTED AREA DAILY AS NEEDED  15 g  0  . Multiple Vitamin (MULITIVITAMIN WITH MINERALS) TABS Take 1 tablet by mouth daily.      Marland Kitchen omeprazole (PRILOSEC) 40 MG capsule Take 1 capsule (40 mg total) by mouth 2 (two) times daily.  60 capsule  6  . polyethylene glycol powder (GLYCOLAX/MIRALAX) powder Take 17 g by mouth daily as needed (constipation).  850 g  1  . rOPINIRole (REQUIP) 1 MG tablet TAKE ONE (1) TABLET BY MOUTH EVERY DAY  90 tablet  1  . spironolactone (ALDACTONE) 25 MG tablet TAKE ONE (1) TABLET BY MOUTH EVERY DAY  30 tablet  5  . vitamin D, CHOLECALCIFEROL, 400 UNITS tablet Take 800 Units by mouth daily.         No current facility-administered medications for this visit.    Allergies  Allergen Reactions  .  Amoxicillin-Pot Clavulanate Nausea Only  . Clarithromycin Nausea Only  . Codeine Nausea Only  . Sulfonamide Derivatives Nausea Only  . Tetracycline Nausea Only    Family History  Problem Relation Age of Onset  . Colon cancer Brother   . Breast cancer Sister   . Breast cancer Sister     History   Social History  . Marital Status: Single    Spouse Name: N/A    Number of Children: N/A  . Years of Education: N/A   Occupational History  . RETIRED SECRETARY    Social History Main Topics  . Smoking status: Never Smoker   . Smokeless tobacco: Never Used  . Alcohol Use: No  . Drug Use: No  . Sexual Activity: No   Other Topics Concern  . Not on file   Social History Narrative   Goes to [facility] 3 days/week for exercise           Constitutional: Denies fever, malaise, fatigue, headache or abrupt weight changes.  HEENT: Denies eye pain, eye redness, ear pain, ringing in the ears, wax buildup, runny nose, nasal congestion, bloody  nose, or sore throat. Respiratory: Pt reports cough and shortness of breath. Denies difficulty breathing,  or sputum production.   Cardiovascular: Denies chest pain, chest tightness, palpitations or swelling in the hands or feet.  Neurological: Denies dizziness, difficulty with memory, difficulty with speech or problems with balance and coordination.   No other specific complaints in a complete review of systems (except as listed in HPI above).   Objective:   Physical Exam  BP 142/74  Pulse 78  Temp(Src) 98.4 F (36.9 C) (Oral)  Wt 168 lb (76.204 kg)  BMI 32.81 kg/m2  SpO2 88% Wt Readings from Last 3 Encounters:  05/15/13 168 lb (76.204 kg)  01/31/13 166 lb (75.297 kg)  11/15/12 167 lb (75.751 kg)    General: Appears her stated age, well developed, well nourished in NAD. HEENT: Head: normal shape and size; Eyes: sclera white, no icterus, conjunctiva pink, PERRLA and EOMs intact; Ears: Tm's gray and intact, normal light reflex; Nose: mucosa pink and moist, septum midline; Throat/Mouth: Teeth present, mucosa pink and moist, no exudate, lesions or ulcerations noted.  Neck: Normal range of motion. Neck supple, trachea midline. No massses, lumps or thyromegaly present.  Cardiovascular: Normal rate and rhythm. S1,S2 noted.  No murmur, rubs or gallops noted. No JVD or BLE edema. No carotid bruits noted. Pulmonary/Chest: Normal effort and positive vesicular breath sounds, however diminished in the RLL. No respiratory distress. No wheezes, rales or ronchi noted.   Neurological: Alert and oriented. Cranial nerves II-XII intact. Coordination normal. +DTRs bilaterally.   BMET    Component Value Date/Time   NA 138 01/24/2013 0825   K 4.8 01/24/2013 0825   CL 100 01/24/2013 0825   CO2 28 01/24/2013 0825   GLUCOSE 90 01/24/2013 0825   BUN 18 01/24/2013 0825   CREATININE 0.8 01/24/2013 0825   CALCIUM 9.7 01/24/2013 0825   GFRNONAA 82* 11/23/2011 0542   GFRAA >90 11/23/2011 0542    Lipid Panel      Component Value Date/Time   CHOL 164 01/24/2013 0825   TRIG 104.0 01/24/2013 0825   HDL 60.60 01/24/2013 0825   CHOLHDL 3 01/24/2013 0825   VLDL 20.8 01/24/2013 0825   LDLCALC 83 01/24/2013 0825    CBC    Component Value Date/Time   WBC 7.1 01/24/2013 0825   WBC 6.1 11/17/2007 1337   RBC 4.59 01/24/2013  0825   RBC 3.73* 12/24/2011 1057   RBC 4.16 11/17/2007 1337   HGB 14.5 01/24/2013 0825   HGB 13.1 11/17/2007 1337   HCT 42.3 01/24/2013 0825   HCT 37.5 11/17/2007 1337   PLT 278.0 01/24/2013 0825   PLT 280 11/17/2007 1337   MCV 92.2 01/24/2013 0825   MCV 90.1 11/17/2007 1337   MCH 31.3 11/22/2011 1337   MCH 31.4 11/17/2007 1337   MCHC 34.2 01/24/2013 0825   MCHC 34.9 11/17/2007 1337   RDW 13.8 01/24/2013 0825   RDW 14.1 11/17/2007 1337   LYMPHSABS 1.2 01/24/2013 0825   LYMPHSABS 1.5 11/17/2007 1337   MONOABS 0.8 01/24/2013 0825   MONOABS 0.8 11/17/2007 1337   EOSABS 0.2 01/24/2013 0825   EOSABS 0.1 11/17/2007 1337   BASOSABS 0.0 01/24/2013 0825   BASOSABS 0.0 11/17/2007 1337    Hgb A1C Lab Results  Component Value Date   HGBA1C 6.4 01/24/2013         Assessment & Plan:   Cough and shortness of breath, additional workup required:  Given low O2 sat will go ahead and check xray to r/o pneumonia Will empirically start Levaquin x 10 days Continue Hycodan as needed for cough  Will call you with the xray results  RTC as needed or if cough persist or worsens

## 2013-05-21 ENCOUNTER — Other Ambulatory Visit: Payer: Self-pay | Admitting: Family Medicine

## 2013-05-21 ENCOUNTER — Ambulatory Visit: Payer: Self-pay | Admitting: Certified Nurse Midwife

## 2013-05-21 NOTE — Telephone Encounter (Signed)
Electronic refill request, please advise  

## 2013-05-21 NOTE — Telephone Encounter (Signed)
Please refill times one  

## 2013-05-22 NOTE — Telephone Encounter (Signed)
done

## 2013-05-25 ENCOUNTER — Encounter: Payer: Self-pay | Admitting: Certified Nurse Midwife

## 2013-05-28 ENCOUNTER — Encounter: Payer: Self-pay | Admitting: Family Medicine

## 2013-05-28 ENCOUNTER — Encounter: Payer: Self-pay | Admitting: Certified Nurse Midwife

## 2013-05-28 ENCOUNTER — Ambulatory Visit (INDEPENDENT_AMBULATORY_CARE_PROVIDER_SITE_OTHER): Payer: Medicare Other | Admitting: Certified Nurse Midwife

## 2013-05-28 VITALS — BP 122/62 | HR 80 | Resp 16 | Ht 60.25 in | Wt 165.0 lb

## 2013-05-28 DIAGNOSIS — Z853 Personal history of malignant neoplasm of breast: Secondary | ICD-10-CM

## 2013-05-28 DIAGNOSIS — Z8542 Personal history of malignant neoplasm of other parts of uterus: Secondary | ICD-10-CM

## 2013-05-28 DIAGNOSIS — Z124 Encounter for screening for malignant neoplasm of cervix: Secondary | ICD-10-CM

## 2013-05-28 DIAGNOSIS — Z01419 Encounter for gynecological examination (general) (routine) without abnormal findings: Secondary | ICD-10-CM

## 2013-05-28 NOTE — Progress Notes (Signed)
79 y.o. G0P0000 Single Caucasian Fe here for annual exam.  Menopausal no HRT. Patient denies vaginal bleeding or vaginal dryness. Sees PCP for aex, labs, Diabetes, Hypertension management. Orthopedic manages Osteoporosis, back and neck issues.  Fairly good year. Patient still driving without any difficulty. No new problems, stress incontinence improved with kegel exercise.  Patient's last menstrual period was 07/08/1989.          Sexually active: no  The current method of family planning is status post hysterectomy.    Exercising: yes  bowling Smoker:  no  Health Maintenance: Pap:  05-04-12 neg MMG:  05/25/13  Colonoscopy: 2002 BMD:   2013 TDaP: 2012? Labs: none Self breast exam: pt checks   reports that she has never smoked. She has never used smokeless tobacco. She reports that she does not drink alcohol or use illicit drugs.  Past Medical History  Diagnosis Date  . HX: breast cancer 12/99  . HTN (hypertension)   . GERD (gastroesophageal reflux disease)   . Hyperglycemia   . Allergic rhinitis   . Incontinence   . Chronic cough   . Compression fracture   . Diverticulosis   . Hemorrhoids   . Personal history of skin cancer     Basal cell  . Osteoporosis     fosamax in past/ followed by Dr Ophelia Charter   . Endometrial cancer   . Uterine cancer   . Arthritis   . Hematuria   . Diabetes mellitus     borderline    Past Surgical History  Procedure Laterality Date  . Total abdominal hysterectomy w/ bilateral salpingoophorectomy      endometrial cancer  . Mastectomy      Left  . Back surgery  2007    Dr. Ophelia Charter -disk  . Skin cancer excision  10/08    basal cell  . Cholecystectomy  2009  . Cataract extraction  7/10  . Dexa  2/00    LS osteoporsis  . Dexa  2/02    same; OP, LS, spine, hip  . Dexa  3/04    BMD spine; low BMD hip  . Colonoscopy  3/02    Diverticulosis; hemorrhoids  . Abdominal hysterectomy      TAH BSO  . Dilation and curettage of uterus    . Shoulder  surgery    . Carpal tunnel release      Current Outpatient Prescriptions  Medication Sig Dispense Refill  . acetaminophen (TYLENOL) 650 MG CR tablet Take 1,300 mg by mouth 2 (two) times daily.        Marland Kitchen aspirin EC 81 MG tablet Take 81 mg by mouth daily.      . calcium carbonate (TUMS - DOSED IN MG ELEMENTAL CALCIUM) 500 MG chewable tablet Chew 3 tablets by mouth daily.       . chlorpheniramine (CHLOR-TRIMETON) 4 MG tablet Take 4 mg by mouth 2 (two) times daily as needed for allergies.      Marland Kitchen diltiazem (CARDIZEM CD) 240 MG 24 hr capsule TAKE ONE CAPSULE BY MOUTH DAILY  90 capsule  1  . docusate sodium (COLACE) 100 MG capsule Take 100 mg by mouth 2 (two) times daily.        Marland Kitchen HYDROcodone-homatropine (HYCODAN) 5-1.5 MG/5ML syrup Take 5 mLs by mouth every 6 (six) hours as needed for cough.  180 mL  0  . hydrocortisone valerate cream (WESTCORT) 0.2 % APPLY TO AFFECTED AREA DAILY AS NEEDED  15 g  0  . Multiple  Vitamin (MULITIVITAMIN WITH MINERALS) TABS Take 1 tablet by mouth daily.      Marland Kitchen omeprazole (PRILOSEC) 40 MG capsule Take 1 capsule (40 mg total) by mouth 2 (two) times daily.  60 capsule  6  . ondansetron (ZOFRAN) 4 MG tablet TAKE ONE TABLET EVERY 8 HOURS AS NEEDED  30 tablet  0  . polyethylene glycol powder (GLYCOLAX/MIRALAX) powder Take 17 g by mouth daily as needed (constipation).  850 g  1  . rOPINIRole (REQUIP) 1 MG tablet TAKE ONE (1) TABLET BY MOUTH EVERY DAY  90 tablet  1  . spironolactone (ALDACTONE) 25 MG tablet TAKE ONE (1) TABLET BY MOUTH EVERY DAY  30 tablet  5  . vitamin D, CHOLECALCIFEROL, 400 UNITS tablet Take 800 Units by mouth daily.         No current facility-administered medications for this visit.    Family History  Problem Relation Age of Onset  . Colon cancer Brother   . Breast cancer Sister   . Breast cancer Other     ROS:  Pertinent items are noted in HPI.  Otherwise, a comprehensive ROS was negative.  Exam:   BP 122/62  Pulse 80  Resp 16  Ht 5' 0.25"  (1.53 m)  Wt 165 lb (74.844 kg)  BMI 31.97 kg/m2  LMP 07/08/1989 Height: 5' 0.25" (153 cm)  Ht Readings from Last 3 Encounters:  05/28/13 5' 0.25" (1.53 m)  01/31/13 5' (1.524 m)  11/15/12 5' (1.524 m)    General appearance: alert, cooperative and appears stated age Head: Normocephalic, without obvious abnormality, atraumatic Neck: no adenopathy, supple, symmetrical, trachea midline and thyroid normal to inspection and palpation Lungs: clear to auscultation bilaterally Breasts: normal appearance, no masses or tenderness, No nipple retraction or dimpling, No nipple discharge or bleeding, No axillary or supraclavicular adenopathy, right, left no masses noted or lymph node enlargement, mastectomy scar Heart: regular rate and rhythm Abdomen: soft, non-tender; no masses,  no organomegaly Extremities: extremities normal, atraumatic, no cyanosis or edema Skin: Skin color, texture, turgor normal. No rashes or lesions Lymph nodes: Cervical, supraclavicular, and axillary nodes normal. No abnormal inguinal nodes palpated Neurologic: Grossly normal   Pelvic: External genitalia:  no lesions, 3cm sebacceous cyst noted at perineal area 1 fb from rectum, non tender, no redness(patient has had for years) Checked by Dr Farrel Gobble, agree, no treatment needed.              Urethra:  normal appearing urethra with no masses, tenderness or lesions              Bartholin's and Skene's: normal                 Vagina: normal appearing vagina with normal color and discharge, no lesions              Cervix: absent              Pap taken: no Bimanual Exam:  Uterus:  uterus absent              Adnexa: no mass, fullness, tenderness and adnexa absent bilateral               Rectovaginal: Confirms               Anus:  normal sphincter tone, no lesions  A:  Well Woman with normal exam  Menopausal no HRT S/PTAH,BSO endometrial cancer 9/90  History of Breast cancer left 12/99, mastectomy  Hypertension and Diabetes  managed by PCP  Osteoporosis orthopedic management per patient  P:   Reviewed health and wellness pertinent to exam  Stressed importance of aex with history of cancer  Pap smear as per guidelines   Mammogram yearly pap smear taken today  counseled on breast self exam, mammography screening, osteoporosis, adequate intake of calcium and vitamin D, diet and exercise, Kegel's exercises  return annually or prn  An After Visit Summary was printed and given to the patient.

## 2013-05-28 NOTE — Patient Instructions (Addendum)

## 2013-05-29 ENCOUNTER — Encounter: Payer: Self-pay | Admitting: *Deleted

## 2013-05-29 NOTE — Progress Notes (Signed)
Note reviewed, agree with plan.  Douglass Rivers, MD Cyst noted to be minimally mobile, nontender

## 2013-05-29 NOTE — Addendum Note (Signed)
Addended by: Verner Chol on: 05/29/2013 02:56 PM   Modules accepted: Orders

## 2013-05-31 LAB — IPS PAP SMEAR ONLY

## 2013-06-04 NOTE — Progress Notes (Signed)
Waiting on reply from DL 7/82/95

## 2013-07-10 ENCOUNTER — Encounter (HOSPITAL_COMMUNITY): Payer: Self-pay | Admitting: Emergency Medicine

## 2013-07-10 ENCOUNTER — Emergency Department (HOSPITAL_COMMUNITY)
Admission: EM | Admit: 2013-07-10 | Discharge: 2013-07-10 | Disposition: A | Discharge status: Home / Self Care | Payer: Medicare Other | Source: Home / Self Care

## 2013-07-10 ENCOUNTER — Emergency Department (INDEPENDENT_AMBULATORY_CARE_PROVIDER_SITE_OTHER): Payer: Medicare Other

## 2013-07-10 DIAGNOSIS — S20229A Contusion of unspecified back wall of thorax, initial encounter: Secondary | ICD-10-CM

## 2013-07-10 MED ORDER — HYDROCODONE-ACETAMINOPHEN 5-325 MG PO TABS: 1.0000 | ORAL_TABLET | Freq: Four times a day (QID) | ORAL | Status: DC | PRN

## 2013-07-10 NOTE — ED Provider Notes (Signed)
CSN: [id number]     Arrival date & time 07/10/13  1335 History   None    Chief Complaint  Patient presents with  . Fall   (Consider location/radiation/quality/duration/timing/severity/associated sxs/prior Treatment) Patient is a 79 y.o. female presenting with fall. The history is provided by the patient and a relative.  Fall This is a new problem. The current episode started 3 to 5 hours ago (lost balance when came out of shoe and fell backward onto back.). The problem has been gradually worsening. Pertinent negatives include no chest pain, no abdominal pain and no shortness of breath. The symptoms are aggravated by bending.    Past Medical History  Diagnosis Date  . HX: breast cancer 12/99  . HTN (hypertension)   . GERD (gastroesophageal reflux disease)   . Hyperglycemia   . Allergic rhinitis   . Incontinence   . Chronic cough   . Compression fracture   . Diverticulosis   . Hemorrhoids   . Personal history of skin cancer     Basal cell  . Osteoporosis     fosamax in past/ followed by Dr Ophelia Charter   . Endometrial cancer   . Uterine cancer   . Arthritis   . Hematuria   . Diabetes mellitus     borderline   Past Surgical History  Procedure Laterality Date  . Total abdominal hysterectomy w/ bilateral salpingoophorectomy      endometrial cancer  . Mastectomy      Left  . Back surgery  2007    Dr. Ophelia Charter -disk  . Skin cancer excision  10/08    basal cell  . Cholecystectomy  2009  . Cataract extraction  7/10  . Dexa  2/00    LS osteoporsis  . Dexa  2/02    same; OP, LS, spine, hip  . Dexa  3/04    BMD spine; low BMD hip  . Colonoscopy  3/02    Diverticulosis; hemorrhoids  . Abdominal hysterectomy      TAH BSO  . Dilation and curettage of uterus    . Shoulder surgery    . Carpal tunnel release     Family History  Problem Relation Age of Onset  . Colon cancer Brother   . Breast cancer Sister   . Breast cancer Other    History  Substance Use Topics  . Smoking  status: Never Smoker   . Smokeless tobacco: Never Used  . Alcohol Use: No   OB History   Grav Para Term Preterm Abortions TAB SAB Ect Mult Living   0 0 0 0 0 0 0 0 0 0      Review of Systems  Constitutional: Negative.   Respiratory: Negative for shortness of breath.   Cardiovascular: Negative for chest pain.  Gastrointestinal: Negative.  Negative for abdominal pain.  Musculoskeletal: Positive for back pain. Negative for gait problem, joint swelling and myalgias.  Skin: Negative.     Allergies  Amoxicillin-pot clavulanate; Clarithromycin; Codeine; Sulfonamide derivatives; and Tetracycline  Home Medications   Current Outpatient Rx  Name  Route  Sig  Dispense  Refill  . acetaminophen (TYLENOL) 650 MG CR tablet   Oral   Take 1,300 mg by mouth 2 (two) times daily.           Marland Kitchen aspirin EC 81 MG tablet   Oral   Take 81 mg by mouth daily.         . calcium carbonate (TUMS - DOSED IN MG ELEMENTAL CALCIUM) 500  MG chewable tablet   Oral   Chew 3 tablets by mouth daily.          . chlorpheniramine (CHLOR-TRIMETON) 4 MG tablet   Oral   Take 4 mg by mouth 2 (two) times daily as needed for allergies.         Marland Kitchen diltiazem (CARDIZEM CD) 240 MG 24 hr capsule      TAKE ONE CAPSULE BY MOUTH DAILY   90 capsule   1   . docusate sodium (COLACE) 100 MG capsule   Oral   Take 100 mg by mouth 2 (two) times daily.           Marland Kitchen HYDROcodone-acetaminophen (NORCO/VICODIN) 5-325 MG per tablet   Oral   Take 1 tablet by mouth every 6 (six) hours as needed for pain.   10 tablet   1   . HYDROcodone-homatropine (HYCODAN) 5-1.5 MG/5ML syrup   Oral   Take 5 mLs by mouth every 6 (six) hours as needed for cough.   180 mL   0   . hydrocortisone valerate cream (WESTCORT) 0.2 %      APPLY TO AFFECTED AREA DAILY AS NEEDED   15 g   0   . Multiple Vitamin (MULITIVITAMIN WITH MINERALS) TABS   Oral   Take 1 tablet by mouth daily.         Marland Kitchen omeprazole (PRILOSEC) 40 MG capsule   Oral    Take 1 capsule (40 mg total) by mouth 2 (two) times daily.   60 capsule   6     HAS BEEN APPROVED FROM 11-27-12 TO 11-27-13   . ondansetron (ZOFRAN) 4 MG tablet      TAKE ONE TABLET EVERY 8 HOURS AS NEEDED   30 tablet   0   . polyethylene glycol powder (GLYCOLAX/MIRALAX) powder   Oral   Take 17 g by mouth daily as needed (constipation).   850 g   1   . rOPINIRole (REQUIP) 1 MG tablet      TAKE ONE (1) TABLET BY MOUTH EVERY DAY   90 tablet   1   . spironolactone (ALDACTONE) 25 MG tablet      TAKE ONE (1) TABLET BY MOUTH EVERY DAY   30 tablet   5   . vitamin D, CHOLECALCIFEROL, 400 UNITS tablet   Oral   Take 800 Units by mouth daily.            BP 173/79  Pulse 103  Temp(Src) 99 F (37.2 C) (Oral)  Resp 19  SpO2 93%  LMP 07/08/1989 Physical Exam  Nursing note and vitals reviewed. Constitutional: She is oriented to person, place, and time. She appears well-developed and well-nourished.  HENT:  Head: Normocephalic and atraumatic.  Neck: Neck supple.  Abdominal: Soft. Bowel sounds are normal. There is no tenderness.  Musculoskeletal: She exhibits tenderness.       Thoracic back: She exhibits decreased range of motion, tenderness and bony tenderness. She exhibits no swelling, no edema, no deformity and no spasm.  No ext injury , ambulatory, no visible trauma, nvt intact.  Neurological: She is alert and oriented to person, place, and time.  Skin: Skin is warm and dry.    ED Course  Procedures (including critical care time) Labs Review Labs Reviewed - No data to display Imaging Review Dg Thoracolumbar Spine  07/10/2013   CLINICAL DATA:  Fall, pain.  EXAM: THORACOLUMBAR SPINE - 2 VIEW  COMPARISON:  05/15/2013  FINDINGS: Diffuse degenerative  spurring throughout the thoracic spine. Normal alignment. No fracture.  IMPRESSION: Diffuse degenerative changes. No acute findings.   Electronically Signed   By: Charlett Nose M.D.   On: 07/10/2013 15:00      MDM  X-rays  reviewed and report per radiologist.     Linna Hoff, MD 07/10/13 [phone]

## 2013-07-10 NOTE — ED Notes (Signed)
Pt  Reports  She  Larey Seat   This  Am  And  Landed  On  Her lower  Back   Lower  Sacral  Area          Did  Not  Black  Out           She  Ambulated      To  Room

## 2013-07-26 ENCOUNTER — Other Ambulatory Visit: Payer: Self-pay | Admitting: Family Medicine

## 2013-07-29 ENCOUNTER — Telehealth: Payer: Self-pay | Admitting: Family Medicine

## 2013-07-29 DIAGNOSIS — E876 Hypokalemia: Secondary | ICD-10-CM

## 2013-07-29 DIAGNOSIS — R739 Hyperglycemia, unspecified: Secondary | ICD-10-CM

## 2013-07-29 NOTE — Telephone Encounter (Signed)
Message copied by Judy Pimple on Sun Jul 29, 2013 11:56 AM ------      Message from: Alvina Chou      Created: Tue Jul 24, 2013  4:25 PM      Regarding: Lab orders for Monday, 11.17.14       Lab orders for f/u ------

## 2013-07-30 ENCOUNTER — Other Ambulatory Visit: Payer: Self-pay | Admitting: Family Medicine

## 2013-07-30 ENCOUNTER — Other Ambulatory Visit (INDEPENDENT_AMBULATORY_CARE_PROVIDER_SITE_OTHER): Payer: Medicare Other

## 2013-07-30 DIAGNOSIS — E876 Hypokalemia: Secondary | ICD-10-CM

## 2013-07-30 DIAGNOSIS — R7309 Other abnormal glucose: Secondary | ICD-10-CM

## 2013-07-30 DIAGNOSIS — R739 Hyperglycemia, unspecified: Secondary | ICD-10-CM

## 2013-07-30 LAB — COMPREHENSIVE METABOLIC PANEL
ALT: 18 U/L (ref 0–35)
Albumin: 4.2 g/dL (ref 3.5–5.2)
CO2: 25 mEq/L (ref 19–32)
Calcium: 9.6 mg/dL (ref 8.4–10.5)
Chloride: 99 mEq/L (ref 96–112)
GFR: 70.53 mL/min (ref >60.00)
Glucose, Bld: 87 mg/dL (ref 70–99)
Sodium: 137 mEq/L (ref 135–145)
Total Protein: 8 g/dL (ref 6.0–8.3)

## 2013-07-30 LAB — HEMOGLOBIN A1C: Hgb A1c MFr Bld: 6.6 % — ABNORMAL HIGH (ref 4.6–6.5)

## 2013-08-06 ENCOUNTER — Ambulatory Visit (INDEPENDENT_AMBULATORY_CARE_PROVIDER_SITE_OTHER): Payer: Medicare Other | Admitting: Family Medicine

## 2013-08-06 ENCOUNTER — Ambulatory Visit: Payer: Medicare Other | Admitting: Family Medicine

## 2013-08-06 ENCOUNTER — Encounter: Payer: Self-pay | Admitting: Family Medicine

## 2013-08-06 VITALS — BP 132/68 | HR 95 | Temp 98.2°F | Ht 60.25 in | Wt 166.5 lb

## 2013-08-06 DIAGNOSIS — R739 Hyperglycemia, unspecified: Secondary | ICD-10-CM

## 2013-08-06 DIAGNOSIS — I10 Essential (primary) hypertension: Secondary | ICD-10-CM

## 2013-08-06 DIAGNOSIS — R7309 Other abnormal glucose: Secondary | ICD-10-CM

## 2013-08-06 NOTE — Patient Instructions (Signed)
Start watching sugar and carbohydrates in diet carefully  Check feet daily and keep up with a yearly eye exam Your diabetes is very mild and may be treatable with diet/ exercise and weight loss Go to [facility] pharmacy and ask them about their education program- tell them your A1C is 6.6 and you are not on any medicine yet  Follow up with me in 3 months with labs prior Here is some info on diabetes

## 2013-08-06 NOTE — Assessment & Plan Note (Addendum)
A1C is at 6.6 -now in DM range  Disc imp of diet/ exercise/ wt loss for control  Handouts given on DM/diet/ meal planning/ foot care/ exercise Adv to check in with [facility] pharmacy re: their DM ed program  She seems motivated for change at this time Lab and f/u 3 mo  If no imp will need to disc ace  She will schedule annual eye exam Adv to change glucose checks to 2 hours pp >25 min spent with face to face with patient, >50% counseling and/or coordinating care

## 2013-08-06 NOTE — Assessment & Plan Note (Signed)
BP: 132/68 mmHg  bp in fair control at this time  No changes needed Disc lifstyle change with low sodium diet and exercise

## 2013-08-06 NOTE — Progress Notes (Signed)
Pre-visit discussion using our clinic review tool. No additional management support is needed unless otherwise documented below in the visit note.  

## 2013-08-06 NOTE — Progress Notes (Signed)
Subjective:    Patient ID: [name], female    DOB: [birth year], 79 y.o.   MRN: [id number]  HPI Here for f/u of chronic health problems  Feeling fine -no problems   Wt is up 1 lb with bmi of 32  bp is stable today  No cp or palpitations or headaches or edema  No side effects to medicines  BP Readings from Last 3 Encounters:  08/06/13 132/68  07/10/13 173/79  05/28/13 122/62      Hyperglycemia Lab Results  Component Value Date   HGBA1C 6.6* 07/30/2013   this is up from 6.4  Diet--not doing what she should/ she does eat too many sweets  She really likes bread  Exercise - bowls once per week -nothing more than that  She has a treadmill in her kitchen    Patient Active Problem List   Diagnosis Date Noted  . Encounter for Medicare annual wellness exam 01/31/2013  . Colon cancer screening 01/31/2013  . Constipation 09/28/2012  . Dyspnea 01/17/2012  . Anemia 01/05/2012  . Chronic cough 12/27/2011  . 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 Pedal edema 12/22/2011  . B12 deficiency 12/22/2011  . Chest pain 11/21/2011  . Hyponatremia 11/21/2011  . Hypokalemia 11/21/2011  . Obesity 11/10/2011  . Hyperglycemia 05/11/2010  . Pure hypercholesterolemia 11/03/2009  . BACK PAIN 10/14/2009  . MICROSCOPIC HEMATURIA 12/06/2008  . URINARY INCONTINENCE, MIXED 12/06/2008  . ALLERGIC RHINITIS 04/22/2008  . OSTEOPOROSIS 04/22/2008  . CHOLELITHIASIS 09/06/2007  . RESTLESS LEG SYNDROME 05/05/2007  . KERATOSIS, SEBORRHEIC NEC 05/05/2007  . OSTEOARTHRITIS 12/15/2006  . BREAST CANCER, HX OF 12/11/2006  . HYPERTENSION 11/29/2006  . GERD 11/29/2006   Past Medical History  Diagnosis Date  . HX: breast cancer 12/99  . HTN (hypertension)   . GERD (gastroesophageal reflux disease)   . Hyperglycemia   . Allergic rhinitis   . Incontinence   . Chronic cough   . Compression fracture   . Diverticulosis   . Hemorrhoids   . Personal history of skin cancer     Basal cell  . Osteoporosis      fosamax in past/ followed by Dr Ophelia Charter   . Endometrial cancer   . Uterine cancer   . Arthritis   . Hematuria   . Diabetes mellitus     borderline   Past Surgical History  Procedure Laterality Date  . Total abdominal hysterectomy w/ bilateral salpingoophorectomy      endometrial cancer  . Mastectomy      Left  . Back surgery  2007    Dr. Ophelia Charter -disk  . Skin cancer excision  10/08    basal cell  . Cholecystectomy  2009  . Cataract extraction  7/10  . Dexa  2/00    LS osteoporsis  . Dexa  2/02    same; OP, LS, spine, hip  . Dexa  3/04    BMD spine; low BMD hip  . Colonoscopy  3/02    Diverticulosis; hemorrhoids  . Abdominal hysterectomy      TAH BSO  . Dilation and curettage of uterus    . Shoulder surgery    . Carpal tunnel release     History  Substance Use Topics  . Smoking status: Never Smoker   . Smokeless tobacco: Never Used  . Alcohol Use: No   Family History  Problem Relation Age of Onset  . Colon cancer Brother   . Breast cancer Sister   . Breast cancer Other  Allergies  Allergen Reactions  . Amoxicillin-Pot Clavulanate Nausea Only  . Clarithromycin Nausea Only  . Codeine Nausea Only  . Sulfonamide Derivatives Nausea Only  . Tetracycline Nausea Only   Current Outpatient Prescriptions on File Prior to Visit  Medication Sig Dispense Refill  . acetaminophen (TYLENOL) 650 MG CR tablet Take 1,300 mg by mouth 2 (two) times daily.        Marland Kitchen aspirin EC 81 MG tablet Take 81 mg by mouth daily.      . calcium carbonate (TUMS - DOSED IN MG ELEMENTAL CALCIUM) 500 MG chewable tablet Chew 3 tablets by mouth daily.       . chlorpheniramine (CHLOR-TRIMETON) 4 MG tablet Take 4 mg by mouth 2 (two) times daily as needed for allergies.      Marland Kitchen diltiazem (CARDIZEM CD) 240 MG 24 hr capsule TAKE ONE CAPSULE BY MOUTH DAILY  90 capsule  1  . docusate sodium (COLACE) 100 MG capsule Take 100 mg by mouth 2 (two) times daily.        Marland Kitchen HYDROcodone-homatropine (HYCODAN) 5-1.5  MG/5ML syrup Take 5 mLs by mouth every 6 (six) hours as needed for cough.  180 mL  0  . hydrocortisone valerate cream (WESTCORT) 0.2 % APPLY TO AFFECTED AREAS DAILY  15 g  0  . Multiple Vitamin (MULITIVITAMIN WITH MINERALS) TABS Take 1 tablet by mouth daily.      Marland Kitchen omeprazole (PRILOSEC) 40 MG capsule Take 1 capsule (40 mg total) by mouth 2 (two) times daily.  60 capsule  6  . ondansetron (ZOFRAN) 4 MG tablet TAKE ONE TABLET EVERY 8 HOURS AS NEEDED  30 tablet  0  . polyethylene glycol powder (GLYCOLAX/MIRALAX) powder Take 17 g by mouth daily as needed (constipation).  850 g  1  . rOPINIRole (REQUIP) 1 MG tablet TAKE ONE (1) TABLET BY MOUTH EVERY DAY  90 tablet  0  . spironolactone (ALDACTONE) 25 MG tablet TAKE ONE (1) TABLET BY MOUTH EVERY DAY  30 tablet  5  . vitamin D, CHOLECALCIFEROL, 400 UNITS tablet Take 800 Units by mouth daily.         No current facility-administered medications on file prior to visit.     Review of Systems Review of Systems  Constitutional: Negative for fever, appetite change, fatigue and unexpected weight change.  Eyes: Negative for pain and visual disturbance.  Respiratory: Negative for cough and shortness of breath.   Cardiovascular: Negative for cp or palpitations    Gastrointestinal: Negative for nausea, diarrhea and constipation.  Genitourinary: Negative for urgency and frequency. no excessive thirst  Skin: Negative for pallor or rash   Neurological: Negative for weakness, light-headedness, numbness and headaches.  Hematological: Negative for adenopathy. Does not bruise/bleed easily.  Psychiatric/Behavioral: Negative for dysphoric mood. The patient is not nervous/anxious.         Objective:   Physical Exam  Constitutional: She appears well-developed and well-nourished. No distress.  HENT:  Head: Normocephalic and atraumatic.  Right Ear: External ear normal.  Left Ear: External ear normal.  Mouth/Throat: Oropharynx is clear and moist.  Eyes:  Conjunctivae and EOM are normal. Pupils are equal, round, and reactive to light. No scleral icterus.  Neck: Normal range of motion. Neck supple. No JVD present. Carotid bruit is not present. No thyromegaly present.  Cardiovascular: Normal rate, regular rhythm, normal heart sounds and intact distal pulses.  Exam reveals no gallop.   Pulmonary/Chest: Effort normal and breath sounds normal. No respiratory distress. She has  no wheezes. She exhibits no tenderness.  Abdominal: Soft. Bowel sounds are normal. She exhibits no distension and no abdominal bruit. There is no tenderness.  Musculoskeletal: Normal range of motion. She exhibits no edema and no tenderness.  Lymphadenopathy:    She has no cervical adenopathy.  Neurological: She is alert. She has normal reflexes. No cranial nerve deficit. She exhibits normal muscle tone. Coordination normal.  Skin: Skin is warm and dry. No rash noted. No erythema. No pallor.  Psychiatric: She has a normal mood and affect.          Assessment & Plan:

## 2013-08-27 ENCOUNTER — Other Ambulatory Visit: Payer: Self-pay | Admitting: Family Medicine

## 2013-09-10 ENCOUNTER — Other Ambulatory Visit: Payer: Self-pay

## 2013-09-10 MED ORDER — OMEPRAZOLE 40 MG PO CPDR: 40.0000 mg | DELAYED_RELEASE_CAPSULE | Freq: Two times a day (BID) | ORAL | Status: DC

## 2013-09-10 MED ORDER — SPIRONOLACTONE 25 MG PO TABS: ORAL_TABLET | ORAL | Status: DC

## 2013-09-10 NOTE — Telephone Encounter (Signed)
Pt starting to use [employer]; pt thinks she has acct set up? Dr Shelle Iron has prescribed omeprazole in past and pt request Dr Milinda Antis to begin to prescribe 3 month rx to [employer].pt also request 3 mth  refill spironolactone( warning came up when going to refill)Please advise.

## 2013-09-10 NOTE — Telephone Encounter (Signed)
Sent electronically 

## 2013-09-10 NOTE — Telephone Encounter (Signed)
Pt notified Rxs sent 

## 2013-09-11 ENCOUNTER — Other Ambulatory Visit: Payer: Self-pay | Admitting: Family Medicine

## 2013-09-11 ENCOUNTER — Telehealth: Payer: Self-pay

## 2013-09-11 NOTE — Telephone Encounter (Addendum)
See prev note, We just received form today, form is in your inbox

## 2013-09-11 NOTE — Telephone Encounter (Signed)
Done and in IN box 

## 2013-09-11 NOTE — Telephone Encounter (Signed)
Form faxed back to [employer], and Rep with [employer] notified

## 2013-09-11 NOTE — Telephone Encounter (Signed)
Cordelia Pen with [employer] [facility] left v/m checking status on form faxed to Dr Milinda Antis for diabetic testing supplies.Sherry request cb.

## 2013-09-12 ENCOUNTER — Telehealth: Payer: Self-pay | Admitting: *Deleted

## 2013-09-12 NOTE — Telephone Encounter (Signed)
Received prior auth request from pharmacy. Auth forms obtained, completed, and faxed to insurance company. Pending notification from insurance.  

## 2013-09-18 NOTE — Telephone Encounter (Signed)
Ok-thanks - if she needs a new px for omeprazole 40 please send it in - generic and refills for 6 mo  That is confusing

## 2013-09-18 NOTE — Telephone Encounter (Signed)
Left detailed message on voicemail.  

## 2013-09-18 NOTE — Telephone Encounter (Signed)
Please let her know that and ask her to find out what meds her insurance does cover for acid reflux so we can choose one -- if not she will have to get prilosec out of pocket otc thanks

## 2013-09-18 NOTE — Telephone Encounter (Signed)
Omeprazole PA was denied by insurance.

## 2013-09-18 NOTE — Telephone Encounter (Signed)
Patient says she contacted her insurance company and they tell her that she is covered for the Omeprazole.

## 2013-09-19 NOTE — Telephone Encounter (Signed)
Medication has already been refilled.

## 2013-10-26 ENCOUNTER — Other Ambulatory Visit: Payer: Self-pay | Admitting: *Deleted

## 2013-10-26 MED ORDER — ROPINIROLE HCL 1 MG PO TABS: ORAL_TABLET | ORAL | Status: DC

## 2013-10-31 ENCOUNTER — Observation Stay (HOSPITAL_COMMUNITY)
Admission: EM | Admit: 2013-10-31 | Discharge: 2013-11-02 | Disposition: A | Discharge status: Home / Self Care | Payer: Medicare Other | Attending: Internal Medicine | Admitting: Internal Medicine

## 2013-10-31 ENCOUNTER — Encounter (HOSPITAL_COMMUNITY): Payer: Self-pay | Admitting: Emergency Medicine

## 2013-10-31 DIAGNOSIS — Z853 Personal history of malignant neoplasm of breast: Secondary | ICD-10-CM | POA: Insufficient documentation

## 2013-10-31 DIAGNOSIS — A0811 Acute gastroenteropathy due to Norwalk agent: Principal | ICD-10-CM | POA: Insufficient documentation

## 2013-10-31 DIAGNOSIS — I509 Heart failure, unspecified: Secondary | ICD-10-CM | POA: Insufficient documentation

## 2013-10-31 DIAGNOSIS — K529 Noninfective gastroenteritis and colitis, unspecified: Secondary | ICD-10-CM | POA: Diagnosis present

## 2013-10-31 DIAGNOSIS — G2581 Restless legs syndrome: Secondary | ICD-10-CM | POA: Diagnosis present

## 2013-10-31 DIAGNOSIS — I10 Essential (primary) hypertension: Secondary | ICD-10-CM | POA: Diagnosis present

## 2013-10-31 DIAGNOSIS — R739 Hyperglycemia, unspecified: Secondary | ICD-10-CM | POA: Diagnosis present

## 2013-10-31 DIAGNOSIS — E119 Type 2 diabetes mellitus without complications: Secondary | ICD-10-CM | POA: Insufficient documentation

## 2013-10-31 DIAGNOSIS — I5030 Unspecified diastolic (congestive) heart failure: Secondary | ICD-10-CM | POA: Insufficient documentation

## 2013-10-31 DIAGNOSIS — Z7982 Long term (current) use of aspirin: Secondary | ICD-10-CM | POA: Insufficient documentation

## 2013-10-31 DIAGNOSIS — M1990 Unspecified osteoarthritis, unspecified site: Secondary | ICD-10-CM | POA: Diagnosis present

## 2013-10-31 DIAGNOSIS — M810 Age-related osteoporosis without current pathological fracture: Secondary | ICD-10-CM | POA: Insufficient documentation

## 2013-10-31 DIAGNOSIS — E860 Dehydration: Secondary | ICD-10-CM | POA: Insufficient documentation

## 2013-10-31 DIAGNOSIS — D649 Anemia, unspecified: Secondary | ICD-10-CM | POA: Diagnosis present

## 2013-10-31 DIAGNOSIS — K219 Gastro-esophageal reflux disease without esophagitis: Secondary | ICD-10-CM | POA: Diagnosis present

## 2013-10-31 LAB — COMPREHENSIVE METABOLIC PANEL
ALT: 29 U/L (ref 0–35)
AST: 31 U/L (ref 0–37)
Albumin: 4.4 g/dL (ref 3.5–5.2)
Alkaline Phosphatase: 68 U/L (ref 39–117)
BILIRUBIN TOTAL: 0.5 mg/dL (ref 0.3–1.2)
BUN: 28 mg/dL — AB (ref 6–23)
CALCIUM: 9.9 mg/dL (ref 8.4–10.5)
CHLORIDE: 96 meq/L (ref 96–112)
CO2: 25 mEq/L (ref 19–32)
CREATININE: 1.01 mg/dL (ref 0.50–1.10)
GFR calc non Af Amer: 50 mL/min — ABNORMAL LOW (ref >90)
GFR, EST AFRICAN AMERICAN: 58 mL/min — AB (ref >90)
Glucose, Bld: 124 mg/dL — ABNORMAL HIGH (ref 70–99)
Potassium: 4.2 mEq/L (ref 3.7–5.3)
Sodium: 139 mEq/L (ref 137–147)
Total Protein: 9 g/dL — ABNORMAL HIGH (ref 6.0–8.3)

## 2013-10-31 LAB — CBC WITH DIFFERENTIAL/PLATELET
Basophils Absolute: 0 10*3/uL (ref 0.0–0.1)
Basophils Relative: 0 % (ref 0–1)
EOS PCT: 0 % (ref 0–5)
Eosinophils Absolute: 0 10*3/uL (ref 0.0–0.7)
HCT: 45.2 % (ref 36.0–46.0)
HEMOGLOBIN: 16.1 g/dL — AB (ref 12.0–15.0)
LYMPHS PCT: 2 % — AB (ref 12–46)
Lymphs Abs: 0.3 10*3/uL — ABNORMAL LOW (ref 0.7–4.0)
MCH: 32.9 pg (ref 26.0–34.0)
MCHC: 35.6 g/dL (ref 30.0–36.0)
MCV: 92.2 fL (ref 78.0–100.0)
MONOS PCT: 2 % — AB (ref 3–12)
Monocytes Absolute: 0.3 10*3/uL (ref 0.1–1.0)
NEUTROS ABS: 12.3 10*3/uL — AB (ref 1.7–7.7)
Neutrophils Relative %: 96 % — ABNORMAL HIGH (ref 43–77)
Platelets: 289 10*3/uL (ref 150–400)
RBC: 4.9 MIL/uL (ref 3.87–5.11)
RDW: 13.7 % (ref 11.5–15.5)
WBC: 12.9 10*3/uL — AB (ref 4.0–10.5)

## 2013-10-31 LAB — GLUCOSE, CAPILLARY: Glucose-Capillary: 110 mg/dL — ABNORMAL HIGH (ref 70–99)

## 2013-10-31 MED ORDER — ALUM & MAG HYDROXIDE-SIMETH 200-200-20 MG/5ML PO SUSP: 30.0000 mL | Freq: Four times a day (QID) | ORAL | Status: DC | PRN

## 2013-10-31 MED ORDER — ACETAMINOPHEN 325 MG PO TABS: 650.0000 mg | ORAL_TABLET | Freq: Four times a day (QID) | ORAL | Status: DC | PRN

## 2013-10-31 MED ORDER — SPIRONOLACTONE 25 MG PO TABS
25.0000 mg | ORAL_TABLET | Freq: Every day | ORAL | Status: DC
  Administered 2013-11-01 – 2013-11-02 (×2): 25 mg via ORAL
  Filled 2013-10-31 (×2): qty 1

## 2013-10-31 MED ORDER — PANTOPRAZOLE SODIUM 40 MG PO TBEC
40.0000 mg | DELAYED_RELEASE_TABLET | Freq: Every day | ORAL | Status: DC
  Administered 2013-11-01 – 2013-11-02 (×2): 40 mg via ORAL
  Filled 2013-10-31 (×2): qty 1

## 2013-10-31 MED ORDER — ACETAMINOPHEN 650 MG RE SUPP: 650.0000 mg | Freq: Four times a day (QID) | RECTAL | Status: DC | PRN

## 2013-10-31 MED ORDER — SODIUM CHLORIDE 0.9 % IV SOLN
1000.0000 mL | Freq: Once | INTRAVENOUS | Status: AC
  Administered 2013-10-31: 1000 mL via INTRAVENOUS

## 2013-10-31 MED ORDER — ASPIRIN EC 81 MG PO TBEC
81.0000 mg | DELAYED_RELEASE_TABLET | Freq: Every day | ORAL | Status: DC
  Administered 2013-10-31 – 2013-11-02 (×3): 81 mg via ORAL
  Filled 2013-10-31 (×3): qty 1

## 2013-10-31 MED ORDER — ONDANSETRON HCL 4 MG PO TABS: 4.0000 mg | ORAL_TABLET | Freq: Three times a day (TID) | ORAL | Status: DC | PRN

## 2013-10-31 MED ORDER — SODIUM CHLORIDE 0.9 % IV SOLN: 1000.0000 mL | INTRAVENOUS | Status: DC

## 2013-10-31 MED ORDER — CALCIUM CARBONATE ANTACID 500 MG PO CHEW
3.0000 | CHEWABLE_TABLET | Freq: Every day | ORAL | Status: DC
  Administered 2013-11-01 – 2013-11-02 (×2): 600 mg via ORAL
  Filled 2013-10-31 (×2): qty 3

## 2013-10-31 MED ORDER — SODIUM CHLORIDE 0.9 % IV SOLN
INTRAVENOUS | Status: AC
  Administered 2013-10-31: 19:00:00 via INTRAVENOUS

## 2013-10-31 MED ORDER — ENOXAPARIN SODIUM 40 MG/0.4ML ~~LOC~~ SOLN
40.0000 mg | SUBCUTANEOUS | Status: DC
  Administered 2013-10-31 – 2013-11-01 (×2): 40 mg via SUBCUTANEOUS
  Filled 2013-10-31 (×3): qty 0.4

## 2013-10-31 MED ORDER — ONDANSETRON HCL 4 MG/2ML IJ SOLN
4.0000 mg | Freq: Once | INTRAMUSCULAR | Status: AC
  Administered 2013-10-31: 4 mg via INTRAVENOUS
  Filled 2013-10-31: qty 2

## 2013-10-31 MED ORDER — DILTIAZEM HCL ER COATED BEADS 240 MG PO CP24
240.0000 mg | ORAL_CAPSULE | Freq: Every day | ORAL | Status: DC
  Administered 2013-11-01 – 2013-11-02 (×2): 240 mg via ORAL
  Filled 2013-10-31 (×2): qty 1

## 2013-10-31 NOTE — ED Notes (Signed)
To ED via [facility] from home, with c/o nausea/vomiting/diarrhea started last night, Vomiting x 6 -- diarhhea x 5-6 also.

## 2013-10-31 NOTE — H&P (Signed)
[facility] History and Physical  Eupha Slavey [id number] DOB: 08-08-1927 DOA: 10/31/2013  Referring physician: ED physician PCP: Loura Pardon, MD   Chief Complaint: Diarrhea, nausea and vomiting   HPI:  [name] is an 80yo woman with PMH of DM2 (recent diagnosis), GERD, HTN, osteoporosis, OA, RLS and Grade 1 dCHF on TTE (patient was not aware of diagnosis) who presents for < 24 hour history of diarrhea, nausea and vomiting.  She notes that the symptoms started yesterday after dinner (Around 6pm) when she developed acute nausea and vomited up orange colored fluid.  She had about 5-6 episodes of vomiting, the color changed to green, last episode 7am this morning.  She also had diarrhea, about 5-6 episodes, last one this AM as well.  She denied any blood in the emesis or diarrhea.  She has not had any new foods, no mayo based products, no food that was sitting out for a long period of time.  She ate some canned food from the store (not self canned).  She has no sick contacts, no recent travel and no recent Abx use per patient.  She does not endorse abdominal pain but does have a "sick feeling" in her stomach.  The nausea/vomiting/diarrhea do not seem to be related to food intake.  No heartburn.  Except for last night, she is taking her medications as prescribed.  She denies any fever, chills, sweats  Assessment and Plan: Principal Problem:  Gastroenteritis/Dehydration: Likely causes include viral or bacterial.  Concern for bacterial including Cdiff given age and chronic acid suppression with omeprazole.  - IVF at 125cc/hr for 10 hours.  When I saw her, her tachycardia and blood pressure had improved.  She did not note any dizziness or lightheadedness upon standing.  Given her h/o Grade 1 dCHF by TTE in 2013, will give fluids for only 10 hours and reassess - She has a mild uremia and was orthostatic in the ED, will admit for observation.   - GI pathogen panel, stool culture and Cdiff assay for  next stool - Zofran as needed for nausea - Clear liquid diet, I advised her that if she could not keep fluids down without nausea, to stop and attempt a diet again in the AM - Orthostatics in the AM - Repeat BMET, CBC in the AM  Active Problems: Diabetes type 2, controlled - Recent dx per patient, not on any medications.  A1C requested - No SSI at this time due to patient being practically NPO.  Consider adding if needed  HYPERTENSION - BP lying is [id number] systolic, but she is orthostatic.  I have resumed her home BP medications with good hold parameters.    GERD - Protonix substituted for omeprazole, asymptomatic at this time  RESTLESS LEG SYNDROME - She is on requip (ropinirole) at home.  Have held in the setting of orthostatic hypotension.  Likely can restart on discharge.   OSTEOARTHRITIS - Tylenol for pain  Anemia - H/H stable at this time, and likely slightly hemoconcentrated.  - Recheck in AM  DVT PPX: Lovenox SQ   Code Status: Full, discussed with patient Family Communication: Pt and sister at bedside Disposition Plan: pending improvement.  If improved overnight, consider d/c tomorrow.     Review of Systems:  Constitutional: Negative for fever, chills and malaise/fatigue. Negative for diaphoresis.  HENT: Negative for hearing loss, ear pain, congestion, sore throat Eyes: Negative for blurred vision, double vision, photophobia Respiratory: Negative for cough, hemoptysis, sputum production, shortness of breath  Cardiovascular: Positive for occasional chest pain when straining to have BM, none recently; occasional palpitations, none recently. Negative for orthopnea, and leg swelling.  Gastrointestinal: Positive nausea, vomiting, diarrhea.  Negative for heartburn, constipation, blood in stool and melena and abdominal pain Genitourinary: Negative for dysuria, urgency, frequency, hematuria Musculoskeletal: Negative for myalgias, back pain, joint pain and falls.  Skin:  Negative for wound and rash.  Neurological: Negative for dizziness and weakness (reported in ED, denied to me). Negative for tremors, sensory change, speech change, focal weakness, loss of consciousness  Endo/Heme/Allergies: Negative for environmental allergies and polydipsia. Does not bruise/bleed easily.  Psychiatric/Behavioral: The patient is not nervous/anxious.      Past Medical History  Diagnosis Date  . HX: breast cancer 12/99  . HTN (hypertension)   . GERD (gastroesophageal reflux disease)   . Hyperglycemia   . Allergic rhinitis   . Incontinence   . Chronic cough   . Compression fracture   . Diverticulosis   . Hemorrhoids   . Personal history of skin cancer     Basal cell  . Osteoporosis     fosamax in past/ followed by Dr Lorin Mercy   . Endometrial cancer   . Uterine cancer   . Arthritis   . Hematuria   . Diabetes mellitus     borderline    Past Surgical History  Procedure Laterality Date  . Total abdominal hysterectomy w/ bilateral salpingoophorectomy      endometrial cancer  . Mastectomy      Left  . Back surgery  2007    Dr. Lorin Mercy -disk  . Skin cancer excision  10/08    basal cell  . Cholecystectomy  2009  . Cataract extraction  7/10  . Dexa  2/00    LS osteoporsis  . Dexa  2/02    same; OP, LS, spine, hip  . Dexa  3/04    BMD spine; low BMD hip  . Colonoscopy  3/02    Diverticulosis; hemorrhoids  . Abdominal hysterectomy      TAH BSO  . Dilation and curettage of uterus    . Shoulder surgery    . Carpal tunnel release      Social History:  reports that she has never smoked. She has never used smokeless tobacco. She reports that she does not drink alcohol or use illicit drugs.  Allergies  Allergen Reactions  . Amoxicillin-Pot Clavulanate Nausea Only  . Clarithromycin Nausea Only  . Codeine Nausea Only  . Sulfonamide Derivatives Nausea Only  . Tetracycline Nausea Only    Family History  Problem Relation Age of Onset  . Colon cancer Brother    . Breast cancer Sister   . Breast cancer Other    I have reviewed the patients PMH/PSH/FH and allergies.  Prior to Admission medications   Medication Sig Start Date End Date Taking? Authorizing Provider  acetaminophen (TYLENOL) 650 MG CR tablet Take 1,300 mg by mouth 2 (two) times daily.     Yes Historical Provider, MD  aspirin EC 81 MG tablet Take 81 mg by mouth daily.   Yes Historical Provider, MD  calcium carbonate (TUMS - DOSED IN MG ELEMENTAL CALCIUM) 500 MG chewable tablet Chew 3 tablets by mouth daily.    Yes Historical Provider, MD  chlorpheniramine (CHLOR-TRIMETON) 4 MG tablet Take 4 mg by mouth 2 (two) times daily as needed for allergies.   Yes Historical Provider, MD  diltiazem (CARDIZEM CD) 240 MG 24 hr capsule Take 240 mg by mouth  daily.   Yes Historical Provider, MD  docusate sodium (COLACE) 100 MG capsule Take 100 mg by mouth 2 (two) times daily.     Yes Historical Provider, MD  hydrocortisone valerate cream (WESTCORT) 0.2 % Apply 1 application topically daily.   Yes Historical Provider, MD  Multiple Vitamin (MULITIVITAMIN WITH MINERALS) TABS Take 1 tablet by mouth daily.   Yes Historical Provider, MD  omeprazole (PRILOSEC) 40 MG capsule Take 1 capsule (40 mg total) by mouth 2 (two) times daily. 09/10/13  Yes Abner Greenspan, MD  ondansetron (ZOFRAN) 4 MG tablet Take 4 mg by mouth every 8 (eight) hours as needed for nausea or vomiting.   Yes Historical Provider, MD  polyethylene glycol powder (GLYCOLAX/MIRALAX) powder Take 17 g by mouth daily as needed (constipation). 09/28/12  Yes Ria Bush, MD  rOPINIRole (REQUIP) 1 MG tablet Take 1 mg by mouth daily.   Yes Historical Provider, MD  spironolactone (ALDACTONE) 25 MG tablet Take 25 mg by mouth daily.   Yes Historical Provider, MD  vitamin D, CHOLECALCIFEROL, 400 UNITS tablet Take 800 Units by mouth daily.     Yes Historical Provider, MD    Physical Exam: Filed Vitals:   10/31/13 1201 10/31/13 1203 10/31/13 1204 10/31/13  1430  BP: 125/46 113/59 103/44 125/39  Pulse: 91 97 108 93  Temp:      TempSrc:      Resp: 22 18 20 22   SpO2: 96% 97% 97% 97%    Physical Exam  Constitutional: Appears well-developed and well-nourished. No distress.  Elderly woman, wearing glasses HENT: Normocephalic.  Oropharynx is clear and mildly dry mucous membranes.  Eyes: Conjunctivae and EOM are normal. PERRLA, no scleral icterus.  Neck: Normal ROM. Neck supple. CVS: RRR, S1/S2 +, no murmurs, no gallops, no carotid bruit.  Tachycardia improved, HR in the 90s when I saw her.  Pulmonary: Effort and breath sounds normal, no rhonchi, wheezes, rales.  Abdominal: Soft. BS +,  no distension, tenderness, rebound or guarding.  Musculoskeletal:  No edema and no tenderness.  Lymphadenopathy: No lymphadenopathy noted, cervical Neuro: Alert. Normal muscle tone coordination. No cranial nerve deficit. Skin: Skin is warm and dry. No rash noted. Not diaphoretic. No erythema. No pallor.  Psychiatric: Normal mood and affect. Behavior, judgment, thought content normal.   Labs on Admission:  Basic Metabolic Panel:  Recent Labs Lab 10/31/13 1018  NA 139  K 4.2  CL 96  CO2 25  GLUCOSE 124*  BUN 28*  CREATININE 1.01  CALCIUM 9.9   Liver Function Tests:  Recent Labs Lab 10/31/13 1018  AST 31  ALT 29  ALKPHOS 68  BILITOT 0.5  PROT 9.0*  ALBUMIN 4.4   CBC:  Recent Labs Lab 10/31/13 1018  WBC 12.9*  NEUTROABS 12.3*  HGB 16.1*  HCT 45.2  MCV 92.2  PLT 289    MULLEN, EMILY, MD  [facility] Pager [phone]  If 7PM-7AM, please contact night-coverage [email or web address] Password [facility] 10/31/2013, 2:52 PM

## 2013-10-31 NOTE — ED Provider Notes (Signed)
CSN: [id number]     Arrival date & time 10/31/13  [id number] History   First MD Initiated Contact with Patient 10/31/13 [phone]     Chief Complaint  Patient presents with  . Emesis  . Diarrhea  . Weakness    Patient is a 80 y.o. female presenting with vomiting, diarrhea, and weakness. The history is provided by the patient.  Emesis Severity:  Moderate Duration:  1 day Number of daily episodes:  5-6 Quality:  Undigested food, stomach contents and bilious material Progression:  Unchanged Relieved by:  Nothing Associated symptoms: diarrhea   Associated symptoms: no abdominal pain and no fever   Diarrhea Quality:  Watery Severity:  Moderate Duration:  1 day Timing:  Intermittent Associated symptoms: vomiting   Associated symptoms: no abdominal pain   Risk factors: no recent antibiotic use and no travel to endemic areas   Weakness Pertinent negatives include no abdominal pain.    Past Medical History  Diagnosis Date  . HX: breast cancer 12/99  . HTN (hypertension)   . GERD (gastroesophageal reflux disease)   . Hyperglycemia   . Allergic rhinitis   . Incontinence   . Chronic cough   . Compression fracture   . Diverticulosis   . Hemorrhoids   . Personal history of skin cancer     Basal cell  . Osteoporosis     fosamax in past/ followed by Dr Lorin Mercy   . Endometrial cancer   . Uterine cancer   . Arthritis   . Hematuria   . Diabetes mellitus     borderline   Past Surgical History  Procedure Laterality Date  . Total abdominal hysterectomy w/ bilateral salpingoophorectomy      endometrial cancer  . Mastectomy      Left  . Back surgery  2007    Dr. Lorin Mercy -disk  . Skin cancer excision  10/08    basal cell  . Cholecystectomy  2009  . Cataract extraction  7/10  . Dexa  2/00    LS osteoporsis  . Dexa  2/02    same; OP, LS, spine, hip  . Dexa  3/04    BMD spine; low BMD hip  . Colonoscopy  3/02    Diverticulosis; hemorrhoids  . Abdominal hysterectomy      TAH BSO  .  Dilation and curettage of uterus    . Shoulder surgery    . Carpal tunnel release     Family History  Problem Relation Age of Onset  . Colon cancer Brother   . Breast cancer Sister   . Breast cancer Other    History  Substance Use Topics  . Smoking status: Never Smoker   . Smokeless tobacco: Never Used  . Alcohol Use: No   OB History   Grav Para Term Preterm Abortions TAB SAB Ect Mult Living   0 0 0 0 0 0 0 0 0 0      Review of Systems  Gastrointestinal: Positive for vomiting and diarrhea. Negative for abdominal pain.  Neurological: Positive for weakness.  All other systems reviewed and are negative.      Allergies  Amoxicillin-pot clavulanate; Clarithromycin; Codeine; Sulfonamide derivatives; and Tetracycline  Home Medications   Current Outpatient Rx  Name  Route  Sig  Dispense  Refill  . acetaminophen (TYLENOL) 650 MG CR tablet   Oral   Take 1,300 mg by mouth 2 (two) times daily.           Marland Kitchen aspirin EC 81  MG tablet   Oral   Take 81 mg by mouth daily.         . calcium carbonate (TUMS - DOSED IN MG ELEMENTAL CALCIUM) 500 MG chewable tablet   Oral   Chew 3 tablets by mouth daily.          . chlorpheniramine (CHLOR-TRIMETON) 4 MG tablet   Oral   Take 4 mg by mouth 2 (two) times daily as needed for allergies.         Marland Kitchen diltiazem (CARDIZEM CD) 240 MG 24 hr capsule      TAKE ONE (1) CAPSULE BY MOUTH EACH DAY   90 capsule   1   . docusate sodium (COLACE) 100 MG capsule   Oral   Take 100 mg by mouth 2 (two) times daily.           Marland Kitchen HYDROcodone-homatropine (HYCODAN) 5-1.5 MG/5ML syrup   Oral   Take 5 mLs by mouth every 6 (six) hours as needed for cough.   180 mL   0   . hydrocortisone valerate cream (WESTCORT) 0.2 %      APPLY TO AFFECTED AREAS DAILY   15 g   0   . Multiple Vitamin (MULITIVITAMIN WITH MINERALS) TABS   Oral   Take 1 tablet by mouth daily.         Marland Kitchen omeprazole (PRILOSEC) 40 MG capsule   Oral   Take 1 capsule (40 mg  total) by mouth 2 (two) times daily.   180 capsule   3     HAS BEEN APPROVED FROM 11-27-12 TO 11-27-13   . ondansetron (ZOFRAN) 4 MG tablet      TAKE ONE TABLET EVERY 8 HOURS AS NEEDED   30 tablet   0   . polyethylene glycol powder (GLYCOLAX/MIRALAX) powder   Oral   Take 17 g by mouth daily as needed (constipation).   850 g   1   . rOPINIRole (REQUIP) 1 MG tablet      TAKE ONE (1) TABLET BY MOUTH EVERY DAY   90 tablet   1   . spironolactone (ALDACTONE) 25 MG tablet      TAKE ONE (1) TABLET BY MOUTH EVERY DAY   90 tablet   3   . spironolactone (ALDACTONE) 25 MG tablet      TAKE 1 TABLET BY MOUTH DAILY   30 tablet   1   . vitamin D, CHOLECALCIFEROL, 400 UNITS tablet   Oral   Take 800 Units by mouth daily.            BP 151/68  Pulse 115  Temp(Src) 98.4 F (36.9 C) (Oral)  Resp 19  SpO2 94%  LMP 07/08/1989 Physical Exam  Nursing note and vitals reviewed. Constitutional: She appears well-developed and well-nourished. No distress.  HENT:  Head: Normocephalic and atraumatic.  Right Ear: External ear normal.  Left Ear: External ear normal.  Eyes: Conjunctivae are normal. Right eye exhibits no discharge. Left eye exhibits no discharge. No scleral icterus.  Neck: Neck supple. No tracheal deviation present.  Cardiovascular: Regular rhythm and intact distal pulses.  Tachycardia present.   Pulmonary/Chest: Effort normal and breath sounds normal. No stridor. No respiratory distress. She has no wheezes. She has no rales.  Abdominal: Soft. Bowel sounds are normal. She exhibits no distension. There is no tenderness. There is no rebound and no guarding.  Musculoskeletal: She exhibits no edema and no tenderness.  Neurological: She is alert. She has normal  strength. No cranial nerve deficit (no facial droop, extraocular movements intact, no slurred speech) or sensory deficit. She exhibits normal muscle tone. She displays no seizure activity. Coordination normal.  Skin: Skin  is warm and dry. No rash noted.  Psychiatric: She has a normal mood and affect.    ED Course  Procedures (including critical care time) Labs Review Labs Reviewed  CBC WITH DIFFERENTIAL - Abnormal; Notable for the following:    WBC 12.9 (*)    Hemoglobin 16.1 (*)    Neutrophils Relative % 96 (*)    Neutro Abs 12.3 (*)    Lymphocytes Relative 2 (*)    Lymphs Abs 0.3 (*)    Monocytes Relative 2 (*)    All other components within normal limits  COMPREHENSIVE METABOLIC PANEL - Abnormal; Notable for the following:    Glucose, Bld 124 (*)    BUN 28 (*)    Total Protein 9.0 (*)    GFR calc non Af Amer 50 (*)    GFR calc Af Amer 58 (*)    All other components within normal limits    MDM   Final diagnoses:  Dehydration  Gastroenteritis    Patient is improved with IV hydration but she still having difficulty keeping down fluids. She remained slightly tachycardic. Her blood pressures are still in the low range Her laboratory tests do indicate dehydration with prerenal azotemia. Considering her age and persistent symptoms I will consult the medical service regarding admission for IV hydration and further treatment    Kathalene Frames, MD 10/31/13 1329

## 2013-10-31 NOTE — Progress Notes (Signed)
Pt transferred to unit via stretcher from ED. Assessment completed and admission questions answered. Pt oriented to unit and allowed to watch safety video. Although pt seems very steady and independent; bed alarm activated in order to alert staff if pt attempts to get out of bed. SCDs ordered from [employer].

## 2013-10-31 NOTE — ED Notes (Signed)
Pt is a difficult IV stick-- IV team notified--

## 2013-10-31 NOTE — Progress Notes (Signed)
Advanced directive packet given to pt. Pt to read over packet, and inform nursing staff if she wants spiritual care services to be contacted for more information/notary.

## 2013-11-01 DIAGNOSIS — K219 Gastro-esophageal reflux disease without esophagitis: Secondary | ICD-10-CM

## 2013-11-01 DIAGNOSIS — K5289 Other specified noninfective gastroenteritis and colitis: Secondary | ICD-10-CM

## 2013-11-01 LAB — BASIC METABOLIC PANEL
BUN: 14 mg/dL (ref 6–23)
CHLORIDE: 99 meq/L (ref 96–112)
CO2: 21 mEq/L (ref 19–32)
Calcium: 7.9 mg/dL — ABNORMAL LOW (ref 8.4–10.5)
Creatinine, Ser: 0.8 mg/dL (ref 0.50–1.10)
GFR calc Af Amer: 76 mL/min — ABNORMAL LOW (ref >90)
GFR calc non Af Amer: 66 mL/min — ABNORMAL LOW (ref >90)
Glucose, Bld: 93 mg/dL (ref 70–99)
POTASSIUM: 4.1 meq/L (ref 3.7–5.3)
Sodium: 136 mEq/L — ABNORMAL LOW (ref 137–147)

## 2013-11-01 LAB — CBC
HEMATOCRIT: 38.6 % (ref 36.0–46.0)
HEMOGLOBIN: 13 g/dL (ref 12.0–15.0)
MCH: 31.4 pg (ref 26.0–34.0)
MCHC: 33.7 g/dL (ref 30.0–36.0)
MCV: 93.2 fL (ref 78.0–100.0)
Platelets: 226 10*3/uL (ref 150–400)
RBC: 4.14 MIL/uL (ref 3.87–5.11)
RDW: 13.9 % (ref 11.5–15.5)
WBC: 6.1 10*3/uL (ref 4.0–10.5)

## 2013-11-01 LAB — HEMOGLOBIN A1C
Hgb A1c MFr Bld: 6.2 % — ABNORMAL HIGH (ref <5.7)
MEAN PLASMA GLUCOSE: 131 mg/dL — AB (ref <117)

## 2013-11-01 LAB — FECAL LACTOFERRIN, QUANT: FECAL LACTOFERRIN: POSITIVE

## 2013-11-01 LAB — CLOSTRIDIUM DIFFICILE BY PCR: Toxigenic C. Difficile by PCR: NEGATIVE

## 2013-11-01 NOTE — Progress Notes (Signed)
UR completed 

## 2013-11-01 NOTE — Progress Notes (Signed)
[facility] PROGRESS NOTE  [name] [id number] DOB: 12-08-1926 DOA: 10/31/2013 PCP: Madison Pardon, MD  Brief narrative: Ms. Slight is an 80yo woman with PMH of DM2 (recent diagnosis), GERD, HTN, osteoporosis, OA, RLS and Grade 1 dCHF on TTE (patient was not aware of diagnosis) who presented for < 24 hour history of diarrhea, nausea and vomiting.   Assessment/Plan  Gastroenteritis/Dehydration:  - Vitals and labs improved with hydration - Cdiff negative, + stool lactoferin - GI pathogen panel, stool culture pending - Zofran as needed for nausea  - Soft diet today, continued to have loose BM this AM but improved.  Advance to regular diet for dinner if tolerated.  - Orthostatics still mildly positive this AM  Diabetes type 2, controlled  - Recent dx per patient, not on any medications. A1C stable at 6.2 - CBGs stable, no SSI at this time.   HYPERTENSION  - Continue home BP medications with hold parameters.   GERD  - Protonix substituted for omeprazole, asymptomatic at this time   RESTLESS LEG SYNDROME  - Requip held - Restart on discharge  OSTEOARTHRITIS  - Tylenol for pain   DVT PPX: Lovenox SQ    Consultants:  none  Procedures/Studies:  No results found.  none  Antibiotics:  none  Code Status: Full Family Communication: Pt at bedside Disposition Plan: Home when medically stable, likely tomorrow AM  HPI/Subjective: No events overnight.  Loose stool this AM.  Eval after food for ability to tolerate without nausea.   Objective: Filed Vitals:   11/01/13 0629 11/01/13 0630 11/01/13 0942 11/01/13 1400  BP: 121/76 142/51 142/58 132/65  Pulse:   91 88  Temp:   98 F (36.7 C) 98.2 F (36.8 C)  TempSrc:   Oral Oral  Resp:   18 18  Height:      Weight:      SpO2:   96% 98%    Intake/Output Summary (Last 24 hours) at 11/01/13 1552 Last data filed at 11/01/13 1300  Gross per 24 hour  Intake 1265.84 ml  Output      0 ml  Net 1265.84 ml     Exam:   General:  Pt is alert, follows commands appropriately, not in acute distress, elderly woman  Cardiovascular: Regular rate and rhythm, S1/S2  Respiratory: Clear to auscultation bilaterally, no wheezing  Abdomen: Soft, non tender, non distended, bowel sounds present  Extremities: No edema  Neuro: Grossly nonfocal  Data Reviewed: Basic Metabolic Panel:  Recent Labs Lab 10/31/13 1018 11/01/13 0500  NA 139 136*  K 4.2 4.1  CL 96 99  CO2 25 21  GLUCOSE 124* 93  BUN 28* 14  CREATININE 1.01 0.80  CALCIUM 9.9 7.9*   Liver Function Tests:  Recent Labs Lab 10/31/13 1018  AST 31  ALT 29  ALKPHOS 68  BILITOT 0.5  PROT 9.0*  ALBUMIN 4.4  CBC:  Recent Labs Lab 10/31/13 1018 11/01/13 0500  WBC 12.9* 6.1  NEUTROABS 12.3*  --   HGB 16.1* 13.0  HCT 45.2 38.6  MCV 92.2 93.2  PLT 289 226   Cardiac Enzymes: No results found for this basename: CKTOTAL, CKMB, CKMBINDEX, TROPONINI,  in the last 168 hours BNP: No components found with this basename: POCBNP,  CBG:  Recent Labs Lab 10/31/13 1654  GLUCAP 110*    Recent Results (from the past 240 hour(s))  CLOSTRIDIUM DIFFICILE BY PCR     Status: None   Collection Time    10/31/13  7:22 PM      Result Value Ref Range Status   C difficile by pcr NEGATIVE  NEGATIVE Final     Scheduled Meds: . aspirin EC  81 mg Oral Daily  . calcium carbonate  3 tablet Oral Daily  . diltiazem  240 mg Oral Daily  . enoxaparin (LOVENOX) injection  40 mg Subcutaneous Q24H  . pantoprazole  40 mg Oral Daily  . spironolactone  25 mg Oral Daily   Continuous Infusions:    Gilles Chiquito, MD  [place] Pager [phone]  If 7PM-7AM, please contact night-coverage [email or web address] Password [email or web address] 11/01/2013, 3:52 PM   LOS: 1 day

## 2013-11-02 DIAGNOSIS — E119 Type 2 diabetes mellitus without complications: Secondary | ICD-10-CM

## 2013-11-02 LAB — GI PATHOGEN PANEL BY PCR, STOOL
C DIFFICILE TOXIN A/B: NEGATIVE
Campylobacter by PCR: NEGATIVE
Cryptosporidium by PCR: NEGATIVE
E COLI 0157 BY PCR: NEGATIVE
E coli (ETEC) LT/ST: NEGATIVE
E coli (STEC): NEGATIVE
G lamblia by PCR: NEGATIVE
Norovirus GI/GII: POSITIVE
Rotavirus A by PCR: NEGATIVE
SALMONELLA BY PCR: NEGATIVE
Shigella by PCR: NEGATIVE

## 2013-11-02 NOTE — Discharge Summary (Signed)
Physician Discharge Summary  [name] JKK:[id number] DOB: 1926-11-25 DOA: 10/31/2013  PCP: Loura Pardon, MD  Admit date: 10/31/2013 Discharge date: 11/02/2013  Recommendations for Outpatient Follow-up:  1. Pt will need to follow up with PCP in 2-3 weeks post discharge 2. Please obtain BMP to evaluate electrolytes and kidney function 3. Please also check CBC to evaluate Hg and Hct levels  Discharge Diagnoses:  Principal Problem:   Gastroenteritis Active Problems:   RESTLESS LEG SYNDROME   HYPERTENSION   GERD   OSTEOARTHRITIS   Diabetes type 2, controlled   Anemia  Discharge Condition: Stable  Diet recommendation: Heart healthy diet  History of present illness (From H&P):  [name] is an 80yo woman with PMH of DM2 (recent diagnosis), GERD, HTN, osteoporosis, OA, RLS and Grade 1 dCHF on TTE (patient was not aware of diagnosis) who presents for < 24 hour history of diarrhea, nausea and vomiting. She notes that the symptoms started yesterday after dinner (Around 6pm) when she developed acute nausea and vomited up orange colored fluid. She had about 5-6 episodes of vomiting, the color changed to green, last episode 7am this morning. She also had diarrhea, about 5-6 episodes, last one this AM as well. She denied any blood in the emesis or diarrhea. She has not had any new foods, no mayo based products, no food that was sitting out for a long period of time. She ate some canned food from the store (not self canned). She has no sick contacts, no recent travel and no recent Abx use per patient. She does not endorse abdominal pain but does have a "sick feeling" in her stomach. The nausea/vomiting/diarrhea do not seem to be related to food intake. No heartburn. Except for last night, she is taking her medications as prescribed. She denies any fever, chills, sweats   Hospital Course:   Principal Problem:  Gastroenteritis: thought to be likely viral in nature.  She was treated with hydration and  slow advancement of diet with bowel rest and she recovered very quickly.  She continued to have some loose stools, but these had begun to form up by discharge.  She only had some nausea and no vomiting while in house.  She was noted to be Cdiff negative.  After discharge, her pathogen panel did come back norovirus positive, however, I believe she is in the recovery phase.  She was able to keep down all PO both liquids and solids on day of discharge.   Active Problems:  RESTLESS LEG SYNDROME: Requip held due to orthostasis, okay to resume at discharge.   HYPERTENSION: home BP meds were continued, she did not have any frank hypotension while in house, but did have mild orthostatic hypotension which was asymptomatic on day of discharge.   GERD: PPI continued and resumed at discharge.   OSTEOARTHRITIS: Pain control with tylenol  Diabetes type 2, controlled: A1C checked while in house was 6.2, which is improved from previous.  She is to follow up with PCP    Consultations:  none  Antibiotics:  none  Discharge Exam: Filed Vitals:   11/02/13 0904  BP: 117/46  Pulse: 101  Temp: 98.7 F (37.1 C)  Resp: 19   Filed Vitals:   11/01/13 1800 11/01/13 2155 11/02/13 0552 11/02/13 0904  BP: 124/71 125/73 121/57 117/46  Pulse: 94 103 102 101  Temp: 97.8 F (36.6 C) 99 F (37.2 C) 98.8 F (37.1 C) 98.7 F (37.1 C)  TempSrc: Oral Oral Oral Oral  Resp:  18 18 18 19   Height:  5' (1.524 m)    Weight:  152 lb 4.8 oz (69.083 kg)    SpO2: 98% 94% 93% 92%    General: Pt is alert, follows commands appropriately, not in acute distress Cardiovascular: Regular rate and rhythm, S1/S2 +, no murmurs Respiratory: Clear to auscultation bilaterally, no wheezing, no rales Abdominal: Soft, non tender, non distended, bowel sounds + Extremities: no edema Neuro: Grossly nonfocal  Discharge Instructions   Future Appointments Provider Department Dept Phone   11/05/2013 2:30 PM [facility] [place] [phone]   11/07/2013 3:15 PM Abner Greenspan, MD [facility] [place] [phone]   06/04/2014 3:30 PM Regina Eck, [place] [phone]       Medication List    STOP taking these medications       polyethylene glycol powder powder  Commonly known as:  GLYCOLAX/MIRALAX      TAKE these medications       acetaminophen 650 MG CR tablet  Commonly known as:  TYLENOL  Take 1,300 mg by mouth 2 (two) times daily.     aspirin EC 81 MG tablet  Take 81 mg by mouth daily.     calcium carbonate 500 MG chewable tablet  Commonly known as:  TUMS - dosed in mg elemental calcium  Chew 3 tablets by mouth daily.     chlorpheniramine 4 MG tablet  Commonly known as:  CHLOR-TRIMETON  Take 4 mg by mouth 2 (two) times daily as needed for allergies.     diltiazem 240 MG 24 hr capsule  Commonly known as:  CARDIZEM CD  Take 240 mg by mouth daily.     docusate sodium 100 MG capsule  Commonly known as:  COLACE  Take 100 mg by mouth 2 (two) times daily.     hydrocortisone valerate cream 0.2 %  Commonly known as:  WESTCORT  Apply 1 application topically daily.     multivitamin with minerals Tabs tablet  Take 1 tablet by mouth daily.     omeprazole 40 MG capsule  Commonly known as:  PRILOSEC  Take 1 capsule (40 mg total) by mouth 2 (two) times daily.     ondansetron 4 MG tablet  Commonly known as:  ZOFRAN  Take 4 mg by mouth every 8 (eight) hours as needed for nausea or vomiting.     rOPINIRole 1 MG tablet  Commonly known as:  REQUIP  Take 1 mg by mouth daily.     spironolactone 25 MG tablet  Commonly known as:  ALDACTONE  Take 25 mg by mouth daily.     vitamin D (CHOLECALCIFEROL) 400 UNITS tablet  Take 800 Units by mouth daily.           Follow-up Information   Follow up with Loura Pardon, MD. Schedule an appointment as soon as possible for a visit in 2 weeks.   Specialties:  Family Medicine, Radiology    Contact information:   [address] [place] [id number] [phone]        The results of significant diagnostics from this hospitalization (including imaging, microbiology, ancillary and laboratory) are listed below for reference.     Microbiology: Recent Results (from the past 240 hour(s))  CLOSTRIDIUM DIFFICILE BY PCR     Status: None   Collection Time    10/31/13  7:22 PM      Result Value Ref Range  Status   C difficile by pcr NEGATIVE  NEGATIVE Final  STOOL CULTURE     Status: None   Collection Time    10/31/13  7:22 PM      Result Value Ref Range Status   Specimen Description STOOL   Final   Special Requests NONE   Final   Culture     Final   Value: NO SUSPICIOUS COLONIES, CONTINUING TO HOLD     Performed at [facility]   Report Status PENDING   Incomplete     Labs: Basic Metabolic Panel:  Recent Labs Lab 10/31/13 1018 11/01/13 0500  NA 139 136*  K 4.2 4.1  CL 96 99  CO2 25 21  GLUCOSE 124* 93  BUN 28* 14  CREATININE 1.01 0.80  CALCIUM 9.9 7.9*   Liver Function Tests:  Recent Labs Lab 10/31/13 1018  AST 31  ALT 29  ALKPHOS 68  BILITOT 0.5  PROT 9.0*  ALBUMIN 4.4   No results found for this basename: LIPASE, AMYLASE,  in the last 168 hours No results found for this basename: AMMONIA,  in the last 168 hours CBC:  Recent Labs Lab 10/31/13 1018 11/01/13 0500  WBC 12.9* 6.1  NEUTROABS 12.3*  --   HGB 16.1* 13.0  HCT 45.2 38.6  MCV 92.2 93.2  PLT 289 226   CBG:  Recent Labs Lab 10/31/13 1654  GLUCAP 110*     SIGNED: Time coordinating discharge: 35 minutes  Teneka Malmberg, MD  [facility] 11/02/2013, 10:09 AM Pager [phone]  If 7PM-7AM, please contact night-coverage [email or web address] Password [email or web address]

## 2013-11-02 NOTE — Progress Notes (Signed)
Patient was discharged home with family.  Patient was given discharge instructions with education on viral gastroenteritis.  Patient was told to contact doctor with questions and concerns.  Patient was discharged with belongings and patient stable upon discharge.

## 2013-11-02 NOTE — Discharge Instructions (Signed)
[name], you were admitted for nausea, vomiting and diarrhea which was likely related to a virus.  All of the studies of your stool showed no bacteria in the stool.  You are improving and have been walking around your room without difficulty.    Once your loose stools are completed (they may take up to 7 days to completely go away), you can start taking your colace and miralax again.  Until that time, please hold these medications.   Please call your PCP in 1-2 weeks to make an appointment to be seen after being in the hospital.    For any worsening diarrhea, dehydration, fever, chills, nausea, severe vomiting, inability to keep anything by mouth down please call your doctor or come back to the hospital.   Thank you.    Viral Gastroenteritis  Viral gastroenteritis is also known as stomach flu. This condition affects the stomach and intestinal tract. It can cause sudden diarrhea and vomiting. The illness typically lasts 3 to 8 days. Most people develop an immune response that eventually gets rid of the virus. While this natural response develops, the virus can make you quite ill.  CAUSES  Many different viruses can cause gastroenteritis, such as rotavirus or noroviruses. You can catch one of these viruses by consuming contaminated food or water. You may also catch a virus by sharing utensils or other personal items with an infected person or by touching a contaminated surface.  SYMPTOMS  The most common symptoms are diarrhea and vomiting. These problems can cause a severe loss of body fluids (dehydration) and a body salt (electrolyte) imbalance. Other symptoms may include:  Fever.  Headache.  Fatigue.  Abdominal pain.   DIAGNOSIS  Your caregiver can usually diagnose viral gastroenteritis based on your symptoms and a physical exam. A stool sample may also be taken to test for the presence of viruses or other infections.  TREATMENT  This illness typically goes away on its own.  Treatments are aimed at rehydration. The most serious cases of viral gastroenteritis involve vomiting so severely that you are not able to keep fluids down. In these cases, fluids must be given through an intravenous line (IV).  HOME CARE INSTRUCTIONS   Drink enough fluids to keep your urine clear or pale yellow. Drink small amounts of fluids frequently and increase the amounts as tolerated.  Ask your caregiver for specific rehydration instructions.  Avoid:  Foods high in sugar.  Alcohol.  Carbonated drinks.  Tobacco.  Juice.  Caffeine drinks.  Extremely hot or cold fluids.  Fatty, greasy foods.  Too much intake of anything at one time.  Dairy products until 24 to 48 hours after diarrhea stops.  You may consume probiotics. Probiotics are active cultures of beneficial bacteria. They may lessen the amount and number of diarrheal stools in adults. Probiotics can be found in yogurt with active cultures and in supplements.  Wash your hands well to avoid spreading the virus.  Only take over-the-counter or prescription medicines for pain, discomfort, or fever as directed by your caregiver. Do not give aspirin to children. Antidiarrheal medicines are not recommended.  Ask your caregiver if you should continue to take your regular prescribed and over-the-counter medicines.  Keep all follow-up appointments as directed by your caregiver.   SEEK IMMEDIATE MEDICAL CARE IF:   You are unable to keep fluids down.  You do not urinate at least once every 6 to 8 hours.  You develop shortness of breath.  You notice blood in  your stool or vomit. This may look like coffee grounds.  You have abdominal pain that increases or is concentrated in one small area (localized).  You have persistent vomiting or diarrhea.  You have a fever.  The patient is a child younger than 3 months, and he or she has a fever.  The patient is a child older than 3 months, and he or she has a fever and  persistent symptoms.  The patient is a child older than 3 months, and he or she has a fever and symptoms suddenly get worse.  The patient is a baby, and he or she has no tears when crying.   MAKE SURE YOU:   Understand these instructions.  Will watch your condition.  Will get help right away if you are not doing well or get worse. Document Released: 08/30/2005 Document Revised: 11/22/2011 Document Reviewed: 06/16/2011 [facility] Patient Information 2014 [place].

## 2013-11-04 LAB — STOOL CULTURE

## 2013-11-05 ENCOUNTER — Other Ambulatory Visit (INDEPENDENT_AMBULATORY_CARE_PROVIDER_SITE_OTHER): Payer: Medicare Other

## 2013-11-05 DIAGNOSIS — R739 Hyperglycemia, unspecified: Secondary | ICD-10-CM

## 2013-11-05 DIAGNOSIS — R7309 Other abnormal glucose: Secondary | ICD-10-CM

## 2013-11-05 DIAGNOSIS — I10 Essential (primary) hypertension: Secondary | ICD-10-CM

## 2013-11-05 LAB — COMPREHENSIVE METABOLIC PANEL
ALBUMIN: 3.5 g/dL (ref 3.5–5.2)
ALT: 45 U/L — ABNORMAL HIGH (ref 0–35)
AST: 35 U/L (ref 0–37)
Alkaline Phosphatase: 75 U/L (ref 39–117)
BUN: 13 mg/dL (ref 6–23)
CHLORIDE: 99 meq/L (ref 96–112)
CO2: 29 mEq/L (ref 19–32)
Calcium: 9.3 mg/dL (ref 8.4–10.5)
Creatinine, Ser: 0.9 mg/dL (ref 0.4–1.2)
GFR: 65.83 mL/min (ref >60.00)
GLUCOSE: 102 mg/dL — AB (ref 70–99)
POTASSIUM: 3.7 meq/L (ref 3.5–5.1)
Sodium: 138 mEq/L (ref 135–145)
TOTAL PROTEIN: 7.2 g/dL (ref 6.0–8.3)
Total Bilirubin: 0.4 mg/dL (ref 0.3–1.2)

## 2013-11-05 LAB — LIPID PANEL
Cholesterol: 137 mg/dL (ref 0–200)
HDL: 46.7 mg/dL (ref >39.00)
LDL Cholesterol: 64 mg/dL (ref 0–99)
Total CHOL/HDL Ratio: 3
Triglycerides: 132 mg/dL (ref 0.0–149.0)
VLDL: 26.4 mg/dL (ref 0.0–40.0)

## 2013-11-05 LAB — HEMOGLOBIN A1C: HEMOGLOBIN A1C: 6.6 % — AB (ref 4.6–6.5)

## 2013-11-07 ENCOUNTER — Encounter: Payer: Self-pay | Admitting: Family Medicine

## 2013-11-07 ENCOUNTER — Ambulatory Visit (INDEPENDENT_AMBULATORY_CARE_PROVIDER_SITE_OTHER): Payer: Medicare Other | Admitting: Family Medicine

## 2013-11-07 VITALS — BP 128/72 | HR 89 | Temp 97.9°F | Ht 60.25 in | Wt 158.5 lb

## 2013-11-07 DIAGNOSIS — E669 Obesity, unspecified: Secondary | ICD-10-CM

## 2013-11-07 DIAGNOSIS — E119 Type 2 diabetes mellitus without complications: Secondary | ICD-10-CM

## 2013-11-07 DIAGNOSIS — A084 Viral intestinal infection, unspecified: Secondary | ICD-10-CM | POA: Insufficient documentation

## 2013-11-07 DIAGNOSIS — I10 Essential (primary) hypertension: Secondary | ICD-10-CM

## 2013-11-07 DIAGNOSIS — A088 Other specified intestinal infections: Secondary | ICD-10-CM

## 2013-11-07 MED ORDER — HYDROCORTISONE VALERATE 0.2 % EX CREA: 1.0000 "application " | TOPICAL_CREAM | Freq: Every day | CUTANEOUS | Status: DC

## 2013-11-07 MED ORDER — DILTIAZEM HCL ER COATED BEADS 240 MG PO CP24
240.0000 mg | ORAL_CAPSULE | Freq: Every day | ORAL | Status: DC
Start: 2013-11-07 — End: 2014-10-23

## 2013-11-07 NOTE — Patient Instructions (Signed)
Keep up the good work with diet and exercise  I'm glad you are feeling better  Make sure you are drinking a enough fluids - if nausea or other symptoms come back - please let me know   Follow up for annual exam in 6 months with labs prior

## 2013-11-07 NOTE — Progress Notes (Signed)
Subjective:    Patient ID: [name], female    DOB: [birth year], 80 y.o.   MRN: [id number]  HPI Here for hospital f/u and for chronic health problems   Wt is up 6 lb from hosp with bmi of 73  Was hosp 2/18 to 11/02/13 for acute gastroenteritis  Hydration and supportive care given  She had n/v/d severe that is better now  Got her energy level back  Eating and drinking -back to normal   She thinks she caught it from ER when she took brother to ER a week earlier   Results for orders placed in visit on 11/05/13  COMPREHENSIVE METABOLIC PANEL      Result Value Ref Range   Sodium 138  135 - 145 mEq/L   Potassium 3.7  3.5 - 5.1 mEq/L   Chloride 99  96 - 112 mEq/L   CO2 29  19 - 32 mEq/L   Glucose, Bld 102 (*) 70 - 99 mg/dL   BUN 13  6 - 23 mg/dL   Creatinine, Ser 0.9  0.4 - 1.2 mg/dL   Total Bilirubin 0.4  0.3 - 1.2 mg/dL   Alkaline Phosphatase 75  39 - 117 U/L   AST 35  0 - 37 U/L   ALT 45 (*) 0 - 35 U/L   Total Protein 7.2  6.0 - 8.3 g/dL   Albumin 3.5  3.5 - 5.2 g/dL   Calcium 9.3  8.4 - 10.5 mg/dL   GFR 65.83  >60.00 mL/min  HEMOGLOBIN A1C      Result Value Ref Range   Hemoglobin A1C 6.6 (*) 4.6 - 6.5 %  LIPID PANEL      Result Value Ref Range   Cholesterol 137  0 - 200 mg/dL   Triglycerides 132.0  0.0 - 149.0 mg/dL   HDL 46.70  >39.00 mg/dL   VLDL 26.4  0.0 - 40.0 mg/dL   LDL Cholesterol 64  0 - 99 mg/dL   Total CHOL/HDL Ratio 3       Lab Results  Component Value Date   WBC 6.1 11/01/2013   HGB 13.0 11/01/2013   HCT 38.6 11/01/2013   MCV 93.2 11/01/2013   PLT 226 11/01/2013    Diabetes Home sugar results  DM diet -she is trying to do better -went to DM ed [facility]- learned a lot - has f/u in 2 weeks  Exercise - she works on at least 15 minutes of walking in the house daily Symptoms- none  A1C last  Lab Results  Component Value Date   HGBA1C 6.6* 11/05/2013  this is up from 6.2   No problems with medications  Renal protection Last eye exam was 1/15-  all was ok    bp is stable today  No cp or palpitations or headaches or edema  No side effects to medicines  BP Readings from Last 3 Encounters:  11/07/13 128/72  11/02/13 117/46  08/06/13 132/68        Patient Active Problem List   Diagnosis Date Noted  . Gastroenteritis 10/31/2013  . Encounter for Medicare annual wellness exam 01/31/2013  . Colon cancer screening 01/31/2013  . Constipation 09/28/2012  . Dyspnea 01/17/2012  . Anemia 01/05/2012  . Chronic cough 12/27/2011  . 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 Pedal edema 12/22/2011  . B12 deficiency 12/22/2011  . Chest pain 11/21/2011  . Hyponatremia 11/21/2011  . Hypokalemia 11/21/2011  . Obesity 11/10/2011  . Diabetes type 2, controlled 05/11/2010  .  Pure hypercholesterolemia 11/03/2009  . BACK PAIN 10/14/2009  . MICROSCOPIC HEMATURIA 12/06/2008  . URINARY INCONTINENCE, MIXED 12/06/2008  . ALLERGIC RHINITIS 04/22/2008  . OSTEOPOROSIS 04/22/2008  . CHOLELITHIASIS 09/06/2007  . RESTLESS LEG SYNDROME 05/05/2007  . KERATOSIS, [place] NEC 05/05/2007  . OSTEOARTHRITIS 12/15/2006  . BREAST CANCER, HX OF 12/11/2006  . HYPERTENSION 11/29/2006  . GERD 11/29/2006   Past Medical History  Diagnosis Date  . HX: breast cancer 12/99  . HTN (hypertension)   . GERD (gastroesophageal reflux disease)   . Hyperglycemia   . Allergic rhinitis   . Incontinence   . Chronic cough   . Compression fracture   . Diverticulosis   . Hemorrhoids   . Personal history of skin cancer     Basal cell  . Osteoporosis     fosamax in past/ followed by Dr Lorin Mercy   . Endometrial cancer   . Uterine cancer   . Arthritis   . Hematuria   . Diabetes mellitus     borderline   Past Surgical History  Procedure Laterality Date  . Total abdominal hysterectomy w/ bilateral salpingoophorectomy      endometrial cancer  . Mastectomy      Left  . Back surgery  2007    Dr. Lorin Mercy -disk  . Skin cancer excision  10/08    basal cell  .  Cholecystectomy  2009  . Cataract extraction  7/10  . Dexa  2/00    LS osteoporsis  . Dexa  2/02    same; OP, LS, spine, hip  . Dexa  3/04    BMD spine; low BMD hip  . Colonoscopy  3/02    Diverticulosis; hemorrhoids  . Abdominal hysterectomy      TAH BSO  . Dilation and curettage of uterus    . Shoulder surgery    . Carpal tunnel release     History  Substance Use Topics  . Smoking status: Never Smoker   . Smokeless tobacco: Never Used  . Alcohol Use: No   Family History  Problem Relation Age of Onset  . Colon cancer Brother   . Breast cancer Sister   . Breast cancer Other    Allergies  Allergen Reactions  . Amoxicillin-Pot Clavulanate Nausea Only  . Clarithromycin Nausea Only  . Codeine Nausea Only  . Sulfonamide Derivatives Nausea Only  . Tetracycline Nausea Only   Current Outpatient Prescriptions on File Prior to Visit  Medication Sig Dispense Refill  . acetaminophen (TYLENOL) 650 MG CR tablet Take 1,300 mg by mouth 2 (two) times daily.        Marland Kitchen aspirin EC 81 MG tablet Take 81 mg by mouth daily.      . calcium carbonate (TUMS - DOSED IN MG ELEMENTAL CALCIUM) 500 MG chewable tablet Chew 3 tablets by mouth daily.       . chlorpheniramine (CHLOR-TRIMETON) 4 MG tablet Take 4 mg by mouth 2 (two) times daily as needed for allergies.      Marland Kitchen diltiazem (CARDIZEM CD) 240 MG 24 hr capsule Take 240 mg by mouth daily.      Marland Kitchen docusate sodium (COLACE) 100 MG capsule Take 100 mg by mouth 2 (two) times daily.        . hydrocortisone valerate cream (WESTCORT) 0.2 % Apply 1 application topically daily.      . Multiple Vitamin (MULITIVITAMIN WITH MINERALS) TABS Take 1 tablet by mouth daily.      Marland Kitchen omeprazole (PRILOSEC) 40 MG capsule Take  1 capsule (40 mg total) by mouth 2 (two) times daily.  180 capsule  3  . ondansetron (ZOFRAN) 4 MG tablet Take 4 mg by mouth every 8 (eight) hours as needed for nausea or vomiting.      Marland Kitchen rOPINIRole (REQUIP) 1 MG tablet Take 1 mg by mouth daily.        Marland Kitchen spironolactone (ALDACTONE) 25 MG tablet Take 25 mg by mouth daily.      . vitamin D, CHOLECALCIFEROL, 400 UNITS tablet Take 800 Units by mouth daily.         No current facility-administered medications on file prior to visit.    Review of Systems Review of Systems  Constitutional: Negative for fever, appetite change, fatigue and unexpected weight change.  Eyes: Negative for pain and visual disturbance.  Respiratory: Negative for cough and shortness of breath.   Cardiovascular: Negative for cp or palpitations    Gastrointestinal: Negative for nausea, diarrhea and constipation.  Genitourinary: Negative for urgency and frequency.  Skin: Negative for pallor or rash   Neurological: Negative for weakness, light-headedness, numbness and headaches.  Hematological: Negative for adenopathy. Does not bruise/bleed easily.  Psychiatric/Behavioral: Negative for dysphoric mood. The patient is not nervous/anxious.         Objective:   Physical Exam  Constitutional: She appears well-developed and well-nourished. No distress.  obese and well appearing   HENT:  Head: Normocephalic and atraumatic.  Mouth/Throat: Oropharynx is clear and moist.  Eyes: Conjunctivae and EOM are normal. Pupils are equal, round, and reactive to light. No scleral icterus.  Neck: Normal range of motion. Neck supple. No JVD present. Carotid bruit is not present. No thyromegaly present.  Cardiovascular: Normal rate, regular rhythm, normal heart sounds and intact distal pulses.  Exam reveals no gallop.   Pulmonary/Chest: Effort normal and breath sounds normal. No respiratory distress. She has no wheezes. She exhibits no tenderness.  Abdominal: Soft. Bowel sounds are normal. She exhibits no distension, no abdominal bruit and no mass. There is no tenderness.  Musculoskeletal: Normal range of motion. She exhibits no edema and no tenderness.  Lymphadenopathy:    She has no cervical adenopathy.  Neurological: She is alert. She  has normal reflexes. No cranial nerve deficit. She exhibits normal muscle tone. Coordination normal.  Skin: Skin is warm and dry. No rash noted. No erythema. No pallor.  Psychiatric: She has a normal mood and affect.          Assessment & Plan:

## 2013-11-07 NOTE — Progress Notes (Signed)
Pre visit review using our clinic review tool, if applicable. No additional management support is needed unless otherwise documented below in the visit note. 

## 2013-11-08 NOTE — Assessment & Plan Note (Signed)
Rev hosp notes and studies in detail  Pt is much improved after viral resolution and supportive care Will update if any symptoms return Disc imp of hydration

## 2013-11-08 NOTE — Assessment & Plan Note (Signed)
Lab Results  Component Value Date   HGBA1C 6.6* 11/05/2013    This is fairly controlled Pt did benefit from DM teaching Disc imp of diet/ exercise and wt loss F/u 6 mo

## 2013-11-08 NOTE — Assessment & Plan Note (Signed)
Discussed how this problem influences overall health and the risks it imposes  Reviewed plan for weight loss with lower calorie diet (via better food choices and also portion control or program like weight watchers) and exercise building up to or more than 30 minutes 5 days per week including some aerobic activity    

## 2013-11-08 NOTE — Assessment & Plan Note (Signed)
BP: 128/72 mmHg  bp in fair control at this time  No changes needed Disc lifstyle change with low sodium diet and exercise   Lab rev

## 2013-11-09 ENCOUNTER — Telehealth: Payer: Self-pay | Admitting: Family Medicine

## 2013-11-09 NOTE — Telephone Encounter (Signed)
Relevant patient education mailed to patient.  

## 2013-11-23 ENCOUNTER — Ambulatory Visit: Payer: Medicare Other | Admitting: Family Medicine

## 2013-12-04 ENCOUNTER — Ambulatory Visit (INDEPENDENT_AMBULATORY_CARE_PROVIDER_SITE_OTHER): Payer: Medicare Other | Admitting: Family Medicine

## 2013-12-04 ENCOUNTER — Encounter: Payer: Self-pay | Admitting: Family Medicine

## 2013-12-04 VITALS — BP 142/68 | HR 92 | Temp 98.5°F | Ht 60.25 in | Wt 156.2 lb

## 2013-12-04 DIAGNOSIS — B029 Zoster without complications: Secondary | ICD-10-CM

## 2013-12-04 MED ORDER — TRAMADOL HCL 50 MG PO TABS: 50.0000 mg | ORAL_TABLET | Freq: Every evening | ORAL | Status: DC | PRN

## 2013-12-04 NOTE — Patient Instructions (Signed)
You have a mild case of shingles -that is causing your pain (even in the areas without rash)  Keep it clean and dry- it should continue to improve Take the tramadol with caution at night to help night time pain If symptoms worsen please let me know     Shingles Shingles (herpes zoster) is an infection that is caused by the same virus that causes chickenpox (varicella). The infection causes a painful skin rash and fluid-filled blisters, which eventually break open, crust over, and heal. It may occur in any area of the body, but it usually affects only one side of the body or face. The pain of shingles usually lasts about 1 month. However, some people with shingles may develop long-term (chronic) pain in the affected area of the body. Shingles often occurs many years after the person had chickenpox. It is more common:  In people older than 50 years.  In people with weakened immune systems, such as those with HIV, AIDS, or cancer.  In people taking medicines that weaken the immune system, such as transplant medicines.  In people under great stress. CAUSES  Shingles is caused by the varicella zoster virus (VZV), which also causes chickenpox. After a person is infected with the virus, it can remain in the person's body for years in an inactive state (dormant). To cause shingles, the virus reactivates and breaks out as an infection in a nerve root. The virus can be spread from person to person (contagious) through contact with open blisters of the shingles rash. It will only spread to people who have not had chickenpox. When these people are exposed to the virus, they may develop chickenpox. They will not develop shingles. Once the blisters scab over, the person is no longer contagious and cannot spread the virus to others. SYMPTOMS  Shingles shows up in stages. The initial symptoms may be pain, itching, and tingling in an area of the skin. This pain is usually described as burning, stabbing, or  throbbing.In a few days or weeks, a painful red rash will appear in the area where the pain, itching, and tingling were felt. The rash is usually on one side of the body in a band or belt-like pattern. Then, the rash usually turns into fluid-filled blisters. They will scab over and dry up in approximately 2 3 weeks. Flu-like symptoms may also occur with the initial symptoms, the rash, or the blisters. These may include:  Fever.  Chills.  Headache.  Upset stomach. DIAGNOSIS  Your caregiver will perform a skin exam to diagnose shingles. Skin scrapings or fluid samples may also be taken from the blisters. This sample will be examined under a microscope or sent to a lab for further testing. TREATMENT  There is no specific cure for shingles. Your caregiver will likely prescribe medicines to help you manage the pain, recover faster, and avoid long-term problems. This may include antiviral drugs, anti-inflammatory drugs, and pain medicines. HOME CARE INSTRUCTIONS   Take a cool bath or apply cool compresses to the area of the rash or blisters as directed. This may help with the pain and itching.   Only take over-the-counter or prescription medicines as directed by your caregiver.   Rest as directed by your caregiver.  Keep your rash and blisters clean with mild soap and cool water or as directed by your caregiver.  Do not pick your blisters or scratch your rash. Apply an anti-itch cream or numbing creams to the affected area as directed by your caregiver.  Keep your shingles rash covered with a loose bandage (dressing).  Avoid skin contact with:  Babies.   Pregnant women.   Children with eczema.   Elderly people with transplants.   People with chronic illnesses, such as leukemia or AIDS.   Wear loose-fitting clothing to help ease the pain of material rubbing against the rash.  Keep all follow-up appointments with your caregiver.If the area involved is on your face, you may  receive a referral for follow-up to a specialist, such as an eye doctor (ophthalmologist) or an ear, nose, and throat (ENT) doctor. Keeping all follow-up appointments will help you avoid eye complications, chronic pain, or disability.  SEEK IMMEDIATE MEDICAL CARE IF:   You have facial pain, pain around the eye area, or loss of feeling on one side of your face.  You have ear pain or ringing in your ear.  You have loss of taste.  Your pain is not relieved with prescribed medicines.   Your redness or swelling spreads.   You have more pain and swelling.  Your condition is worsening or has changed.   You have a feveror persistent symptoms for more than 2 3 days.  You have a fever and your symptoms suddenly get worse. MAKE SURE YOU:  Understand these instructions.  Will watch your condition.  Will get help right away if you are not doing well or get worse. Document Released: 08/30/2005 Document Revised: 05/24/2012 Document Reviewed: 04/13/2012 [facility] Patient Information 2014 [place].

## 2013-12-04 NOTE — Progress Notes (Signed)
Subjective:    Patient ID: [name], female    DOB: 08/25/1927, 80 y.o.   MRN: [id number]  HPI Here right sided pain and a rash  Sore across her R breast/ abdomen  Rash is under her breast    She had a shingles vaccine  No fever  No upper resp symptoms  Patient Active Problem List   Diagnosis Date Noted  . Viral gastroenteritis 11/07/2013  . Gastroenteritis 10/31/2013  . Encounter for Medicare annual wellness exam 01/31/2013  . Colon cancer screening 01/31/2013  . Constipation 09/28/2012  . Dyspnea 01/17/2012  . Anemia 01/05/2012  . Chronic cough 12/27/2011  . 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 Pedal edema 12/22/2011  . B12 deficiency 12/22/2011  . Chest pain 11/21/2011  . Obesity 11/10/2011  . Diabetes type 2, controlled 05/11/2010  . Pure hypercholesterolemia 11/03/2009  . BACK PAIN 10/14/2009  . MICROSCOPIC HEMATURIA 12/06/2008  . URINARY INCONTINENCE, MIXED 12/06/2008  . ALLERGIC RHINITIS 04/22/2008  . OSTEOPOROSIS 04/22/2008  . CHOLELITHIASIS 09/06/2007  . RESTLESS LEG SYNDROME 05/05/2007  . KERATOSIS, [place] NEC 05/05/2007  . OSTEOARTHRITIS 12/15/2006  . BREAST CANCER, HX OF 12/11/2006  . HYPERTENSION 11/29/2006  . GERD 11/29/2006   Past Medical History  Diagnosis Date  . HX: breast cancer 12/99  . HTN (hypertension)   . GERD (gastroesophageal reflux disease)   . Hyperglycemia   . Allergic rhinitis   . Incontinence   . Chronic cough   . Compression fracture   . Diverticulosis   . Hemorrhoids   . Personal history of skin cancer     Basal cell  . Osteoporosis     fosamax in past/ followed by Dr Lorin Mercy   . Endometrial cancer   . Uterine cancer   . Arthritis   . Hematuria   . Diabetes mellitus     borderline   Past Surgical History  Procedure Laterality Date  . Total abdominal hysterectomy w/ bilateral salpingoophorectomy      endometrial cancer  . Mastectomy      Left  . Back surgery  2007    Dr. Lorin Mercy -disk  . Skin cancer  excision  10/08    basal cell  . Cholecystectomy  2009  . Cataract extraction  7/10  . Dexa  2/00    LS osteoporsis  . Dexa  2/02    same; OP, LS, spine, hip  . Dexa  3/04    BMD spine; low BMD hip  . Colonoscopy  3/02    Diverticulosis; hemorrhoids  . Abdominal hysterectomy      TAH BSO  . Dilation and curettage of uterus    . Shoulder surgery    . Carpal tunnel release     History  Substance Use Topics  . Smoking status: Never Smoker   . Smokeless tobacco: Never Used  . Alcohol Use: No   Family History  Problem Relation Age of Onset  . Colon cancer Brother   . Breast cancer Sister   . Breast cancer Other    Allergies  Allergen Reactions  . Amoxicillin-Pot Clavulanate Nausea Only  . Clarithromycin Nausea Only  . Codeine Nausea Only  . Sulfonamide Derivatives Nausea Only  . Tetracycline Nausea Only   Current Outpatient Prescriptions on File Prior to Visit  Medication Sig Dispense Refill  . acetaminophen (TYLENOL) 650 MG CR tablet Take 1,300 mg by mouth 2 (two) times daily.        Marland Kitchen aspirin EC 81 MG tablet Take 81  mg by mouth daily.      . calcium carbonate (TUMS - DOSED IN MG ELEMENTAL CALCIUM) 500 MG chewable tablet Chew 3 tablets by mouth daily.       . chlorpheniramine (CHLOR-TRIMETON) 4 MG tablet Take 4 mg by mouth 2 (two) times daily as needed for allergies.      Marland Kitchen diltiazem (CARDIZEM CD) 240 MG 24 hr capsule Take 1 capsule (240 mg total) by mouth daily.  90 capsule  3  . docusate sodium (COLACE) 100 MG capsule Take 100 mg by mouth 2 (two) times daily.        . hydrocortisone valerate cream (WESTCORT) 0.2 % Apply 1 application topically daily.  45 g  1  . Multiple Vitamin (MULITIVITAMIN WITH MINERALS) TABS Take 1 tablet by mouth daily.      Marland Kitchen omeprazole (PRILOSEC) 40 MG capsule Take 1 capsule (40 mg total) by mouth 2 (two) times daily.  180 capsule  3  . ondansetron (ZOFRAN) 4 MG tablet Take 4 mg by mouth every 8 (eight) hours as needed for nausea or vomiting.       Marland Kitchen rOPINIRole (REQUIP) 1 MG tablet Take 1 mg by mouth daily.      Marland Kitchen spironolactone (ALDACTONE) 25 MG tablet Take 25 mg by mouth daily.      . vitamin D, CHOLECALCIFEROL, 400 UNITS tablet Take 800 Units by mouth daily.         No current facility-administered medications on file prior to visit.      Review of Systems Review of Systems  Constitutional: Negative for fever, appetite change, fatigue and unexpected weight change.  Eyes: Negative for pain and visual disturbance.  Respiratory: Negative for cough and shortness of breath.   Cardiovascular: Negative for palpitations    Gastrointestinal: Negative for nausea, diarrhea and constipation.  Genitourinary: Negative for urgency and frequency.  Skin: Negative for pallor and   pos for pain of skin and deep ache over area of rash on R side  Neurological: Negative for weakness, light-headedness, numbness and headaches.  Hematological: Negative for adenopathy. Does not bruise/bleed easily.  Psychiatric/Behavioral: Negative for dysphoric mood. The patient is not nervous/anxious.         Objective:   Physical Exam  Constitutional: She appears well-developed and well-nourished. No distress.  obese and well appearing   HENT:  Head: Normocephalic and atraumatic.  Mouth/Throat: Oropharynx is clear and moist.  Eyes: Conjunctivae and EOM are normal. Pupils are equal, round, and reactive to light.  Neck: Normal range of motion. Neck supple.  Cardiovascular: Regular rhythm.   Pulmonary/Chest: Effort normal and breath sounds normal. She exhibits tenderness.  Tender over R torso and breast   Abdominal: Soft. Bowel sounds are normal.  Genitourinary: There is breast tenderness. No breast swelling, discharge or bleeding.  Tender over R breast above and over area of rash  Musculoskeletal: She exhibits tenderness.  Lymphadenopathy:    She has no cervical adenopathy.  Neurological: She is alert. She has normal reflexes.  Skin: Skin is warm and  dry. Rash noted. There is erythema.  Erythematous rash over T4-T6 dermatomes on the R that resembles zoster- dried vesicles -some tenderness   Psychiatric: She has a normal mood and affect.          Assessment & Plan:

## 2013-12-04 NOTE — Progress Notes (Signed)
Pre visit review using our clinic review tool, if applicable. No additional management support is needed unless otherwise documented below in the visit note. 

## 2013-12-05 ENCOUNTER — Other Ambulatory Visit: Payer: Self-pay | Admitting: Family Medicine

## 2013-12-05 NOTE — Assessment & Plan Note (Signed)
In pt prev immunized  About a week into rash- out of window for antivirals  Pain is not severe-will tx with tramadol at night - and she will alert me if worse  Disc rash care Handout given

## 2014-04-23 ENCOUNTER — Other Ambulatory Visit: Payer: Self-pay | Admitting: *Deleted

## 2014-04-23 MED ORDER — ROPINIROLE HCL 1 MG PO TABS: 1.0000 mg | ORAL_TABLET | Freq: Every day | ORAL | Status: DC

## 2014-04-23 NOTE — Telephone Encounter (Signed)
Fax refill request, please advise  

## 2014-04-23 NOTE — Telephone Encounter (Signed)
Please refill times 5 

## 2014-04-23 NOTE — Telephone Encounter (Signed)
done

## 2014-05-12 ENCOUNTER — Telehealth: Payer: Self-pay | Admitting: Family Medicine

## 2014-05-12 DIAGNOSIS — I10 Essential (primary) hypertension: Secondary | ICD-10-CM

## 2014-05-12 DIAGNOSIS — E7800 Pure hypercholesterolemia, unspecified: Secondary | ICD-10-CM

## 2014-05-12 DIAGNOSIS — M810 Age-related osteoporosis without current pathological fracture: Secondary | ICD-10-CM

## 2014-05-12 DIAGNOSIS — E119 Type 2 diabetes mellitus without complications: Secondary | ICD-10-CM

## 2014-05-12 DIAGNOSIS — E538 Deficiency of other specified B group vitamins: Secondary | ICD-10-CM

## 2014-05-12 NOTE — Telephone Encounter (Signed)
Message copied by Abner Greenspan on Sun May 12, 2014 10:19 AM ------      Message from: Ellamae Sia      Created: Wed May 08, 2014 11:41 AM      Regarding: Lab orders for Monday, 8.31.15       Patient is scheduled for CPX labs, please order future labs, Thanks , Terri       ------

## 2014-05-13 ENCOUNTER — Other Ambulatory Visit: Payer: Medicare Other

## 2014-05-14 ENCOUNTER — Telehealth: Payer: Self-pay | Admitting: Family Medicine

## 2014-05-14 DIAGNOSIS — E119 Type 2 diabetes mellitus without complications: Secondary | ICD-10-CM

## 2014-05-14 DIAGNOSIS — E538 Deficiency of other specified B group vitamins: Secondary | ICD-10-CM

## 2014-05-14 DIAGNOSIS — M810 Age-related osteoporosis without current pathological fracture: Secondary | ICD-10-CM

## 2014-05-14 DIAGNOSIS — E7800 Pure hypercholesterolemia, unspecified: Secondary | ICD-10-CM

## 2014-05-14 DIAGNOSIS — I10 Essential (primary) hypertension: Secondary | ICD-10-CM

## 2014-05-14 NOTE — Telephone Encounter (Signed)
Message copied by Abner Greenspan on Tue May 14, 2014  9:05 PM ------      Message from: Ellamae Sia      Created: Fri May 10, 2014  9:16 AM      Regarding: Lab orders for 9.2.15       Patient is scheduled for CPX labs, please order future labs, Thanks , Terri       ------

## 2014-05-15 ENCOUNTER — Other Ambulatory Visit (INDEPENDENT_AMBULATORY_CARE_PROVIDER_SITE_OTHER): Payer: Medicare Other

## 2014-05-15 DIAGNOSIS — I10 Essential (primary) hypertension: Secondary | ICD-10-CM

## 2014-05-15 DIAGNOSIS — E538 Deficiency of other specified B group vitamins: Secondary | ICD-10-CM

## 2014-05-15 DIAGNOSIS — E7800 Pure hypercholesterolemia, unspecified: Secondary | ICD-10-CM

## 2014-05-15 DIAGNOSIS — D508 Other iron deficiency anemias: Secondary | ICD-10-CM

## 2014-05-15 DIAGNOSIS — Z0000 Encounter for general adult medical examination without abnormal findings: Secondary | ICD-10-CM

## 2014-05-15 DIAGNOSIS — E119 Type 2 diabetes mellitus without complications: Secondary | ICD-10-CM

## 2014-05-15 DIAGNOSIS — M810 Age-related osteoporosis without current pathological fracture: Secondary | ICD-10-CM

## 2014-05-15 LAB — CBC WITH DIFFERENTIAL/PLATELET
BASOS PCT: 0.2 % (ref 0.0–3.0)
Basophils Absolute: 0 10*3/uL (ref 0.0–0.1)
Eosinophils Absolute: 0.2 10*3/uL (ref 0.0–0.7)
Eosinophils Relative: 2.5 % (ref 0.0–5.0)
HEMATOCRIT: 42.3 % (ref 36.0–46.0)
Hemoglobin: 14 g/dL (ref 12.0–15.0)
Lymphocytes Relative: 17.5 % (ref 12.0–46.0)
Lymphs Abs: 1.4 10*3/uL (ref 0.7–4.0)
MCHC: 33.1 g/dL (ref 30.0–36.0)
MCV: 95.3 fl (ref 78.0–100.0)
MONO ABS: 0.9 10*3/uL (ref 0.1–1.0)
MONOS PCT: 11.8 % (ref 3.0–12.0)
Neutro Abs: 5.2 10*3/uL (ref 1.4–7.7)
Neutrophils Relative %: 68 % (ref 43.0–77.0)
Platelets: 314 10*3/uL (ref 150.0–400.0)
RBC: 4.44 Mil/uL (ref 3.87–5.11)
RDW: 13.7 % (ref 11.5–15.5)
WBC: 7.7 10*3/uL (ref 4.0–10.5)

## 2014-05-15 LAB — COMPREHENSIVE METABOLIC PANEL
ALBUMIN: 4.1 g/dL (ref 3.5–5.2)
ALK PHOS: 50 U/L (ref 39–117)
ALT: 16 U/L (ref 0–35)
AST: 25 U/L (ref 0–37)
BUN: 20 mg/dL (ref 6–23)
CALCIUM: 9.6 mg/dL (ref 8.4–10.5)
CO2: 29 meq/L (ref 19–32)
Chloride: 100 mEq/L (ref 96–112)
Creatinine, Ser: 1 mg/dL (ref 0.4–1.2)
GFR: 55.99 mL/min — AB (ref >60.00)
Glucose, Bld: 93 mg/dL (ref 70–99)
POTASSIUM: 4.5 meq/L (ref 3.5–5.1)
Sodium: 137 mEq/L (ref 135–145)
Total Bilirubin: 0.6 mg/dL (ref 0.2–1.2)
Total Protein: 7.9 g/dL (ref 6.0–8.3)

## 2014-05-15 LAB — VITAMIN B12: VITAMIN B 12: 420 pg/mL (ref 211–911)

## 2014-05-15 LAB — LIPID PANEL
CHOLESTEROL: 173 mg/dL (ref 0–200)
HDL: 56.9 mg/dL (ref >39.00)
LDL CALC: 88 mg/dL (ref 0–99)
NonHDL: 116.1
Total CHOL/HDL Ratio: 3
Triglycerides: 139 mg/dL (ref 0.0–149.0)
VLDL: 27.8 mg/dL (ref 0.0–40.0)

## 2014-05-15 LAB — TSH: TSH: 1.54 u[IU]/mL (ref 0.35–4.50)

## 2014-05-15 LAB — HEMOGLOBIN A1C: HEMOGLOBIN A1C: 6.2 % (ref 4.6–6.5)

## 2014-05-15 LAB — VITAMIN D 25 HYDROXY (VIT D DEFICIENCY, FRACTURES): VITD: 39.14 ng/mL (ref 30.00–100.00)

## 2014-05-17 ENCOUNTER — Encounter: Payer: Self-pay | Admitting: Family Medicine

## 2014-05-17 ENCOUNTER — Ambulatory Visit (INDEPENDENT_AMBULATORY_CARE_PROVIDER_SITE_OTHER): Payer: Medicare Other | Admitting: Family Medicine

## 2014-05-17 VITALS — BP 136/66 | HR 99 | Temp 99.1°F | Ht 60.0 in | Wt 157.0 lb

## 2014-05-17 DIAGNOSIS — E119 Type 2 diabetes mellitus without complications: Secondary | ICD-10-CM

## 2014-05-17 DIAGNOSIS — I10 Essential (primary) hypertension: Secondary | ICD-10-CM

## 2014-05-17 DIAGNOSIS — E669 Obesity, unspecified: Secondary | ICD-10-CM

## 2014-05-17 DIAGNOSIS — Z0000 Encounter for general adult medical examination without abnormal findings: Secondary | ICD-10-CM

## 2014-05-17 DIAGNOSIS — E7800 Pure hypercholesterolemia, unspecified: Secondary | ICD-10-CM

## 2014-05-17 DIAGNOSIS — E538 Deficiency of other specified B group vitamins: Secondary | ICD-10-CM

## 2014-05-17 DIAGNOSIS — M810 Age-related osteoporosis without current pathological fracture: Secondary | ICD-10-CM

## 2014-05-17 DIAGNOSIS — Z1211 Encounter for screening for malignant neoplasm of colon: Secondary | ICD-10-CM

## 2014-05-17 DIAGNOSIS — Z23 Encounter for immunization: Secondary | ICD-10-CM

## 2014-05-17 NOTE — Assessment & Plan Note (Signed)
Lab Results  Component Value Date   HGBA1C 6.2 05/15/2014   Doing very well with DM Good diet  No medicines

## 2014-05-17 NOTE — Assessment & Plan Note (Signed)
IFOB card given  Would avoid colonosc unless necessary due to age

## 2014-05-17 NOTE — Assessment & Plan Note (Signed)
Discussed how this problem influences overall health and the risks it imposes  Reviewed plan for weight loss with lower calorie diet (via better food choices and also portion control or program like weight watchers) and exercise building up to or more than 30 minutes 5 days per week including some aerobic activity   She aims to be more active

## 2014-05-17 NOTE — Assessment & Plan Note (Signed)
bp in fair control at this time  BP Readings from Last 1 Encounters:  05/17/14 136/66   No changes needed Disc lifstyle change with low sodium diet and exercise  Labs reviewed  No change in tx  Wt loss enc

## 2014-05-17 NOTE — Progress Notes (Signed)
Subjective:    Patient ID: [name], female    DOB: Mar 30, 1927, 80 y.o.   MRN: [id number]  HPI I have personally reviewed the Medicare Annual Wellness questionnaire and have noted 1. The patient's medical and social history 2. Their use of alcohol, tobacco or illicit drugs 3. Their current medications and supplements 4. The patient's functional ability including ADL's, fall risks, home safety risks and hearing or visual             impairment. 5. Diet and physical activities 6. Evidence for depression or mood disorders  The patients weight, height, BMI have been recorded in the chart and visual acuity is per eye clinic.  I have made referrals, counseling and provided education to the patient based review of the above and I have provided the pt with a written personalized care plan for preventive services.  She had a rough summer  Her brother who lived with her - died (he broke a hip and just went downhill)-- 78 years old  Is going through his things - a very big job / her sister is also in the hospital  She lives alone now - sometimes good and sometimes bad  Her sister lives next door    See scanned forms.  Routine anticipatory guidance given to patient.  See health maintenance. Colon cancer screening - at her age does not want to do a colonoscopy , will do ifob  Breast cancer screening- she already scheduled her mammogram - upcoming  Self breast exam- no changes , has L mastectomy  Flu vaccine- got that today Tetanus vaccine 1 /12 Pneumovax 8/10 - will get the prevnar today Zoster vaccine- 3/13   Advance directive - does not have -- will work on that -given a packet to ready  Cognitive function addressed- see scanned forms- and if abnormal then additional documentation follows. -occ forgets a name -and then remembers it later   [place] and [facility] reviewed  Meds, vitals, and allergies reviewed.   ROS: See HPI.  Otherwise negative.    bp is stable today  No cp or  palpitations or headaches or edema  No side effects to medicines  BP Readings from Last 3 Encounters:  05/17/14 136/66  12/04/13 142/68  11/07/13 128/72     Wt is stable bmi of 30  Started back bowling this week Needs to exercise more - she does go up and down a lot of stairs  Eats fairly healthy   Lab Results  Component Value Date   VITAMINB12 420 05/15/2014   still taking B12 otc ? Unsure   She takes vit D and a mvi  D level is 39  Pt takes D -unsure about calcium (3 tums per day)  Osteoporosis  She was - on fosamax a while ago  Dr Lorin Mercy used to oversee it    DM Lab Results  Component Value Date   HGBA1C 6.2 05/15/2014  she has most glucose readings under 130 (am and pm) She should eat less bread (too many tomato sandwhiches)   Hyperlipidemia  Lab Results  Component Value Date   CHOL 173 05/15/2014   CHOL 137 11/05/2013   CHOL 164 01/24/2013   Lab Results  Component Value Date   HDL 56.90 05/15/2014   HDL 46.70 11/05/2013   HDL 60.60 01/24/2013   Lab Results  Component Value Date   LDLCALC 88 05/15/2014   [place] 64 11/05/2013   [place] 83 01/24/2013   Lab Results  Component  Value Date   TRIG 139.0 05/15/2014   TRIG 132.0 11/05/2013   TRIG 104.0 01/24/2013   Lab Results  Component Value Date   CHOLHDL 3 05/15/2014   CHOLHDL 3 11/05/2013   CHOLHDL 3 01/24/2013   No results found for this basename: LDLDIRECT   very good profile  She does watches her diet -does well with that   Was due for drug screen for tramadol-but she seldom to never takes it   Patient Active Problem List   Diagnosis Date Noted  . Shingles 12/04/2013  . Gastroenteritis 10/31/2013  . Encounter for Medicare annual wellness exam 01/31/2013  . Colon cancer screening 01/31/2013  . Constipation 09/28/2012  . Dyspnea 01/17/2012  . Anemia 01/05/2012  . Chronic cough 12/27/2011  . 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 Pedal edema 12/22/2011  . B12 deficiency 12/22/2011  . Chest pain 11/21/2011  .  Obesity 11/10/2011  . Hyperglycemia 05/11/2010  . Pure hypercholesterolemia 11/03/2009  . BACK PAIN 10/14/2009  . MICROSCOPIC HEMATURIA 12/06/2008  . URINARY INCONTINENCE, MIXED 12/06/2008  . ALLERGIC RHINITIS 04/22/2008  . OSTEOPOROSIS 04/22/2008  . CHOLELITHIASIS 09/06/2007  . RESTLESS LEG SYNDROME 05/05/2007  . KERATOSIS, Jansen NEC 05/05/2007  . OSTEOARTHRITIS 12/15/2006  . BREAST CANCER, HX OF 12/11/2006  . HYPERTENSION 11/29/2006  . GERD 11/29/2006   Past Medical History  Diagnosis Date  . HX: breast cancer 12/99  . HTN (hypertension)   . GERD (gastroesophageal reflux disease)   . Hyperglycemia   . Allergic rhinitis   . Incontinence   . Chronic cough   . Compression fracture   . Diverticulosis   . Hemorrhoids   . Personal history of skin cancer     Basal cell  . Osteoporosis     fosamax in past/ followed by Dr Lorin Mercy   . Endometrial cancer   . Uterine cancer   . Arthritis   . Hematuria   . Diabetes mellitus     borderline   Past Surgical History  Procedure Laterality Date  . Total abdominal hysterectomy w/ bilateral salpingoophorectomy      endometrial cancer  . Mastectomy      Left  . Back surgery  2007    Dr. Lorin Mercy -disk  . Skin cancer excision  10/08    basal cell  . Cholecystectomy  2009  . Cataract extraction  7/10  . Dexa  2/00    LS osteoporsis  . Dexa  2/02    same; OP, LS, spine, hip  . Dexa  3/04    BMD spine; low BMD hip  . Colonoscopy  3/02    Diverticulosis; hemorrhoids  . Abdominal hysterectomy      TAH BSO  . Dilation and curettage of uterus    . Shoulder surgery    . Carpal tunnel release     History  Substance Use Topics  . Smoking status: Never Smoker   . Smokeless tobacco: Never Used  . Alcohol Use: No   Family History  Problem Relation Age of Onset  . Colon cancer Brother   . Breast cancer Sister   . Breast cancer Other    Allergies  Allergen Reactions  . Amoxicillin-Pot Clavulanate Nausea Only  .  Clarithromycin Nausea Only  . Codeine Nausea Only  . Sulfonamide Derivatives Nausea Only  . Tetracycline Nausea Only   Current Outpatient Prescriptions on File Prior to Visit  Medication Sig Dispense Refill  . acetaminophen (TYLENOL) 650 MG CR tablet Take 1,300 mg by mouth 2 (two)  times daily.        Marland Kitchen aspirin EC 81 MG tablet Take 81 mg by mouth daily.      Marland Kitchen diltiazem (CARDIZEM CD) 240 MG 24 hr capsule Take 1 capsule (240 mg total) by mouth daily.  90 capsule  3  . docusate sodium (COLACE) 100 MG capsule Take 100 mg by mouth daily as needed.       . Multiple Vitamin (MULITIVITAMIN WITH MINERALS) TABS Take 1 tablet by mouth daily.      Marland Kitchen omeprazole (PRILOSEC) 40 MG capsule Take 1 capsule (40 mg total) by mouth 2 (two) times daily.  180 capsule  3  . rOPINIRole (REQUIP) 1 MG tablet Take 1 tablet (1 mg total) by mouth daily.  90 tablet  1  . spironolactone (ALDACTONE) 25 MG tablet Take 25 mg by mouth daily.      . traMADol (ULTRAM) 50 MG tablet Take 1 tablet (50 mg total) by mouth at bedtime as needed for moderate pain.  30 tablet  0  . ondansetron (ZOFRAN) 4 MG tablet Take 4 mg by mouth every 8 (eight) hours as needed for nausea or vomiting.       No current facility-administered medications on file prior to visit.    Review of Systems Review of Systems  Constitutional: Negative for fever, appetite change, fatigue and unexpected weight change.  Eyes: Negative for pain and visual disturbance.  Respiratory: Negative for cough and shortness of breath.   Cardiovascular: Negative for cp or palpitations    Gastrointestinal: Negative for nausea, diarrhea and constipation.  Genitourinary: Negative for urgency and frequency.  Skin: Negative for pallor or rash   Neurological: Negative for weakness, light-headedness, numbness and headaches.  Hematological: Negative for adenopathy. Does not bruise/bleed easily.  Psychiatric/Behavioral: Negative for dysphoric mood. The patient is not  nervous/anxious.         Objective:   Physical Exam  Constitutional: She appears well-developed and well-nourished. No distress.  obese and well appearing   HENT:  Head: Normocephalic and atraumatic.  Right Ear: External ear normal.  Left Ear: External ear normal.  Mouth/Throat: Oropharynx is clear and moist.  Eyes: Conjunctivae and EOM are normal. Pupils are equal, round, and reactive to light. No scleral icterus.  Neck: Normal range of motion. Neck supple. No JVD present. Carotid bruit is not present. No thyromegaly present.  Cardiovascular: Normal rate, regular rhythm, normal heart sounds and intact distal pulses.  Exam reveals no gallop.   Pulmonary/Chest: Effort normal and breath sounds normal. No respiratory distress. She has no wheezes. She exhibits no tenderness.  Abdominal: Soft. Bowel sounds are normal. She exhibits no distension, no abdominal bruit and no mass. There is no tenderness.  Genitourinary:  R breast:  Breast exam: No mass, nodules, thickening, tenderness, bulging, retraction, inflamation, nipple discharge or skin changes noted.  No axillary or clavicular LA.      L mastectomy noted without changes   Musculoskeletal: Normal range of motion. She exhibits no edema and no tenderness.  Lymphadenopathy:    She has no cervical adenopathy.  Neurological: She is alert. She has normal reflexes. No cranial nerve deficit. She exhibits normal muscle tone. Coordination normal.  Skin: Skin is warm and dry. No rash noted. No erythema. No pallor.  SKs and lentigos  Psychiatric: She has a normal mood and affect.          Assessment & Plan:   Problem List Items Addressed This Visit     Cardiovascular and  Mediastinum   HYPERTENSION      bp in fair control at this time  BP Readings from Last 1 Encounters:  05/17/14 136/66   No changes needed Disc lifstyle change with low sodium diet and exercise  Labs reviewed  No change in tx  Wt loss enc      Digestive   B12  deficiency     Her level is stable with just mvi now -will continue to watch Enc balanced diet  No changes clinically      Musculoskeletal and Integument   OSTEOPOROSIS     Schedule dexa  Dr Lorin Mercy used to cover Has completed course of fosamax Disc need for calcium/ vitamin D/ wt bearing exercise and bone density test every 2 y to monitor Disc safety/ fracture risk in detail       Relevant Orders      DG Bone Density     Other   Pure hypercholesterolemia     Disc goals for lipids and reasons to control them Rev labs with pt Rev low sat fat diet in detail Diet controlled -is fairly well controlled     Hyperglycemia      Lab Results  Component Value Date   HGBA1C 6.2 05/15/2014   Doing very well with DM Good diet  No medicines     Obesity     Discussed how this problem influences overall health and the risks it imposes  Reviewed plan for weight loss with lower calorie diet (via better food choices and also portion control or program like weight watchers) and exercise building up to or more than 30 minutes 5 days per week including some aerobic activity   She aims to be more active     Encounter for Medicare annual wellness exam - Primary     Reviewed health habits including diet and exercise and skin cancer prevention Reviewed appropriate screening tests for age  Also reviewed health mt list, fam hx and immunization status , as well as social and family history   See HPI imms updated  Labs reviewed      Colon cancer screening     IFOB card given  Would avoid colonosc unless necessary due to age     Relevant Orders      Fecal occult blood, imunochemical    Other Visit Diagnoses   Need for prophylactic vaccination and inoculation against influenza        Relevant Orders       Flu Vaccine QUAD 36+ mos PF IM (Fluarix Quad PF) (Completed)    Need for vaccination with 13-polyvalent pneumococcal conjugate vaccine        Relevant Orders       Pneumococcal conjugate  vaccine 13-valent (Completed)

## 2014-05-17 NOTE — Assessment & Plan Note (Signed)
Schedule dexa  Dr Lorin Mercy used to cover Has completed course of fosamax Disc need for calcium/ vitamin D/ wt bearing exercise and bone density test every 2 y to monitor Disc safety/ fracture risk in detail

## 2014-05-17 NOTE — Assessment & Plan Note (Signed)
Disc goals for lipids and reasons to control them Rev labs with pt Rev low sat fat diet in detail Diet controlled -is fairly well controlled

## 2014-05-17 NOTE — Progress Notes (Signed)
Pre visit review using our clinic review tool, if applicable. No additional management support is needed unless otherwise documented below in the visit note. 

## 2014-05-17 NOTE — Assessment & Plan Note (Signed)
Her level is stable with just mvi now -will continue to watch Enc balanced diet  No changes clinically

## 2014-05-17 NOTE — Patient Instructions (Signed)
Please do stool card for colon cancer screening  Prevnar vaccine  Flu vaccine  Please work on an advanced directive (living will)- I gave you a packet to read  Try to get 1200-1500 mg of calcium per day with at least 1000 iu of vitamin D - for bone health  Stop at check out to schedule a bone density test

## 2014-05-19 NOTE — Assessment & Plan Note (Signed)
Reviewed health habits including diet and exercise and skin cancer prevention Reviewed appropriate screening tests for age  Also reviewed health mt list, fam hx and immunization status , as well as social and family history   See HPI imms updated  Labs reviewed

## 2014-05-21 ENCOUNTER — Ambulatory Visit (INDEPENDENT_AMBULATORY_CARE_PROVIDER_SITE_OTHER)
Admission: RE | Admit: 2014-05-21 | Discharge: 2014-05-21 | Disposition: A | Discharge status: Home / Self Care | Payer: Medicare Other | Source: Ambulatory Visit | Attending: Family Medicine | Admitting: Family Medicine

## 2014-05-21 DIAGNOSIS — M810 Age-related osteoporosis without current pathological fracture: Secondary | ICD-10-CM

## 2014-05-22 ENCOUNTER — Other Ambulatory Visit (INDEPENDENT_AMBULATORY_CARE_PROVIDER_SITE_OTHER): Payer: Medicare Other

## 2014-05-22 DIAGNOSIS — Z1211 Encounter for screening for malignant neoplasm of colon: Secondary | ICD-10-CM

## 2014-05-22 LAB — FECAL OCCULT BLOOD, IMMUNOCHEMICAL: Fecal Occult Bld: NEGATIVE

## 2014-05-25 LAB — HM DEXA SCAN

## 2014-05-29 ENCOUNTER — Encounter: Payer: Self-pay | Admitting: Family Medicine

## 2014-05-30 ENCOUNTER — Encounter: Payer: Self-pay | Admitting: Family Medicine

## 2014-06-04 ENCOUNTER — Encounter: Payer: Self-pay | Admitting: *Deleted

## 2014-06-04 ENCOUNTER — Encounter: Payer: Self-pay | Admitting: Certified Nurse Midwife

## 2014-06-04 ENCOUNTER — Ambulatory Visit (INDEPENDENT_AMBULATORY_CARE_PROVIDER_SITE_OTHER): Payer: Medicare Other | Admitting: Certified Nurse Midwife

## 2014-06-04 ENCOUNTER — Encounter: Payer: Self-pay | Admitting: Family Medicine

## 2014-06-04 VITALS — BP 124/78 | HR 89 | Resp 16 | Ht 60.25 in | Wt 158.0 lb

## 2014-06-04 DIAGNOSIS — Z124 Encounter for screening for malignant neoplasm of cervix: Secondary | ICD-10-CM

## 2014-06-04 DIAGNOSIS — Z01419 Encounter for gynecological examination (general) (routine) without abnormal findings: Secondary | ICD-10-CM

## 2014-06-04 NOTE — Progress Notes (Signed)
80 y.o. G0P0000 Single Caucasian Fe here for annual exam.  Menopausal  No HRT. Denies any vaginal bleeding or vaginal issues today. History of breast cancer and endometrial cancer. Sees PCP for hypertension,GERD and B12 deficiency, elevated cholesterol and glucose control, all labs and aex. No medication change in past year. Orthopedic manages Osteoporosis.Patient still driving but limited amounts. Hearing aids in both ears now.  Patient does not want pap smear this year. Last pap was normal. Aware of history of endometrial cancer and breast cancer. Ambulating well and visits with sister often. No other health issues today.  Patient's last menstrual period was 07/08/1989.          Sexually active: No.  The current method of family planning is status post hysterectomy.    Exercising: Yes.    bowling Smoker:  no  Health Maintenance: Pap:  05-29-13 neg MMG: 05-28-14 composition category b, birads category 2:neg Colonoscopy:  2002 patient does plan to have again. BMD:   05-21-14 TDaP:  2012 Labs: none Self breast exam: done occ   reports that she has never smoked. She has never used smokeless tobacco. She reports that she does not drink alcohol or use illicit drugs.  Past Medical History  Diagnosis Date  . HX: breast cancer 12/99  . HTN (hypertension)   . GERD (gastroesophageal reflux disease)   . Hyperglycemia   . Allergic rhinitis   . Incontinence   . Chronic cough   . Compression fracture   . Diverticulosis   . Hemorrhoids   . Personal history of skin cancer     Basal cell  . Osteoporosis     fosamax in past/ followed by Dr Lorin Mercy   . Endometrial cancer   . Uterine cancer   . Arthritis   . Hematuria   . Diabetes mellitus     borderline    Past Surgical History  Procedure Laterality Date  . Total abdominal hysterectomy w/ bilateral salpingoophorectomy      endometrial cancer  . Mastectomy      Left  . Back surgery  2007    Dr. Lorin Mercy -disk  . Skin cancer excision  10/08     basal cell  . Cholecystectomy  2009  . Cataract extraction  7/10  . Dexa  2/00    LS osteoporsis  . Dexa  2/02    same; OP, LS, spine, hip  . Dexa  3/04    BMD spine; low BMD hip  . Colonoscopy  3/02    Diverticulosis; hemorrhoids  . Abdominal hysterectomy      TAH BSO  . Dilation and curettage of uterus    . Shoulder surgery    . Carpal tunnel release      Current Outpatient Prescriptions  Medication Sig Dispense Refill  . acetaminophen (TYLENOL) 650 MG CR tablet Take 1,300 mg by mouth 2 (two) times daily.        Marland Kitchen aspirin EC 81 MG tablet Take 81 mg by mouth daily.      Marland Kitchen CALCIUM PO Take 500 mg by mouth. 3 daily      . chlorpheniramine (CHLOR-TRIMETON) 4 MG tablet Take 4 mg by mouth 2 (two) times daily as needed for allergies.      . Cholecalciferol (VITAMIN D PO) Take 800 Int'l Units by mouth.      . diltiazem (CARDIZEM CD) 240 MG 24 hr capsule Take 1 capsule (240 mg total) by mouth daily.  90 capsule  3  . docusate sodium (  COLACE) 100 MG capsule Take 100 mg by mouth daily as needed.       . hydrocortisone valerate cream (WESTCORT) 0.2 % Apply 1 application topically daily.      . Multiple Vitamin (MULITIVITAMIN WITH MINERALS) TABS Take 1 tablet by mouth daily.      Marland Kitchen omeprazole (PRILOSEC) 40 MG capsule Take 1 capsule (40 mg total) by mouth 2 (two) times daily.  180 capsule  3  . ondansetron (ZOFRAN) 4 MG tablet Take 4 mg by mouth every 8 (eight) hours as needed for nausea or vomiting.      Marland Kitchen rOPINIRole (REQUIP) 1 MG tablet Take 1 tablet (1 mg total) by mouth daily.  90 tablet  1  . spironolactone (ALDACTONE) 25 MG tablet Take 25 mg by mouth daily.      . traMADol (ULTRAM) 50 MG tablet Take 1 tablet (50 mg total) by mouth at bedtime as needed for moderate pain.  30 tablet  0   No current facility-administered medications for this visit.    Family History  Problem Relation Age of Onset  . Colon cancer Brother   . Breast cancer Sister   . Breast cancer Other     ROS:   Pertinent items are noted in HPI.  Otherwise, a comprehensive ROS was negative.  Exam:   BP 124/78  Pulse 89  Resp 16  Ht 5' 0.25" (1.53 m)  Wt 158 lb (71.668 kg)  BMI 30.62 kg/m2  LMP 07/08/1989 Height: 5' 0.25" (153 cm)  Ht Readings from Last 3 Encounters:  06/04/14 5' 0.25" (1.53 m)  05/17/14 5' (1.524 m)  12/04/13 5' 0.25" (1.53 m)    General appearance: alert, cooperative and appears stated age Head: Normocephalic, without obvious abnormality, atraumatic Neck: no adenopathy, supple, symmetrical, trachea midline and thyroid normal to inspection and palpation Lungs: clear to auscultation bilaterally Breasts: normal appearance, no masses or tenderness, No nipple retraction or dimpling, No nipple discharge or bleeding, No axillary or supraclavicular adenopathy on right. Left mastectomy scars, no masses. Heart: regular rate and rhythm  Abdomen: soft, non-tender; no masses,  no organomegaly Extremities: extremities normal, atraumatic, no cyanosis or edema Skin: Skin color, texture, turgor normal. No rashes or lesions Lymph nodes: Cervical, supraclavicular, and axillary nodes normal. No abnormal inguinal nodes palpated Neurologic: Grossly normal   Pelvic: External genitalia:  no lesions              Urethra:  normal appearing urethra with no masses, tenderness or lesions              Bartholin's and Skene's: normal                 Vagina: normal appearing vagina with normal color and discharge, no lesions              Cervix: absent              Pap taken: No. Bimanual Exam:  Uterus:  uterus absent              Adnexa: no mass, fullness, tenderness and adnexa absent bilateral               Rectovaginal: Confirms               Anus:  normal sphincter tone, no lesions  A:  Well Woman with normal exam  History of left Breast and endometrial cancer s/p TAH with BSO  Menopausal no HRT.  Hypertension, cholesterol, glucose control with  PCP management  Osteoporosis with orthopedic  management  P:   Reviewed health and wellness pertinent to exam  Discussed if she notices any vaginal changes she needs come in for evaluation  Continue MD Follow up as indicated  Pap smear not taken today per patient request   counseled on breast self exam, mammography screening stressed, adequate intake of calcium and vitamin D, diet and exercise  return annually or prn  An After Visit Summary was printed and given to the patient.

## 2014-06-04 NOTE — Patient Instructions (Signed)

## 2014-06-05 ENCOUNTER — Telehealth: Payer: Self-pay | Admitting: Family Medicine

## 2014-06-05 NOTE — Telephone Encounter (Signed)
Patient came for her physical on 05/17/14.  Patient wanted to know when you want her to follow up with you.

## 2014-06-05 NOTE — Progress Notes (Signed)
Reviewed personally.  M. Suzanne Emiley Digiacomo, MD.  

## 2014-06-05 NOTE — Telephone Encounter (Signed)
6 months would be good-thanks

## 2014-06-05 NOTE — Telephone Encounter (Signed)
appt scheduled

## 2014-08-15 ENCOUNTER — Other Ambulatory Visit: Payer: Self-pay | Admitting: Family Medicine

## 2014-09-26 ENCOUNTER — Other Ambulatory Visit: Payer: Self-pay | Admitting: Family Medicine

## 2014-10-23 ENCOUNTER — Other Ambulatory Visit: Payer: Self-pay | Admitting: Family Medicine

## 2014-10-28 ENCOUNTER — Other Ambulatory Visit: Payer: Self-pay | Admitting: Family Medicine

## 2014-11-25 ENCOUNTER — Ambulatory Visit: Payer: Medicare Other | Admitting: Family Medicine

## 2014-11-27 ENCOUNTER — Ambulatory Visit (INDEPENDENT_AMBULATORY_CARE_PROVIDER_SITE_OTHER): Payer: Medicare Other | Admitting: Family Medicine

## 2014-11-27 ENCOUNTER — Encounter: Payer: Self-pay | Admitting: Family Medicine

## 2014-11-27 VITALS — BP 120/78 | HR 100 | Temp 98.2°F | Wt 166.1 lb

## 2014-11-27 DIAGNOSIS — I10 Essential (primary) hypertension: Secondary | ICD-10-CM | POA: Diagnosis not present

## 2014-11-27 DIAGNOSIS — E669 Obesity, unspecified: Secondary | ICD-10-CM

## 2014-11-27 DIAGNOSIS — R739 Hyperglycemia, unspecified: Secondary | ICD-10-CM | POA: Diagnosis not present

## 2014-11-27 MED ORDER — HYDROCORTISONE VALERATE 0.2 % EX CREA: 1.0000 "application " | TOPICAL_CREAM | Freq: Every day | CUTANEOUS | Status: DC

## 2014-11-27 NOTE — Assessment & Plan Note (Signed)
bp in fair control at this time  BP Readings from Last 1 Encounters:  11/27/14 120/78   No changes needed Disc lifstyle change with low sodium diet and exercise  F/u 6 mo

## 2014-11-27 NOTE — Progress Notes (Signed)
Subjective:    Patient ID: [name], female    DOB: 1927-01-30, 81 y.o.   MRN: [id number]  HPI Here for f/u of chronic medical problems  Wt is up 8 lb with bmi of 32  Obesity  Diet is not optimal - too many bread products , loves sweets-eats them sometimes   Lab Results  Component Value Date   HGBA1C 6.2 05/15/2014   Hyperglycemia  She watches her blood sugars am 90s , bedtime low 100s  Goes up more after supper - like 140s   No regular exercise  [place] is about to wrap up  Tries to walk outside when the weather is nice   bp is stable today  No cp or palpitations or headaches or edema  No side effects to medicines  BP Readings from Last 3 Encounters:  11/27/14 120/78  06/04/14 124/78  05/17/14 136/66     Still working on going through her brother's things (he passed) - step by step  Is close to her 2 sisters   Patient Active Problem List   Diagnosis Date Noted  . Shingles 12/04/2013  . Gastroenteritis 10/31/2013  . Encounter for Medicare annual wellness exam 01/31/2013  . Colon cancer screening 01/31/2013  . Constipation 09/28/2012  . Dyspnea 01/17/2012  . Anemia 01/05/2012  . Chronic cough 12/27/2011  . 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 Pedal edema 12/22/2011  . B12 deficiency 12/22/2011  . Chest pain 11/21/2011  . Obesity 11/10/2011  . Hyperglycemia 05/11/2010  . Pure hypercholesterolemia 11/03/2009  . BACK PAIN 10/14/2009  . MICROSCOPIC HEMATURIA 12/06/2008  . URINARY INCONTINENCE, MIXED 12/06/2008  . ALLERGIC RHINITIS 04/22/2008  . OSTEOPOROSIS 04/22/2008  . CHOLELITHIASIS 09/06/2007  . RESTLESS LEG SYNDROME 05/05/2007  . KERATOSIS, [place] NEC 05/05/2007  . OSTEOARTHRITIS 12/15/2006  . BREAST CANCER, HX OF 12/11/2006  . Essential hypertension 11/29/2006  . GERD 11/29/2006   Past Medical History  Diagnosis Date  . HX: breast cancer 12/99  . HTN (hypertension)   . GERD (gastroesophageal reflux disease)   . Hyperglycemia   .  Allergic rhinitis   . Incontinence   . Chronic cough   . Compression fracture   . Diverticulosis   . Hemorrhoids   . Personal history of skin cancer     Basal cell  . Osteoporosis     fosamax in past/ followed by Dr Lorin Mercy   . Endometrial cancer   . Uterine cancer   . Arthritis   . Hematuria   . Diabetes mellitus     borderline   Past Surgical History  Procedure Laterality Date  . Total abdominal hysterectomy w/ bilateral salpingoophorectomy      endometrial cancer  . Mastectomy      Left  . Back surgery  2007    Dr. Lorin Mercy -disk  . Skin cancer excision  10/08    basal cell  . Cholecystectomy  2009  . Cataract extraction  7/10  . Dexa  2/00    LS osteoporsis  . Dexa  2/02    same; OP, LS, spine, hip  . Dexa  3/04    BMD spine; low BMD hip  . Colonoscopy  3/02    Diverticulosis; hemorrhoids  . Abdominal hysterectomy      TAH BSO  . Dilation and curettage of uterus    . Shoulder surgery    . Carpal tunnel release     History  Substance Use Topics  . Smoking status: Never Smoker   .  Smokeless tobacco: Never Used  . Alcohol Use: No   Family History  Problem Relation Age of Onset  . Colon cancer Brother   . Breast cancer Sister   . Breast cancer Other    Allergies  Allergen Reactions  . Amoxicillin-Pot Clavulanate Nausea Only  . Clarithromycin Nausea Only  . Codeine Nausea Only  . Sulfonamide Derivatives Nausea Only  . Tetracycline Nausea Only   Current Outpatient Prescriptions on File Prior to Visit  Medication Sig Dispense Refill  . acetaminophen (TYLENOL) 650 MG CR tablet Take 1,300 mg by mouth 2 (two) times daily.      Marland Kitchen aspirin EC 81 MG tablet Take 81 mg by mouth daily.    Marland Kitchen CALCIUM PO Take 500 mg by mouth. 3 daily    . CARTIA XT 240 MG 24 hr capsule TAKE 1 CAPSULE EVERY DAY 90 capsule 3  . chlorpheniramine (CHLOR-TRIMETON) 4 MG tablet Take 4 mg by mouth 2 (two) times daily as needed for allergies.    . Cholecalciferol (VITAMIN D PO) Take 800 Int'l  Units by mouth.    . docusate sodium (COLACE) 100 MG capsule Take 100 mg by mouth daily as needed.     . hydrocortisone valerate cream (WESTCORT) 0.2 % Apply 1 application topically daily.    . Multiple Vitamin (MULITIVITAMIN WITH MINERALS) TABS Take 1 tablet by mouth daily.    Marland Kitchen omeprazole (PRILOSEC) 40 MG capsule TAKE 1 CAPSULE TWICE DAILY 180 capsule 3  . ondansetron (ZOFRAN) 4 MG tablet Take 4 mg by mouth every 8 (eight) hours as needed for nausea or vomiting.    Marland Kitchen rOPINIRole (REQUIP) 1 MG tablet TAKE 1 TABLET EVERY DAY 90 tablet 1  . spironolactone (ALDACTONE) 25 MG tablet TAKE 1 TABLET EVERY DAY 90 tablet 3  . traMADol (ULTRAM) 50 MG tablet Take 1 tablet (50 mg total) by mouth at bedtime as needed for moderate pain. 30 tablet 0   No current facility-administered medications on file prior to visit.    Review of Systems Review of Systems  Constitutional: Negative for fever, appetite change, fatigue and unexpected weight change.  Eyes: Negative for pain and visual disturbance.  Respiratory: Negative for cough and shortness of breath.   Cardiovascular: Negative for cp or palpitations    Gastrointestinal: Negative for nausea, diarrhea and constipation.  Genitourinary: Negative for urgency and frequency.  Skin: Negative for pallor or rash   Neurological: Negative for weakness, light-headedness, numbness and headaches.  Hematological: Negative for adenopathy. Does not bruise/bleed easily.  Psychiatric/Behavioral: Negative for dysphoric mood. The patient is not nervous/anxious.         Objective:   Physical Exam  Constitutional: She appears well-developed and well-nourished. No distress.  obese and well appearing   HENT:  Head: Normocephalic and atraumatic.  Mouth/Throat: Oropharynx is clear and moist.  Eyes: Conjunctivae and EOM are normal. Pupils are equal, round, and reactive to light. No scleral icterus.  Neck: Normal range of motion. Neck supple. No JVD present. Carotid bruit is  not present. No thyromegaly present.  Cardiovascular: Normal rate, regular rhythm, normal heart sounds and intact distal pulses.  Exam reveals no gallop.   Pulmonary/Chest: Effort normal and breath sounds normal. No respiratory distress. She has no wheezes. She has no rales.  Abdominal: Soft. Bowel sounds are normal.  Musculoskeletal: She exhibits no edema.  Lymphadenopathy:    She has no cervical adenopathy.  Neurological: She is alert. She has normal reflexes. No cranial nerve deficit. She exhibits normal  muscle tone. Coordination normal.  Skin: Skin is warm and dry. No rash noted. No erythema. No pallor.  Psychiatric: She has a normal mood and affect.          Assessment & Plan:   Problem List Items Addressed This Visit      Cardiovascular and Mediastinum   Essential hypertension - Primary    bp in fair control at this time  BP Readings from Last 1 Encounters:  11/27/14 120/78   No changes needed Disc lifstyle change with low sodium diet and exercise  F/u 6 mo        Other   Hyperglycemia    A1C today Blood glucose readings at home are fairly good  Disc imp of wt loss Rev low glycemic diet -needs to cut out more sugar Disc plan to walk F/u 6 mo for annual exam      Relevant Orders   Hemoglobin A1c (Completed)   Obesity    Discussed how this problem influences overall health and the risks it imposes  Reviewed plan for weight loss with lower calorie diet (via better food choices and also portion control or program like weight watchers) and exercise building up to or more than 30 minutes 5 days per week including some aerobic activity

## 2014-11-27 NOTE — Patient Instructions (Signed)
Lab today Work on [employer] and exercise (less sugar and carbohydrates) See you in September

## 2014-11-27 NOTE — Progress Notes (Signed)
Pre visit review using our clinic review tool, if applicable. No additional management support is needed unless otherwise documented below in the visit note. 

## 2014-11-27 NOTE — Assessment & Plan Note (Signed)
A1C today Blood glucose readings at home are fairly good  Disc imp of wt loss Rev low glycemic diet -needs to cut out more sugar Disc plan to walk F/u 6 mo for annual exam

## 2014-11-27 NOTE — Assessment & Plan Note (Signed)
Discussed how this problem influences overall health and the risks it imposes  Reviewed plan for weight loss with lower calorie diet (via better food choices and also portion control or program like weight watchers) and exercise building up to or more than 30 minutes 5 days per week including some aerobic activity    

## 2014-11-28 LAB — HEMOGLOBIN A1C: Hgb A1c MFr Bld: 6.5 % (ref 4.6–6.5)

## 2014-12-12 ENCOUNTER — Telehealth: Payer: Self-pay

## 2014-12-12 NOTE — Telephone Encounter (Signed)
Pt wanted to verify hydrocortisone cream was sent to [place] when pt was seen on 11/27/14. Advised pt was sent electronically; pt will ck with pharmacy.

## 2014-12-13 ENCOUNTER — Telehealth: Payer: Self-pay | Admitting: *Deleted

## 2014-12-13 MED ORDER — HYDROCORTISONE VALERATE 0.2 % EX CREA: 1.0000 "application " | TOPICAL_CREAM | Freq: Every day | CUTANEOUS | Status: DC

## 2014-12-13 NOTE — Telephone Encounter (Signed)
Pt called as a f/u from yesterday re: hydrocortisone rx. She only uses it prn, but needed a 90 day rx sent in because the smaller tube wouldn't last 3 months and it cost $82. I resent in rx for 60g tube instead of 45gm with 2 refills.

## 2015-01-01 MED ORDER — MOMETASONE FUROATE 0.1 % EX CREA: 1.0000 "application " | TOPICAL_CREAM | Freq: Every day | CUTANEOUS | Status: DC

## 2015-01-01 NOTE — Telephone Encounter (Signed)
I do not know what her insurance prefers  I will write for generic elocon cream for call in - may need to check with the pharmacist to make sure she can afford it

## 2015-01-01 NOTE — Telephone Encounter (Addendum)
Pt left v/m; pt has spoke with ins co about cost of hydrocortisone cream and pt wants to know if Dr Glori Bickers could give substitute med that is not expensive. Pt request cb. [facility] pharmacy.

## 2015-01-01 NOTE — Addendum Note (Signed)
Addended by: Loura Pardon A on: 01/01/2015 08:46 PM   Modules accepted: Orders

## 2015-01-02 NOTE — Telephone Encounter (Signed)
Pt notified Rx sent and to check price of Rx with pharmacy

## 2015-01-02 NOTE — Telephone Encounter (Signed)
Left voicemail requesting pt to call office back 

## 2015-04-23 ENCOUNTER — Other Ambulatory Visit: Payer: Self-pay | Admitting: Family Medicine

## 2015-04-30 ENCOUNTER — Emergency Department (HOSPITAL_COMMUNITY): Payer: Medicare Other

## 2015-04-30 ENCOUNTER — Encounter (HOSPITAL_COMMUNITY): Payer: Self-pay | Admitting: *Deleted

## 2015-04-30 ENCOUNTER — Inpatient Hospital Stay (HOSPITAL_COMMUNITY)
Admission: EM | Admit: 2015-04-30 | Discharge: 2015-05-06 | DRG: 493 | Disposition: A | Discharge status: Skilled Nursing Facility | Payer: Medicare Other | Attending: Internal Medicine | Admitting: Internal Medicine

## 2015-04-30 DIAGNOSIS — E7800 Pure hypercholesterolemia, unspecified: Secondary | ICD-10-CM | POA: Diagnosis present

## 2015-04-30 DIAGNOSIS — M79676 Pain in unspecified toe(s): Secondary | ICD-10-CM

## 2015-04-30 DIAGNOSIS — D72829 Elevated white blood cell count, unspecified: Secondary | ICD-10-CM | POA: Diagnosis not present

## 2015-04-30 DIAGNOSIS — Z90710 Acquired absence of both cervix and uterus: Secondary | ICD-10-CM | POA: Diagnosis not present

## 2015-04-30 DIAGNOSIS — W010XXA Fall on same level from slipping, tripping and stumbling without subsequent striking against object, initial encounter: Secondary | ICD-10-CM | POA: Diagnosis present

## 2015-04-30 DIAGNOSIS — S82401A Unspecified fracture of shaft of right fibula, initial encounter for closed fracture: Secondary | ICD-10-CM

## 2015-04-30 DIAGNOSIS — K219 Gastro-esophageal reflux disease without esophagitis: Secondary | ICD-10-CM | POA: Diagnosis present

## 2015-04-30 DIAGNOSIS — M810 Age-related osteoporosis without current pathological fracture: Secondary | ICD-10-CM | POA: Diagnosis present

## 2015-04-30 DIAGNOSIS — Z800 Family history of malignant neoplasm of digestive organs: Secondary | ICD-10-CM | POA: Diagnosis not present

## 2015-04-30 DIAGNOSIS — Z803 Family history of malignant neoplasm of breast: Secondary | ICD-10-CM | POA: Diagnosis not present

## 2015-04-30 DIAGNOSIS — S82841D Displaced bimalleolar fracture of right lower leg, subsequent encounter for closed fracture with routine healing: Secondary | ICD-10-CM | POA: Diagnosis not present

## 2015-04-30 DIAGNOSIS — I10 Essential (primary) hypertension: Secondary | ICD-10-CM | POA: Diagnosis present

## 2015-04-30 DIAGNOSIS — S82841A Displaced bimalleolar fracture of right lower leg, initial encounter for closed fracture: Secondary | ICD-10-CM | POA: Diagnosis present

## 2015-04-30 DIAGNOSIS — S82891A Other fracture of right lower leg, initial encounter for closed fracture: Secondary | ICD-10-CM | POA: Diagnosis not present

## 2015-04-30 DIAGNOSIS — I5032 Chronic diastolic (congestive) heart failure: Secondary | ICD-10-CM | POA: Diagnosis present

## 2015-04-30 DIAGNOSIS — S92401A Displaced unspecified fracture of right great toe, initial encounter for closed fracture: Secondary | ICD-10-CM | POA: Diagnosis present

## 2015-04-30 DIAGNOSIS — Z85828 Personal history of other malignant neoplasm of skin: Secondary | ICD-10-CM | POA: Diagnosis not present

## 2015-04-30 DIAGNOSIS — J9811 Atelectasis: Secondary | ICD-10-CM | POA: Diagnosis not present

## 2015-04-30 DIAGNOSIS — R0902 Hypoxemia: Secondary | ICD-10-CM | POA: Diagnosis not present

## 2015-04-30 DIAGNOSIS — M1990 Unspecified osteoarthritis, unspecified site: Secondary | ICD-10-CM | POA: Diagnosis present

## 2015-04-30 DIAGNOSIS — R112 Nausea with vomiting, unspecified: Secondary | ICD-10-CM

## 2015-04-30 DIAGNOSIS — Z853 Personal history of malignant neoplasm of breast: Secondary | ICD-10-CM | POA: Diagnosis not present

## 2015-04-30 DIAGNOSIS — S82891D Other fracture of right lower leg, subsequent encounter for closed fracture with routine healing: Secondary | ICD-10-CM | POA: Diagnosis not present

## 2015-04-30 DIAGNOSIS — K5900 Constipation, unspecified: Secondary | ICD-10-CM | POA: Diagnosis not present

## 2015-04-30 DIAGNOSIS — E871 Hypo-osmolality and hyponatremia: Secondary | ICD-10-CM | POA: Diagnosis not present

## 2015-04-30 DIAGNOSIS — K567 Ileus, unspecified: Secondary | ICD-10-CM | POA: Diagnosis not present

## 2015-04-30 DIAGNOSIS — M25571 Pain in right ankle and joints of right foot: Secondary | ICD-10-CM | POA: Diagnosis present

## 2015-04-30 DIAGNOSIS — G2581 Restless legs syndrome: Secondary | ICD-10-CM | POA: Diagnosis present

## 2015-04-30 DIAGNOSIS — Z419 Encounter for procedure for purposes other than remedying health state, unspecified: Secondary | ICD-10-CM

## 2015-04-30 DIAGNOSIS — E1165 Type 2 diabetes mellitus with hyperglycemia: Secondary | ICD-10-CM | POA: Diagnosis present

## 2015-04-30 DIAGNOSIS — Z01810 Encounter for preprocedural cardiovascular examination: Secondary | ICD-10-CM | POA: Diagnosis not present

## 2015-04-30 DIAGNOSIS — Z8542 Personal history of malignant neoplasm of other parts of uterus: Secondary | ICD-10-CM

## 2015-04-30 DIAGNOSIS — Z7982 Long term (current) use of aspirin: Secondary | ICD-10-CM

## 2015-04-30 HISTORY — DX: Unspecified urinary incontinence: R32

## 2015-04-30 HISTORY — DX: Basal cell carcinoma of skin of other part of trunk: C44.519

## 2015-04-30 HISTORY — DX: Malignant neoplasm of unspecified site of left female breast: C50.912

## 2015-04-30 HISTORY — DX: Family history of other specified conditions: Z84.89

## 2015-04-30 LAB — CBC WITH DIFFERENTIAL/PLATELET
BASOS ABS: 0 10*3/uL (ref 0.0–0.1)
Basophils Relative: 0 % (ref 0–1)
Eosinophils Absolute: 0 10*3/uL (ref 0.0–0.7)
Eosinophils Relative: 0 % (ref 0–5)
HEMATOCRIT: 39.5 % (ref 36.0–46.0)
HEMOGLOBIN: 13.5 g/dL (ref 12.0–15.0)
LYMPHS PCT: 6 % — AB (ref 12–46)
Lymphs Abs: 0.9 10*3/uL (ref 0.7–4.0)
MCH: 31.3 pg (ref 26.0–34.0)
MCHC: 34.2 g/dL (ref 30.0–36.0)
MCV: 91.6 fL (ref 78.0–100.0)
MONO ABS: 1.1 10*3/uL — AB (ref 0.1–1.0)
Monocytes Relative: 7 % (ref 3–12)
NEUTROS ABS: 13.1 10*3/uL — AB (ref 1.7–7.7)
Neutrophils Relative %: 87 % — ABNORMAL HIGH (ref 43–77)
Platelets: 311 10*3/uL (ref 150–400)
RBC: 4.31 MIL/uL (ref 3.87–5.11)
RDW: 13.9 % (ref 11.5–15.5)
WBC: 15.1 10*3/uL — AB (ref 4.0–10.5)

## 2015-04-30 LAB — URINALYSIS, ROUTINE W REFLEX MICROSCOPIC
Bilirubin Urine: NEGATIVE
Glucose, UA: NEGATIVE mg/dL
Ketones, ur: NEGATIVE mg/dL
LEUKOCYTES UA: NEGATIVE
NITRITE: NEGATIVE
PH: 6 (ref 5.0–8.0)
Protein, ur: NEGATIVE mg/dL
SPECIFIC GRAVITY, URINE: 1.016 (ref 1.005–1.030)
Urobilinogen, UA: 0.2 mg/dL (ref 0.0–1.0)

## 2015-04-30 LAB — BASIC METABOLIC PANEL
ANION GAP: 10 (ref 5–15)
BUN: 19 mg/dL (ref 6–20)
CHLORIDE: 99 mmol/L — AB (ref 101–111)
CO2: 26 mmol/L (ref 22–32)
Calcium: 9.6 mg/dL (ref 8.9–10.3)
Creatinine, Ser: 0.97 mg/dL (ref 0.44–1.00)
GFR calc Af Amer: 60 mL/min — ABNORMAL LOW (ref >60)
GFR calc non Af Amer: 51 mL/min — ABNORMAL LOW (ref >60)
GLUCOSE: 120 mg/dL — AB (ref 65–99)
POTASSIUM: 4.4 mmol/L (ref 3.5–5.1)
Sodium: 135 mmol/L (ref 135–145)

## 2015-04-30 LAB — URINE MICROSCOPIC-ADD ON

## 2015-04-30 MED ORDER — ONDANSETRON HCL 4 MG PO TABS: 4.0000 mg | ORAL_TABLET | Freq: Three times a day (TID) | ORAL | Status: DC | PRN

## 2015-04-30 MED ORDER — MORPHINE SULFATE (PF) 4 MG/ML IV SOLN
4.0000 mg | Freq: Once | INTRAVENOUS | Status: AC
  Administered 2015-04-30: 4 mg via INTRAVENOUS
  Filled 2015-04-30: qty 1

## 2015-04-30 MED ORDER — ONDANSETRON HCL 4 MG/2ML IJ SOLN: 4.0000 mg | Freq: Four times a day (QID) | INTRAMUSCULAR | Status: DC | PRN

## 2015-04-30 MED ORDER — SPIRONOLACTONE 25 MG PO TABS
25.0000 mg | ORAL_TABLET | Freq: Every day | ORAL | Status: DC
  Administered 2015-04-30 – 2015-05-06 (×6): 25 mg via ORAL
  Filled 2015-04-30 (×7): qty 1

## 2015-04-30 MED ORDER — IBUPROFEN 200 MG PO TABS: 800.0000 mg | ORAL_TABLET | Freq: Four times a day (QID) | ORAL | Status: DC | PRN

## 2015-04-30 MED ORDER — PANTOPRAZOLE SODIUM 40 MG PO TBEC
40.0000 mg | DELAYED_RELEASE_TABLET | Freq: Every day | ORAL | Status: DC
  Administered 2015-04-30 – 2015-05-03 (×3): 40 mg via ORAL
  Filled 2015-04-30 (×3): qty 1

## 2015-04-30 MED ORDER — ADULT MULTIVITAMIN W/MINERALS CH
1.0000 | ORAL_TABLET | Freq: Every day | ORAL | Status: DC
  Administered 2015-04-30 – 2015-05-06 (×5): 1 via ORAL
  Filled 2015-04-30 (×5): qty 1

## 2015-04-30 MED ORDER — DOCUSATE SODIUM 100 MG PO CAPS: 100.0000 mg | ORAL_CAPSULE | Freq: Every day | ORAL | Status: DC | PRN

## 2015-04-30 MED ORDER — DILTIAZEM HCL ER COATED BEADS 240 MG PO CP24
240.0000 mg | ORAL_CAPSULE | Freq: Every day | ORAL | Status: DC
  Administered 2015-04-30 – 2015-05-06 (×6): 240 mg via ORAL
  Filled 2015-04-30 (×7): qty 1

## 2015-04-30 MED ORDER — CHOLECALCIFEROL 10 MCG (400 UNIT) PO TABS
800.0000 [IU] | ORAL_TABLET | Freq: Every day | ORAL | Status: DC
  Administered 2015-04-30 – 2015-05-06 (×5): 800 [IU] via ORAL
  Filled 2015-04-30 (×7): qty 2

## 2015-04-30 MED ORDER — SODIUM CHLORIDE 0.9 % IJ SOLN: 3.0000 mL | INTRAMUSCULAR | Status: DC | PRN

## 2015-04-30 MED ORDER — SODIUM CHLORIDE 0.9 % IV SOLN: 250.0000 mL | INTRAVENOUS | Status: DC | PRN

## 2015-04-30 MED ORDER — FENTANYL CITRATE (PF) 100 MCG/2ML IJ SOLN
50.0000 ug | Freq: Once | INTRAMUSCULAR | Status: AC
  Administered 2015-04-30: 50 ug via INTRAVENOUS
  Filled 2015-04-30: qty 2

## 2015-04-30 MED ORDER — ALUM & MAG HYDROXIDE-SIMETH 200-200-20 MG/5ML PO SUSP
30.0000 mL | Freq: Four times a day (QID) | ORAL | Status: DC | PRN
Start: 2015-04-30 — End: 2015-05-06

## 2015-04-30 MED ORDER — ROPINIROLE HCL 1 MG PO TABS
1.0000 mg | ORAL_TABLET | Freq: Every day | ORAL | Status: DC
  Administered 2015-04-30 – 2015-05-01 (×2): 1 mg via ORAL
  Filled 2015-04-30 (×3): qty 1

## 2015-04-30 MED ORDER — DOCUSATE SODIUM 100 MG PO CAPS
100.0000 mg | ORAL_CAPSULE | Freq: Two times a day (BID) | ORAL | Status: DC
  Administered 2015-04-30 – 2015-05-03 (×6): 100 mg via ORAL
  Filled 2015-04-30 (×6): qty 1

## 2015-04-30 MED ORDER — MORPHINE SULFATE (PF) 2 MG/ML IV SOLN
2.0000 mg | INTRAVENOUS | Status: DC | PRN
Start: 2015-04-30 — End: 2015-05-04
  Administered 2015-04-30: 4 mg via INTRAVENOUS
  Administered 2015-04-30 – 2015-05-01 (×3): 2 mg via INTRAVENOUS
  Administered 2015-05-01: 4 mg via INTRAVENOUS
  Administered 2015-05-01 – 2015-05-02 (×3): 2 mg via INTRAVENOUS
  Administered 2015-05-03 (×2): 4 mg via INTRAVENOUS
  Administered 2015-05-03: 2 mg via INTRAVENOUS
  Administered 2015-05-03: 4 mg via INTRAVENOUS
  Filled 2015-04-30: qty 1
  Filled 2015-04-30: qty 2
  Filled 2015-04-30 (×3): qty 1
  Filled 2015-04-30 (×2): qty 2
  Filled 2015-04-30 (×2): qty 1
  Filled 2015-04-30 (×3): qty 2

## 2015-04-30 MED ORDER — SODIUM CHLORIDE 0.9 % IJ SOLN
3.0000 mL | Freq: Two times a day (BID) | INTRAMUSCULAR | Status: DC
  Administered 2015-04-30 – 2015-05-06 (×7): 3 mL via INTRAVENOUS
  Filled 2015-04-30: qty 3

## 2015-04-30 MED ORDER — ENOXAPARIN SODIUM 40 MG/0.4ML ~~LOC~~ SOLN
40.0000 mg | SUBCUTANEOUS | Status: DC
  Filled 2015-04-30: qty 0.4

## 2015-04-30 MED ORDER — ACETAMINOPHEN 325 MG PO TABS
650.0000 mg | ORAL_TABLET | Freq: Four times a day (QID) | ORAL | Status: DC
  Administered 2015-04-30 – 2015-05-06 (×17): 650 mg via ORAL
  Filled 2015-04-30 (×19): qty 2

## 2015-04-30 MED ORDER — MORPHINE SULFATE (PF) 4 MG/ML IV SOLN: 4.0000 mg | INTRAVENOUS | Status: DC | PRN

## 2015-04-30 MED ORDER — ONDANSETRON HCL 4 MG PO TABS: 4.0000 mg | ORAL_TABLET | Freq: Four times a day (QID) | ORAL | Status: DC | PRN

## 2015-04-30 MED ORDER — ACETAMINOPHEN 650 MG RE SUPP: 650.0000 mg | Freq: Four times a day (QID) | RECTAL | Status: DC | PRN

## 2015-04-30 MED ORDER — ACETAMINOPHEN 325 MG PO TABS
650.0000 mg | ORAL_TABLET | Freq: Four times a day (QID) | ORAL | Status: DC | PRN
  Filled 2015-04-30: qty 2

## 2015-04-30 MED ORDER — ACETAMINOPHEN ER 650 MG PO TBCR: 1300.0000 mg | EXTENDED_RELEASE_TABLET | Freq: Two times a day (BID) | ORAL | Status: DC

## 2015-04-30 NOTE — ED Notes (Signed)
Pt. Is from home and fell at 2300 last night on her way to bed. Pt. Twisted her right ankle and reports numbness on big toe. Pedal pulses present per EMS

## 2015-04-30 NOTE — H&P (Signed)
[facility] History and Physical                                                                                    [name], is a 81 y.o. female  MRN: [id number]   DOB - [birth year]  Admit Date - 04/30/2015  Outpatient Primary MD for the patient is Loura Pardon, MD  Referring Physician:  Dr. Kathrynn Humble  Chief Complaint:   Chief Complaint  Patient presents with  . Fall     HPI  [name]  is a 81 y.o. female, with hypertension, GERD, osteoporosis and a history of breast cancer and endometrial cancer. She presents the emergency department with a right ankle fracture. [name] denies any recent illness or dizziness. She states that she tripped over the carpet last night was down on the floor for a couple of hours but managed to get to the phone to call 911. She lives at home alone, her sister lives next door. She does describe mild increased shortness of breath with exertion, but denies cough, lower extremity edema, paroxysmal nocturnal dyspnea, orthopnea. She reports that she has not had any other recent falls. At home she is completely independent with her ADLs, ambulates on her own without difficulty.  In the emergency department x-ray of her right ankle revealed a bimalleolar fracture. The EDP consults orthopedic surgery and Dr. Erlinda Hong will see her in consultation.  He is planning on surgical repair on Friday.   She is non-weight bearing prior to admission.    Review of Systems   In addition to the HPI above,  No Fever-chills, No Headache, No changes with Vision or hearing, No problems swallowing food or Liquids, No Chest pain No Abdominal pain, No Nausea or Vomiting, Bowel movements are regular, No Blood in stool or Urine, No dysuria, No new skin rashes or bruises, No new joints pains-aches,  No new weakness, tingling, numbness in any extremity, No recent weight gain or loss, A full 10 point Review of Systems was done, except as stated above, all other Review of Systems were  negative.  Past Medical History  Past Medical History  Diagnosis Date  . HX: breast cancer 12/99  . HTN (hypertension)   . GERD (gastroesophageal reflux disease)   . Hyperglycemia   . Allergic rhinitis   . Incontinence   . Chronic cough   . Compression fracture   . Diverticulosis   . Hemorrhoids   . Personal history of skin cancer     Basal cell  . Osteoporosis     fosamax in past/ followed by Dr Lorin Mercy   . Endometrial cancer   . Uterine cancer   . Arthritis   . Hematuria   . Diabetes mellitus     borderline    Past Surgical History  Procedure Laterality Date  . Total abdominal hysterectomy w/ bilateral salpingoophorectomy      endometrial cancer  . Mastectomy      Left  . Back surgery  2007    Dr. Lorin Mercy -disk  . Skin cancer excision  10/08    basal cell  . Cholecystectomy  2009  . Cataract extraction  7/10  .  Dexa  2/00    LS osteoporsis  . Dexa  2/02    same; OP, LS, spine, hip  . Dexa  3/04    BMD spine; low BMD hip  . Colonoscopy  3/02    Diverticulosis; hemorrhoids  . Abdominal hysterectomy      TAH BSO  . Dilation and curettage of uterus    . Shoulder surgery    . Carpal tunnel release        Social History Social History  Substance Use Topics  . Smoking status: Never Smoker   . Smokeless tobacco: Never Used  . Alcohol Use: No  Lives at home alone, independent  Family History Family History  Problem Relation Age of Onset  . Colon cancer Brother   . Breast cancer Sister   . Breast cancer Other     Prior to Admission medications   Medication Sig Start Date End Date Taking? Authorizing Provider  acetaminophen (TYLENOL) 650 MG CR tablet Take 1,300 mg by mouth 2 (two) times daily.     Yes Historical Provider, MD  aspirin EC 81 MG tablet Take 81 mg by mouth daily.   Yes Historical Provider, MD  CALCIUM PO Take 500 mg by mouth 3 (three) times daily.    Yes Historical Provider, MD  CARTIA XT 240 MG 24 hr capsule TAKE 1 CAPSULE EVERY DAY  10/23/14  Yes Abner Greenspan, MD  chlorpheniramine (CHLOR-TRIMETON) 4 MG tablet Take 4 mg by mouth 2 (two) times daily as needed for allergies.   Yes Historical Provider, MD  Cholecalciferol (VITAMIN D PO) Take 800 Units by mouth daily.    Yes Historical Provider, MD  docusate sodium (COLACE) 100 MG capsule Take 100 mg by mouth daily as needed for mild constipation.    Yes Historical Provider, MD  Multiple Vitamin (MULITIVITAMIN WITH MINERALS) TABS Take 1 tablet by mouth daily.   Yes Historical Provider, MD  omeprazole (PRILOSEC) 40 MG capsule TAKE 1 CAPSULE TWICE DAILY 10/23/14  Yes Abner Greenspan, MD  ondansetron (ZOFRAN) 4 MG tablet Take 4 mg by mouth every 8 (eight) hours as needed for nausea or vomiting.   Yes Historical Provider, MD  rOPINIRole (REQUIP) 1 MG tablet TAKE 1 TABLET EVERY DAY 04/23/15  Yes Abner Greenspan, MD  spironolactone (ALDACTONE) 25 MG tablet TAKE 1 TABLET EVERY DAY 10/23/14  Yes Abner Greenspan, MD  hydrocortisone valerate cream (WESTCORT) 0.2 % Apply 1 application topically daily. Patient not taking: Reported on 04/30/2015 12/13/14   Abner Greenspan, MD  mometasone (ELOCON) 0.1 % cream Apply 1 application topically daily. Apply daily as needed to affected areas Patient not taking: Reported on 04/30/2015 01/01/15   Abner Greenspan, MD  traMADol (ULTRAM) 50 MG tablet Take 1 tablet (50 mg total) by mouth at bedtime as needed for moderate pain. Patient not taking: Reported on 04/30/2015 12/04/13   Abner Greenspan, MD    Allergies  Allergen Reactions  . Amoxicillin-Pot Clavulanate Nausea Only  . Clarithromycin Nausea Only  . Codeine Nausea Only  . Sulfonamide Derivatives Nausea Only  . Tetracycline Nausea Only    Physical Exam  Vitals  Blood pressure 141/54, pulse 95, temperature 97.9 F (36.6 C), temperature source Oral, resp. rate 20, height 5' (1.524 m), weight 72.576 kg (160 lb), last menstrual period 07/08/1989, SpO2 95 %.   General: well developed, female, calm, lying in bed  in NAD  Psych:  Normal affect and insight, Not Suicidal or Homicidal, Awake  Alert, Oriented X 3.  Neuro:   No F.N deficits, ALL C.Nerves Intact, Sensation intact all 4 extremities.  ENT:  Ears and Eyes appear Normal, Conjunctivae clear, PER. Moist oral mucosa without erythema or exudates.  Neck:  Supple, No lymphadenopathy appreciated  Respiratory:  Symmetrical chest wall movement, Good air movement bilaterally, CTAB.  Cardiac:  RRR, No Murmurs, bilateral LE edema noted, no JVD.    Abdomen:  Positive bowel sounds, Soft, Non tender, Non distended,  No masses appreciated  Skin:  No Cyanosis, Normal Skin Turgor, No Skin Rash or Bruise.  Extremities:  RLE 1-2 edema, tender to palpation.    Data Review  CBC  Recent Labs Lab 04/30/15 0330  WBC 15.1*  HGB 13.5  HCT 39.5  PLT 311  MCV 91.6  MCH 31.3  MCHC 34.2  RDW 13.9  LYMPHSABS 0.9  MONOABS 1.1*  EOSABS 0.0  BASOSABS 0.0    Chemistries   Recent Labs Lab 04/30/15 0330  NA 135  K 4.4  CL 99*  CO2 26  GLUCOSE 120*  BUN 19  CREATININE 0.97  CALCIUM 9.6     Urinalysis    Component Value Date/Time   COLORURINE YELLOW 04/30/2015 0609   APPEARANCEUR CLEAR 04/30/2015 0609   LABSPEC 1.016 04/30/2015 0609   PHURINE 6.0 04/30/2015 0609   GLUCOSEU NEGATIVE 04/30/2015 0609   HGBUR TRACE* 04/30/2015 0609   HGBUR moderate 12/11/2008 1104   [place] 04/30/2015 0609   BILIRUBINUR Negative 09/28/2012 [place] 04/30/2015 0609   PROTEINUR NEGATIVE 04/30/2015 0609   PROTEINUR Negative 09/28/2012 1717   UROBILINOGEN 0.2 04/30/2015 0609   UROBILINOGEN 0.2 09/28/2012 1717   NITRITE NEGATIVE 04/30/2015 0609   NITRITE Negative 09/28/2012 1717   LEUKOCYTESUR NEGATIVE 04/30/2015 8127    Imaging results:   Dg Chest 1 View  04/30/2015   CLINICAL DATA:  Fall with right ankle fracture.  Preop.  EXAM: CHEST  1 VIEW  COMPARISON:  06/02/2013  FINDINGS: Chronic elevation of the right diaphragm.  No  cardiomegaly. Negative aortic and hilar contours. There is no edema, consolidation, effusion, or pneumothorax. Left axillary dissection. Cholecystectomy clips. Calcified granulomas over the left lung.  IMPRESSION: 1. No active disease. 2. Chronic elevation of the right diaphragm.   Electronically Signed   By: Monte Fantasia M.D.   On: 04/30/2015 04:26   Dg Ankle Complete Right  04/30/2015   CLINICAL DATA:  Fall with pain in the right ankle.  EXAM: RIGHT ANKLE - COMPLETE 3+ VIEW  COMPARISON:  Initial encounter.  FINDINGS: Transverse medial malleolus fracture and oblique distal fibular fracture that is above the ankle joint. Both fractures are displaced by up to 3 mm laterally. Otherwise, the ankle mortise appears congruent. Intact and normally aligned hindfoot.  IMPRESSION: Mildly displaced medial malleolus and distal fibula fractures.   Electronically Signed   By: Monte Fantasia M.D.   On: 04/30/2015 04:25   Dg Foot Complete Right  04/30/2015   CLINICAL DATA:  Fall with pain of right first toe.  Insert image  EXAM: RIGHT FOOT COMPLETE - 3+ VIEW  COMPARISON:  None.  FINDINGS: There is a nondisplaced fracture involving the distal aspect of the first proximal phalanx adjacent to the second toe and appearing to possibly just extend into the interphalangeal joint. No dislocation. Severe degenerative changes are seen at the first MTP joint with associated hallux valgus. No bony lesions or destruction identified.  IMPRESSION: Nondisplaced fracture involving the distal aspect of the right  first proximal phalanx. Significant degenerative changes of the first MTP joint.   Electronically Signed   By: Aletta Edouard M.D.   On: 04/30/2015 08:04   Dg Hip Unilat With Pelvis 2-3 Views Right  04/30/2015   CLINICAL DATA:  Fall with right ankle and leg pain. Initial encounter.  EXAM: DG HIP (WITH OR WITHOUT PELVIS) 2-3V RIGHT  COMPARISON:  None.  FINDINGS: There is no evidence of hip fracture or dislocation. No evidence of  focal bone lesion. No notable degenerative change.  IMPRESSION: Negative.   Electronically Signed   By: Monte Fantasia M.D.   On: 04/30/2015 04:22    My personal review of EKG: pending    Assessment & Plan  Principal Problem:   Closed right ankle fracture Active Problems:   Pure hypercholesterolemia   Essential hypertension   GERD   Osteoarthritis   BREAST CANCER, HX OF   Constipation   Bimalleolar fracture of right ankle   Bimalleolar fracture of the right ankle Secondary to mechanical fall. Orthopedic surgery has been consultative and plans to do surgical repair on Friday. Will stop aspirin per orthopedic surgery and place on lovenox.  Patient is Non weight bearing on right leg.  Diastolic dysfunction Last echo was 2013 and showed and LVEF of 65 -70.   Continue spironolactone. Check 2-D echo as patient has had some shortness of breath recently. Daily weights, Is & Os, low salt diet.  HTN Continue Cartia  Chronic constipation Continue stool softeners  GERD w/ hx of chronic cough Continue PPI  Restless Leg Continue Ropinirole.   Consultants Called:  Dr. Erlinda Hong, Orthopedics  Family Communication:   None Patient is alert and orientated.  Code Status:  Full  Condition:  Stable  Potential Disposition: SNF   Time spent in minutes : McConnell,  PA-C on 04/30/2015 at 10:22 AM Between 7am to 7pm - Pager - [phone] After 7pm go to [email or web address] - password [email or web address] And look for the night coverage person covering me after hours  [facility]   Addendum  I personally evaluated patient on 04/30/2015 and agree with the above findings. Patient is a pleasant 81 year old female with a history of breast cancer and endometrial cancer, reported being in her usual state health, tripped on day throw rug overnight injuring her right ankle she fell. She denies symptoms prior to her fall. She was unable to vary weight, however, was able to reach the phone and  call her family. On exam she had significant pain with passive and active movement of her ankle. Imaging studies in the emergency room showed nondisplaced fracture involving the distal aspect of the right first proximal phalanx as well as mildly displaced medial malleolus and distal fibular fractures. Orthopedic surgery was consulted.

## 2015-04-30 NOTE — ED Notes (Signed)
Patient transported to X-ray 

## 2015-04-30 NOTE — ED Notes (Signed)
MD at bedside. 

## 2015-04-30 NOTE — ED Notes (Signed)
Took pt off of bedpan; pt rolled from one side to the other with no distress or difficulty; green chuk placed underneath pt; visitor at bedside

## 2015-04-30 NOTE — ED Provider Notes (Addendum)
CSN: [id number]     Arrival date & time 04/30/15  0130 History  This chart was scribed for Varney Biles, MD by Hansel Feinstein, ED Scribe. This patient was seen in room A04C/A04C and the patient's care was started at 2:52 AM.     Chief Complaint  Patient presents with  . Fall   The history is provided by the patient. No language interpreter was used.    HPI Comments: [name] is a 81 y.o. female with Hx of HTN, breast cancer, GERD, borderline DM with hyperglycemia, chronic cough, diverticulosis, basal cell carcinoma, endometrial cancer, uterine cancer, arthritis who presents to the Emergency Department complaining of a mechanical fall that occurred 4 hours PTA at 2300. Per pt, she was walking from the kitchen to the hall and tripped on the edge of the carpet, hitting her right knee, right foot, right hip and the back of her head. Pt states she crawled to the phone but was not able to get up by herself. She states associated moderate right ankle swelling, right ankle pain, right leg pain and right foot pain. Pt states pain is exacerbated with bearing weight. Pt lives by herself. Pt is not on any anticoagulants. She denies LOC. She also denies HA, hip pain.   Past Medical History  Diagnosis Date  . HX: breast cancer 12/99  . HTN (hypertension)   . GERD (gastroesophageal reflux disease)   . Hyperglycemia   . Allergic rhinitis   . Incontinence   . Chronic cough   . Compression fracture   . Diverticulosis   . Hemorrhoids   . Personal history of skin cancer     Basal cell  . Osteoporosis     fosamax in past/ followed by Dr Lorin Mercy   . Endometrial cancer   . Uterine cancer   . Arthritis   . Hematuria   . Diabetes mellitus     borderline   Past Surgical History  Procedure Laterality Date  . Total abdominal hysterectomy w/ bilateral salpingoophorectomy      endometrial cancer  . Mastectomy      Left  . Back surgery  2007    Dr. Lorin Mercy -disk  . Skin cancer excision  10/08    basal  cell  . Cholecystectomy  2009  . Cataract extraction  7/10  . Dexa  2/00    LS osteoporsis  . Dexa  2/02    same; OP, LS, spine, hip  . Dexa  3/04    BMD spine; low BMD hip  . Colonoscopy  3/02    Diverticulosis; hemorrhoids  . Abdominal hysterectomy      TAH BSO  . Dilation and curettage of uterus    . Shoulder surgery    . Carpal tunnel release     Family History  Problem Relation Age of Onset  . Colon cancer Brother   . Breast cancer Sister   . Breast cancer Other    Social History  Substance Use Topics  . Smoking status: Never Smoker   . Smokeless tobacco: Never Used  . Alcohol Use: No   OB History    Gravida Para Term Preterm AB TAB SAB Ectopic Multiple Living   0 0 0 0 0 0 0 0 0 0      Review of Systems  Musculoskeletal: Positive for joint swelling and arthralgias.  Neurological: Negative for headaches.   A complete 10 system review of systems was obtained and all systems are negative except as noted in  the HPI and PMH.   Allergies  Amoxicillin-pot clavulanate; Clarithromycin; Codeine; Sulfonamide derivatives; and Tetracycline  Home Medications   Prior to Admission medications   Medication Sig Start Date End Date Taking? Authorizing Provider  acetaminophen (TYLENOL) 650 MG CR tablet Take 1,300 mg by mouth 2 (two) times daily.     Yes Historical Provider, MD  aspirin EC 81 MG tablet Take 81 mg by mouth daily.   Yes Historical Provider, MD  CALCIUM PO Take 500 mg by mouth 3 (three) times daily.    Yes Historical Provider, MD  CARTIA XT 240 MG 24 hr capsule TAKE 1 CAPSULE EVERY DAY 10/23/14  Yes Abner Greenspan, MD  chlorpheniramine (CHLOR-TRIMETON) 4 MG tablet Take 4 mg by mouth 2 (two) times daily as needed for allergies.   Yes Historical Provider, MD  Cholecalciferol (VITAMIN D PO) Take 800 Units by mouth daily.    Yes Historical Provider, MD  docusate sodium (COLACE) 100 MG capsule Take 100 mg by mouth daily as needed for mild constipation.    Yes Historical  Provider, MD  Multiple Vitamin (MULITIVITAMIN WITH MINERALS) TABS Take 1 tablet by mouth daily.   Yes Historical Provider, MD  omeprazole (PRILOSEC) 40 MG capsule TAKE 1 CAPSULE TWICE DAILY 10/23/14  Yes Abner Greenspan, MD  ondansetron (ZOFRAN) 4 MG tablet Take 4 mg by mouth every 8 (eight) hours as needed for nausea or vomiting.   Yes Historical Provider, MD  rOPINIRole (REQUIP) 1 MG tablet TAKE 1 TABLET EVERY DAY 04/23/15  Yes Abner Greenspan, MD  spironolactone (ALDACTONE) 25 MG tablet TAKE 1 TABLET EVERY DAY 10/23/14  Yes Abner Greenspan, MD  hydrocortisone valerate cream (WESTCORT) 0.2 % Apply 1 application topically daily. Patient not taking: Reported on 04/30/2015 12/13/14   Abner Greenspan, MD  mometasone (ELOCON) 0.1 % cream Apply 1 application topically daily. Apply daily as needed to affected areas Patient not taking: Reported on 04/30/2015 01/01/15   Abner Greenspan, MD  traMADol (ULTRAM) 50 MG tablet Take 1 tablet (50 mg total) by mouth at bedtime as needed for moderate pain. Patient not taking: Reported on 04/30/2015 12/04/13   Abner Greenspan, MD   BP 154/67 mmHg  Pulse 101  Temp(Src) 97.9 F (36.6 C) (Oral)  Resp 19  Ht 5' (1.524 m)  Wt 160 lb (72.576 kg)  BMI 31.25 kg/m2  SpO2 96%  LMP 07/08/1989 Physical Exam  Constitutional: She is oriented to person, place, and time. She appears well-developed and well-nourished.  HENT:  Head: Normocephalic and atraumatic.  No hematoma, abrasion or laceration to the head. No midline c-spine tenderness. Pt has right paraspinal tenderness.   Eyes: Conjunctivae and EOM are normal. Pupils are equal, round, and reactive to light.  Neck: Normal range of motion. Neck supple.  Cardiovascular: Normal rate, regular rhythm and normal heart sounds.   Pulmonary/Chest: Effort normal and breath sounds normal. No respiratory distress.  Lungs CTA.   Abdominal: Soft. She exhibits no distension. There is no tenderness.  Musculoskeletal: Normal range of motion.  No  gross deformity or tenderness to palpation to the upper extremities. Pelvis is stable. Proximal right femur tenderness. Right ankle, there is medial edema and bruising. 2+ dorsalis pedis. No TTP over the lateral and anterior ankle.   Neurological: She is alert and oriented to person, place, and time.  Skin: Skin is warm and dry.  Psychiatric: She has a normal mood and affect. Her behavior is normal.  Nursing note and  vitals reviewed.  ED Course  Procedures (including critical care time) DIAGNOSTIC STUDIES: Oxygen Saturation is 92% on RA, low by my interpretation.    COORDINATION OF CARE: 3:01 AM Discussed treatment plan with pt at bedside and pt agreed to plan.   Labs Review Labs Reviewed  CBC WITH DIFFERENTIAL/PLATELET - Abnormal; Notable for the following:    WBC 15.1 (*)    Neutrophils Relative % 87 (*)    Neutro Abs 13.1 (*)    Lymphocytes Relative 6 (*)    Monocytes Absolute 1.1 (*)    All other components within normal limits  BASIC METABOLIC PANEL - Abnormal; Notable for the following:    Chloride 99 (*)    Glucose, Bld 120 (*)    GFR calc non Af Amer 51 (*)    GFR calc Af Amer 60 (*)    All other components within normal limits  URINALYSIS, ROUTINE W REFLEX MICROSCOPIC (NOT AT [facility]) - Abnormal; Notable for the following:    Hgb urine dipstick TRACE (*)    All other components within normal limits  URINE MICROSCOPIC-ADD ON - Abnormal; Notable for the following:    Squamous Epithelial / LPF FEW (*)    Casts HYALINE CASTS (*)    All other components within normal limits    Imaging Review Dg Chest 1 View  04/30/2015   CLINICAL DATA:  Fall with right ankle fracture.  Preop.  EXAM: CHEST  1 VIEW  COMPARISON:  06/02/2013  FINDINGS: Chronic elevation of the right diaphragm.  No cardiomegaly. Negative aortic and hilar contours. There is no edema, consolidation, effusion, or pneumothorax. Left axillary dissection. Cholecystectomy clips. Calcified granulomas over the left lung.   IMPRESSION: 1. No active disease. 2. Chronic elevation of the right diaphragm.   Electronically Signed   By: Monte Fantasia M.D.   On: 04/30/2015 04:26   Dg Ankle Complete Right  04/30/2015   CLINICAL DATA:  Fall with pain in the right ankle.  EXAM: RIGHT ANKLE - COMPLETE 3+ VIEW  COMPARISON:  Initial encounter.  FINDINGS: Transverse medial malleolus fracture and oblique distal fibular fracture that is above the ankle joint. Both fractures are displaced by up to 3 mm laterally. Otherwise, the ankle mortise appears congruent. Intact and normally aligned hindfoot.  IMPRESSION: Mildly displaced medial malleolus and distal fibula fractures.   Electronically Signed   By: Monte Fantasia M.D.   On: 04/30/2015 04:25   Dg Foot Complete Right  04/30/2015   CLINICAL DATA:  Fall with pain of right first toe.  Insert image  EXAM: RIGHT FOOT COMPLETE - 3+ VIEW  COMPARISON:  None.  FINDINGS: There is a nondisplaced fracture involving the distal aspect of the first proximal phalanx adjacent to the second toe and appearing to possibly just extend into the interphalangeal joint. No dislocation. Severe degenerative changes are seen at the first MTP joint with associated hallux valgus. No bony lesions or destruction identified.  IMPRESSION: Nondisplaced fracture involving the distal aspect of the right first proximal phalanx. Significant degenerative changes of the first MTP joint.   Electronically Signed   By: Aletta Edouard M.D.   On: 04/30/2015 08:04   Dg Hip Unilat With Pelvis 2-3 Views Right  04/30/2015   CLINICAL DATA:  Fall with right ankle and leg pain. Initial encounter.  EXAM: DG HIP (WITH OR WITHOUT PELVIS) 2-3V RIGHT  COMPARISON:  None.  FINDINGS: There is no evidence of hip fracture or dislocation. No evidence of focal bone lesion. No  notable degenerative change.  IMPRESSION: Negative.   Electronically Signed   By: Monte Fantasia M.D.   On: 04/30/2015 04:22   I have personally reviewed and evaluated these  images and lab results as part of my medical decision-making.   EKG Interpretation None      MDM   Final diagnoses:  Fibula fracture, right, closed, initial encounter  Toe pain    I personally performed the services described in this documentation, which was scribed in my presence. The recorded information has been reviewed and is accurate.   Pt comes in post fall. Has a bimalleolar fx. Consulted [facility] at the request of the patient (she has seen Dr. Lorin Mercy). Dr. Nehemiah Settle, medicine prefers being consult if Ortho is to take patient to the OR, so admission orders are pending ortho call back. Pt lives by herself, will need admission regardless for optimal care.   Varney Biles, MD 04/30/15 [place], MD 05/02/15 1341

## 2015-04-30 NOTE — Progress Notes (Signed)
Patient's surgery is scheduled for Friday morning.  I will meet her in the morning and discuss surgery.  She is low to moderate risk.  Appreciate hospitalist assistance.  She will likely need SNF.  Full consult note in the am.

## 2015-04-30 NOTE — Progress Notes (Signed)
[facility] PROGRESS NOTE  [name] BDZ:[id number] DOB: 1927-04-04 DOA: 04/30/2015 PCP: Loura Pardon, MD  Surgical Clearance/Perioperative risk assessment  [name] has a history of hypertension currently on aldactone. During her current hospitalization BPs have been elevated which may be related to pain of current ankle fracture. Review of records show relatively stable BPs, last 3 BPs in OP was 120/78, 124/78, and 136/66. [name] most recent blood work is borderline for diabetes mellitus with hgb A1c at 6.5% (11/2014). Blood glucose today is 120mg /dl. She is not currently on any medications for diabetes.   She endorses a new onset of b/l lower extremity edema that started approximately 1 month ago. Denies pain associated with swelling. She reports a rare history of palpitations, last occurrence was 4-5 years ago. She denies any history of heart disease including myocardial infarction or valvular disorders, stroke, TIA, hyperlipidemia, or bleeding coagulopathies.  She has a history of breast and uterine cancer s/p 10 years since treatment completion.   Today, she denies fever chills, night sweats, chest pain, shortness of breath, abdominal complaints, nausea, vomiting, diarrhea, or constipation.   Cardiac functional status: [name] is independent in her ADLs. She is able to ambulate without the need for assistive devices. She denies any tobacco or alcohol use. She is able to walk on flat surfaces without shortness of breath and is able to climb approximately 12 steps (e.g. 1 flight of stairs) before becoming short of breath.   Creatinine: 0.97 (04/30/15)  2d echo from 01/2012: LVEF 65-70% with grade 1 diastolic dysfunction.   Lyndel Safe preoperative cardiac risk calculator: the patient's estimated risk probability for perioperative MI or cardiac arrest is 0.92%. ([employer])  Given her age and deconditioning, I would consider her at  low to moderate risk for perioperative complications.   Time spent: 20 minutes    Frederica Kuster, PA-Student Imogene Burn, PA-C [facility] Pager [phone]. If 7PM-7AM, please contact night-coverage at [email or web address], password [facility] 04/30/2015, 11:31 AM  LOS: 0 days

## 2015-04-30 NOTE — Progress Notes (Signed)
Orthopedic Tech Progress Note Patient Details:  [name] Jan 27, 1927 [id number]  Ortho Devices Type of Ortho Device: Ace wrap, Post (short leg) splint, Stirrup splint Ortho Device/Splint Location: rle Ortho Device/Splint Interventions: Application   [name] 04/30/2015, 9:48 AM

## 2015-05-01 ENCOUNTER — Inpatient Hospital Stay (HOSPITAL_COMMUNITY): Payer: Medicare Other

## 2015-05-01 ENCOUNTER — Encounter (HOSPITAL_COMMUNITY): Payer: Self-pay | Admitting: General Practice

## 2015-05-01 DIAGNOSIS — Z01810 Encounter for preprocedural cardiovascular examination: Secondary | ICD-10-CM

## 2015-05-01 DIAGNOSIS — S82841D Displaced bimalleolar fracture of right lower leg, subsequent encounter for closed fracture with routine healing: Secondary | ICD-10-CM

## 2015-05-01 DIAGNOSIS — S82891A Other fracture of right lower leg, initial encounter for closed fracture: Secondary | ICD-10-CM

## 2015-05-01 LAB — HEMOGLOBIN A1C
Hgb A1c MFr Bld: 6.4 % — ABNORMAL HIGH (ref 4.8–5.6)
MEAN PLASMA GLUCOSE: 137 mg/dL

## 2015-05-01 LAB — CBC
HEMATOCRIT: 41.7 % (ref 36.0–46.0)
HEMOGLOBIN: 13.9 g/dL (ref 12.0–15.0)
MCH: 31.2 pg (ref 26.0–34.0)
MCHC: 33.3 g/dL (ref 30.0–36.0)
MCV: 93.7 fL (ref 78.0–100.0)
Platelets: 280 10*3/uL (ref 150–400)
RBC: 4.45 MIL/uL (ref 3.87–5.11)
RDW: 13.9 % (ref 11.5–15.5)
WBC: 10.3 10*3/uL (ref 4.0–10.5)

## 2015-05-01 LAB — BASIC METABOLIC PANEL
ANION GAP: 8 (ref 5–15)
BUN: 12 mg/dL (ref 6–20)
CO2: 28 mmol/L (ref 22–32)
Calcium: 9.3 mg/dL (ref 8.9–10.3)
Chloride: 100 mmol/L — ABNORMAL LOW (ref 101–111)
Creatinine, Ser: 0.89 mg/dL (ref 0.44–1.00)
GFR calc Af Amer: >60 mL/min (ref >60)
GFR, EST NON AFRICAN AMERICAN: 57 mL/min — AB (ref >60)
Glucose, Bld: 116 mg/dL — ABNORMAL HIGH (ref 65–99)
POTASSIUM: 4.1 mmol/L (ref 3.5–5.1)
SODIUM: 136 mmol/L (ref 135–145)

## 2015-05-01 LAB — SURGICAL PCR SCREEN
MRSA, PCR: NEGATIVE
STAPHYLOCOCCUS AUREUS: NEGATIVE

## 2015-05-01 MED ORDER — ROPINIROLE HCL 1 MG PO TABS
1.0000 mg | ORAL_TABLET | Freq: Every day | ORAL | Status: DC
  Administered 2015-05-01 – 2015-05-05 (×5): 1 mg via ORAL
  Filled 2015-05-01 (×5): qty 1

## 2015-05-01 NOTE — Progress Notes (Signed)
Utilization review completed. Alicha Raspberry, RN, BSN.q 

## 2015-05-01 NOTE — Consult Note (Signed)
ORTHOPAEDIC CONSULTATION  REQUESTING PHYSICIAN: Hosie Poisson, MD  Chief Complaint: Right ankle fracture  HPI: [name] is a 81 y.o. female who presents with right ankle fx s/p mechanical fall at home.  Lives alone, does not use assistive devices.  Community ambulator at baseline.  Independent with ADLs.  Denies LOC from fall.  Pain is in the ankle and severe with movement, better with immobilization.  Pain does not radiate.  Ortho consulted.  Past Medical History  Diagnosis Date  . HX: breast cancer 12/99  . HTN (hypertension)   . GERD (gastroesophageal reflux disease)   . Hyperglycemia   . Allergic rhinitis   . Incontinence   . Chronic cough   . Compression fracture   . Diverticulosis   . Hemorrhoids   . Personal history of skin cancer     Basal cell  . Osteoporosis     fosamax in past/ followed by Dr Lorin Mercy   . Endometrial cancer   . Uterine cancer   . Arthritis   . Hematuria   . Diabetes mellitus     borderline   Past Surgical History  Procedure Laterality Date  . Total abdominal hysterectomy w/ bilateral salpingoophorectomy      endometrial cancer  . Mastectomy      Left  . Back surgery  2007    Dr. Lorin Mercy -disk  . Skin cancer excision  10/08    basal cell  . Cholecystectomy  2009  . Cataract extraction  7/10  . Dexa  2/00    LS osteoporsis  . Dexa  2/02    same; OP, LS, spine, hip  . Dexa  3/04    BMD spine; low BMD hip  . Colonoscopy  3/02    Diverticulosis; hemorrhoids  . Abdominal hysterectomy      TAH BSO  . Dilation and curettage of uterus    . Shoulder surgery    . Carpal tunnel release     Social History   Social History  . Marital Status: Single    Spouse Name: N/A  . Number of Children: N/A  . Years of Education: N/A   Occupational History  . RETIRED SECRETARY    Social History Main Topics  . Smoking status: Never Smoker   . Smokeless tobacco: Never Used  . Alcohol Use: No  . Drug Use: No  . Sexual Activity: No   Comment: TAH   Other Topics Concern  . None   Social History Narrative   Goes to [employer] 3 days/week for exercise.  Lives at home with a brother for whom she cares.             Family History  Problem Relation Age of Onset  . Colon cancer Brother   . Breast cancer Sister   . Breast cancer Other    Allergies  Allergen Reactions  . Amoxicillin-Pot Clavulanate Nausea Only  . Clarithromycin Nausea Only  . Codeine Nausea Only  . Sulfonamide Derivatives Nausea Only  . Tetracycline Nausea Only   Prior to Admission medications   Medication Sig Start Date End Date Taking? Authorizing Provider  acetaminophen (TYLENOL) 650 MG CR tablet Take 1,300 mg by mouth 2 (two) times daily.     Yes Historical Provider, MD  aspirin EC 81 MG tablet Take 81 mg by mouth daily.   Yes Historical Provider, MD  CALCIUM PO Take 500 mg by mouth 3 (three) times daily.    Yes Historical Provider, MD  Louie Bun  240 MG 24 hr capsule TAKE 1 CAPSULE EVERY DAY 10/23/14  Yes Abner Greenspan, MD  chlorpheniramine (CHLOR-TRIMETON) 4 MG tablet Take 4 mg by mouth 2 (two) times daily as needed for allergies.   Yes Historical Provider, MD  Cholecalciferol (VITAMIN D PO) Take 800 Units by mouth daily.    Yes Historical Provider, MD  docusate sodium (COLACE) 100 MG capsule Take 100 mg by mouth daily as needed for mild constipation.    Yes Historical Provider, MD  Multiple Vitamin (MULITIVITAMIN WITH MINERALS) TABS Take 1 tablet by mouth daily.   Yes Historical Provider, MD  omeprazole (PRILOSEC) 40 MG capsule TAKE 1 CAPSULE TWICE DAILY 10/23/14  Yes Abner Greenspan, MD  ondansetron (ZOFRAN) 4 MG tablet Take 4 mg by mouth every 8 (eight) hours as needed for nausea or vomiting.   Yes Historical Provider, MD  rOPINIRole (REQUIP) 1 MG tablet TAKE 1 TABLET EVERY DAY 04/23/15  Yes Abner Greenspan, MD  spironolactone (ALDACTONE) 25 MG tablet TAKE 1 TABLET EVERY DAY 10/23/14  Yes Abner Greenspan, MD   Dg Chest 1 View  04/30/2015   CLINICAL DATA:   Fall with right ankle fracture.  Preop.  EXAM: CHEST  1 VIEW  COMPARISON:  06/02/2013  FINDINGS: Chronic elevation of the right diaphragm.  No cardiomegaly. Negative aortic and hilar contours. There is no edema, consolidation, effusion, or pneumothorax. Left axillary dissection. Cholecystectomy clips. Calcified granulomas over the left lung.  IMPRESSION: 1. No active disease. 2. Chronic elevation of the right diaphragm.   Electronically Signed   By: Monte Fantasia M.D.   On: 04/30/2015 04:26   Dg Ankle Complete Right  04/30/2015   CLINICAL DATA:  Fall with pain in the right ankle.  EXAM: RIGHT ANKLE - COMPLETE 3+ VIEW  COMPARISON:  Initial encounter.  FINDINGS: Transverse medial malleolus fracture and oblique distal fibular fracture that is above the ankle joint. Both fractures are displaced by up to 3 mm laterally. Otherwise, the ankle mortise appears congruent. Intact and normally aligned hindfoot.  IMPRESSION: Mildly displaced medial malleolus and distal fibula fractures.   Electronically Signed   By: Monte Fantasia M.D.   On: 04/30/2015 04:25   Dg Foot Complete Right  04/30/2015   CLINICAL DATA:  Fall with pain of right first toe.  Insert image  EXAM: RIGHT FOOT COMPLETE - 3+ VIEW  COMPARISON:  None.  FINDINGS: There is a nondisplaced fracture involving the distal aspect of the first proximal phalanx adjacent to the second toe and appearing to possibly just extend into the interphalangeal joint. No dislocation. Severe degenerative changes are seen at the first MTP joint with associated hallux valgus. No bony lesions or destruction identified.  IMPRESSION: Nondisplaced fracture involving the distal aspect of the right first proximal phalanx. Significant degenerative changes of the first MTP joint.   Electronically Signed   By: Aletta Edouard M.D.   On: 04/30/2015 08:04   Dg Hip Unilat With Pelvis 2-3 Views Right  04/30/2015   CLINICAL DATA:  Fall with right ankle and leg pain. Initial encounter.   EXAM: DG HIP (WITH OR WITHOUT PELVIS) 2-3V RIGHT  COMPARISON:  None.  FINDINGS: There is no evidence of hip fracture or dislocation. No evidence of focal bone lesion. No notable degenerative change.  IMPRESSION: Negative.   Electronically Signed   By: Monte Fantasia M.D.   On: 04/30/2015 04:22    Positive ROS: All other systems have been reviewed and were otherwise negative with  the exception of those mentioned in the HPI and as above.  Physical Exam: General: Alert, no acute distress Cardiovascular: No pedal edema Respiratory: No cyanosis, no use of accessory musculature GI: No organomegaly, abdomen is soft and non-tender Skin: No lesions in the area of chief complaint Neurologic: Sensation intact distally Psychiatric: Patient is competent for consent with normal mood and affect Lymphatic: No axillary or cervical lymphadenopathy  MUSCULOSKELETAL:  - well fitting splint - toes wwp, wiggling - sensation intact in toes  Assessment: 1. Right closed bimal ankle fx 2. Right great toe fx  Plan: - great toe fx nonop - patient is very active and would benefit from ORIF of ankle to return to desired activity level - she is aware of r/b/a to surgery and wishes to proceed - consent signed - surgery tomorrow - NPO after midnight  Thank you for the consult and the opportunity to see [name]  N. Eduard Roux, MD [place] 7:32 AM

## 2015-05-01 NOTE — Progress Notes (Signed)
  Echocardiogram 2D Echocardiogram has been performed.  [name] 05/01/2015, 1:14 PM

## 2015-05-01 NOTE — H&P (Signed)

## 2015-05-01 NOTE — Progress Notes (Signed)
[facility] PROGRESS NOTE  [name] [id number] DOB: 11/23/1926 DOA: 04/30/2015 PCP: Loura Pardon, MD  Assessment/Plan: 1. Right ankle fracture: - for OR in am.  - pain control. Npo after midnight.  Holding aspirin and on lovenox.    Chronic diastolic heart failure: Compensated.  Echocardiogram ordered.    Hypertension  Controlled.     Code Status: full code.  Family Communication: none at bedside Disposition Plan: pending.    Consultants:  Orthopedics.   Procedures:  Scheduled for oR in am.   Antibiotics:  none  HPI/Subjective: Pain controlled.   Objective: Filed Vitals:   05/01/15 0600  BP: 148/58  Pulse: 98  Temp: 98.4 F (36.9 C)  Resp: 19    Intake/Output Summary (Last 24 hours) at 05/01/15 1223 Last data filed at 05/01/15 0900  Gross per 24 hour  Intake    230 ml  Output      0 ml  Net    230 ml   Filed Weights   04/30/15 0211  Weight: 72.576 kg (160 lb)    Exam:   General:  Alert afebrile comfortable  Cardiovascular: s1s2  Respiratory: ctab  Abdomen: soft NT ND BS+  Musculoskeletal: right ankle splint.   Data Reviewed: Basic Metabolic Panel:  Recent Labs Lab 04/30/15 0330 05/01/15 0655  NA 135 136  K 4.4 4.1  CL 99* 100*  CO2 26 28  GLUCOSE 120* 116*  BUN 19 12  CREATININE 0.97 0.89  CALCIUM 9.6 9.3   Liver Function Tests: No results for input(s): AST, ALT, ALKPHOS, BILITOT, PROT, ALBUMIN in the last 168 hours. No results for input(s): LIPASE, AMYLASE in the last 168 hours. No results for input(s): AMMONIA in the last 168 hours. CBC:  Recent Labs Lab 04/30/15 0330 05/01/15 0655  WBC 15.1* 10.3  NEUTROABS 13.1*  --   HGB 13.5 13.9  HCT 39.5 41.7  MCV 91.6 93.7  PLT 311 280   Cardiac Enzymes: No results for input(s): CKTOTAL, CKMB, CKMBINDEX, TROPONINI in the last 168 hours. BNP (last 3 results) No results for input(s): BNP in the last 8760 hours.  ProBNP (last 3 results) No results  for input(s): PROBNP in the last 8760 hours.  CBG: No results for input(s): GLUCAP in the last 168 hours.  Recent Results (from the past 240 hour(s))  Urine culture     Status: None (Preliminary result)   Collection Time: 04/30/15  6:09 AM  Result Value Ref Range Status   Specimen Description URINE, CLEAN CATCH  Final   Special Requests NONE  Final   Culture CULTURE REINCUBATED FOR BETTER GROWTH  Final   Report Status PENDING  Incomplete  Surgical pcr screen     Status: None   Collection Time: 05/01/15 10:02 AM  Result Value Ref Range Status   MRSA, PCR NEGATIVE NEGATIVE Final   Staphylococcus aureus NEGATIVE NEGATIVE Final    Comment:        The Xpert SA Assay (FDA approved for NASAL specimens in patients over 52 years of age), is one component of a comprehensive surveillance program.  Test performance has been validated by [facility] for patients greater than or equal to 41 year old. It is not intended to diagnose infection nor to guide or monitor treatment.      Studies: Dg Chest 1 View  04/30/2015   CLINICAL DATA:  Fall with right ankle fracture.  Preop.  EXAM: CHEST  1 VIEW  COMPARISON:  06/02/2013  FINDINGS: Chronic elevation  of the right diaphragm.  No cardiomegaly. Negative aortic and hilar contours. There is no edema, consolidation, effusion, or pneumothorax. Left axillary dissection. Cholecystectomy clips. Calcified granulomas over the left lung.  IMPRESSION: 1. No active disease. 2. Chronic elevation of the right diaphragm.   Electronically Signed   By: Monte Fantasia M.D.   On: 04/30/2015 04:26   Dg Ankle Complete Right  04/30/2015   CLINICAL DATA:  Fall with pain in the right ankle.  EXAM: RIGHT ANKLE - COMPLETE 3+ VIEW  COMPARISON:  Initial encounter.  FINDINGS: Transverse medial malleolus fracture and oblique distal fibular fracture that is above the ankle joint. Both fractures are displaced by up to 3 mm laterally. Otherwise, the ankle mortise appears  congruent. Intact and normally aligned hindfoot.  IMPRESSION: Mildly displaced medial malleolus and distal fibula fractures.   Electronically Signed   By: Monte Fantasia M.D.   On: 04/30/2015 04:25   Dg Foot Complete Right  04/30/2015   CLINICAL DATA:  Fall with pain of right first toe.  Insert image  EXAM: RIGHT FOOT COMPLETE - 3+ VIEW  COMPARISON:  None.  FINDINGS: There is a nondisplaced fracture involving the distal aspect of the first proximal phalanx adjacent to the second toe and appearing to possibly just extend into the interphalangeal joint. No dislocation. Severe degenerative changes are seen at the first MTP joint with associated hallux valgus. No bony lesions or destruction identified.  IMPRESSION: Nondisplaced fracture involving the distal aspect of the right first proximal phalanx. Significant degenerative changes of the first MTP joint.   Electronically Signed   By: Aletta Edouard M.D.   On: 04/30/2015 08:04   Dg Hip Unilat With Pelvis 2-3 Views Right  04/30/2015   CLINICAL DATA:  Fall with right ankle and leg pain. Initial encounter.  EXAM: DG HIP (WITH OR WITHOUT PELVIS) 2-3V RIGHT  COMPARISON:  None.  FINDINGS: There is no evidence of hip fracture or dislocation. No evidence of focal bone lesion. No notable degenerative change.  IMPRESSION: Negative.   Electronically Signed   By: Monte Fantasia M.D.   On: 04/30/2015 04:22    Scheduled Meds: . acetaminophen  650 mg Oral QID  . cholecalciferol  800 Units Oral Daily  . diltiazem  240 mg Oral Daily  . docusate sodium  100 mg Oral BID  . multivitamin with minerals  1 tablet Oral Daily  . pantoprazole  40 mg Oral Daily  . rOPINIRole  1 mg Oral Daily  . sodium chloride  3 mL Intravenous Q12H  . spironolactone  25 mg Oral Daily   Continuous Infusions:   Principal Problem:   Closed right ankle fracture Active Problems:   Pure hypercholesterolemia   Essential hypertension   GERD   Osteoarthritis   BREAST CANCER, HX OF    Constipation   Bimalleolar fracture of right ankle    Time spent: 25 minutes.     [facility] [facility] Pager [phone] . If 7PM-7AM, please contact night-coverage at [email or web address], password [facility] 05/01/2015, 12:23 PM  LOS: 1 day

## 2015-05-01 NOTE — Clinical Social Work Note (Signed)
Disposition: Pending until after surgery, but projection is SNF.    Patient would like to wait until after she sees how well she will do after surgery before discussing SNF option.  Patient is scheduled for surgery tomorrow (Friday 05/02/2015) at 11:30am per RN report.  CSW will continue to follow and assist with disposition until clarified by PT.  Of note, payer source is Regular [employer] and will require a 3 midnight qualifying stay for SNF placement.    Nonnie Done, LCSW [phone]  Psychiatric & Orthopedics (5N 1-8) Clinical Social Worker

## 2015-05-02 ENCOUNTER — Encounter (HOSPITAL_COMMUNITY): Payer: Self-pay | Admitting: Anesthesiology

## 2015-05-02 ENCOUNTER — Inpatient Hospital Stay (HOSPITAL_COMMUNITY): Payer: Medicare Other | Admitting: Anesthesiology

## 2015-05-02 ENCOUNTER — Encounter (HOSPITAL_COMMUNITY)
Admission: EM | Disposition: A | Discharge status: Skilled Nursing Facility | Payer: Self-pay | Source: Home / Self Care | Attending: Internal Medicine

## 2015-05-02 ENCOUNTER — Inpatient Hospital Stay (HOSPITAL_COMMUNITY): Payer: Medicare Other

## 2015-05-02 DIAGNOSIS — S82891D Other fracture of right lower leg, subsequent encounter for closed fracture with routine healing: Secondary | ICD-10-CM

## 2015-05-02 HISTORY — PX: ORIF ANKLE FRACTURE: SHX5408

## 2015-05-02 LAB — GLUCOSE, CAPILLARY: Glucose-Capillary: 105 mg/dL — ABNORMAL HIGH (ref 65–99)

## 2015-05-02 LAB — URINE CULTURE

## 2015-05-02 SURGERY — OPEN REDUCTION INTERNAL FIXATION (ORIF) ANKLE FRACTURE
Anesthesia: Regional | Site: Ankle | Laterality: Right

## 2015-05-02 MED ORDER — CEFAZOLIN SODIUM-DEXTROSE 2-3 GM-% IV SOLR
INTRAVENOUS | Status: DC | PRN
  Administered 2015-05-02: 2 g via INTRAVENOUS

## 2015-05-02 MED ORDER — DIPHENHYDRAMINE HCL 12.5 MG/5ML PO ELIX: 25.0000 mg | ORAL_SOLUTION | ORAL | Status: DC | PRN

## 2015-05-02 MED ORDER — ONDANSETRON HCL 4 MG PO TABS: 4.0000 mg | ORAL_TABLET | Freq: Four times a day (QID) | ORAL | Status: DC | PRN

## 2015-05-02 MED ORDER — ONDANSETRON HCL 4 MG/2ML IJ SOLN
4.0000 mg | Freq: Four times a day (QID) | INTRAMUSCULAR | Status: DC | PRN
  Administered 2015-05-03: 4 mg via INTRAVENOUS
  Filled 2015-05-02: qty 2

## 2015-05-02 MED ORDER — HYDROMORPHONE HCL 1 MG/ML IJ SOLN: 0.2500 mg | INTRAMUSCULAR | Status: DC | PRN

## 2015-05-02 MED ORDER — LACTATED RINGERS IV SOLN
INTRAVENOUS | Status: DC
  Administered 2015-05-02 (×2): via INTRAVENOUS

## 2015-05-02 MED ORDER — MIDAZOLAM HCL 2 MG/2ML IJ SOLN
INTRAMUSCULAR | Status: AC
  Administered 2015-05-02: 1 mg via INTRAVENOUS
  Filled 2015-05-02: qty 2

## 2015-05-02 MED ORDER — METOCLOPRAMIDE HCL 5 MG/ML IJ SOLN
5.0000 mg | Freq: Three times a day (TID) | INTRAMUSCULAR | Status: DC | PRN
  Administered 2015-05-03 – 2015-05-04 (×2): 10 mg via INTRAVENOUS
  Filled 2015-05-02 (×2): qty 2

## 2015-05-02 MED ORDER — SODIUM CHLORIDE 0.9 % IV SOLN
INTRAVENOUS | Status: DC
  Administered 2015-05-02: 22:00:00 via INTRAVENOUS

## 2015-05-02 MED ORDER — 0.9 % SODIUM CHLORIDE (POUR BTL) OPTIME
TOPICAL | Status: DC | PRN
  Administered 2015-05-02: 1000 mL

## 2015-05-02 MED ORDER — ONDANSETRON HCL 4 MG/2ML IJ SOLN
INTRAMUSCULAR | Status: DC | PRN
  Administered 2015-05-02: 4 mg via INTRAVENOUS

## 2015-05-02 MED ORDER — CEFAZOLIN SODIUM-DEXTROSE 2-3 GM-% IV SOLR
2.0000 g | Freq: Four times a day (QID) | INTRAVENOUS | Status: AC
  Administered 2015-05-02 – 2015-05-03 (×3): 2 g via INTRAVENOUS
  Filled 2015-05-02 (×3): qty 50

## 2015-05-02 MED ORDER — BUPIVACAINE-EPINEPHRINE (PF) 0.5% -1:200000 IJ SOLN
INTRAMUSCULAR | Status: DC | PRN
  Administered 2015-05-02: 25 mL via PERINEURAL
  Administered 2015-05-02: 15 mL via PERINEURAL

## 2015-05-02 MED ORDER — ACETAMINOPHEN 325 MG PO TABS: 650.0000 mg | ORAL_TABLET | Freq: Four times a day (QID) | ORAL | Status: DC | PRN

## 2015-05-02 MED ORDER — ASPIRIN EC 325 MG PO TBEC
325.0000 mg | DELAYED_RELEASE_TABLET | Freq: Two times a day (BID) | ORAL | Status: DC
  Administered 2015-05-02 – 2015-05-06 (×6): 325 mg via ORAL
  Filled 2015-05-02 (×6): qty 1

## 2015-05-02 MED ORDER — METOCLOPRAMIDE HCL 5 MG PO TABS: 5.0000 mg | ORAL_TABLET | Freq: Three times a day (TID) | ORAL | Status: DC | PRN

## 2015-05-02 MED ORDER — LIDOCAINE HCL (CARDIAC) 20 MG/ML IV SOLN
INTRAVENOUS | Status: DC | PRN
  Administered 2015-05-02: 130 mg via INTRAVENOUS

## 2015-05-02 MED ORDER — FENTANYL CITRATE (PF) 250 MCG/5ML IJ SOLN
INTRAMUSCULAR | Status: AC
  Filled 2015-05-02: qty 5

## 2015-05-02 MED ORDER — EPHEDRINE SULFATE 50 MG/ML IJ SOLN
INTRAMUSCULAR | Status: AC
  Filled 2015-05-02: qty 1

## 2015-05-02 MED ORDER — FENTANYL CITRATE (PF) 100 MCG/2ML IJ SOLN
INTRAMUSCULAR | Status: AC
  Administered 2015-05-02: 50 ug via INTRAVENOUS
  Filled 2015-05-02: qty 2

## 2015-05-02 MED ORDER — ACETAMINOPHEN 650 MG RE SUPP: 650.0000 mg | Freq: Four times a day (QID) | RECTAL | Status: DC | PRN

## 2015-05-02 MED ORDER — OXYCODONE HCL 5 MG/5ML PO SOLN: 5.0000 mg | Freq: Once | ORAL | Status: DC | PRN

## 2015-05-02 MED ORDER — PROMETHAZINE HCL 25 MG/ML IJ SOLN: 6.2500 mg | INTRAMUSCULAR | Status: DC | PRN

## 2015-05-02 MED ORDER — OXYCODONE HCL 5 MG PO TABS: 5.0000 mg | ORAL_TABLET | Freq: Once | ORAL | Status: DC | PRN

## 2015-05-02 MED ORDER — FENTANYL CITRATE (PF) 100 MCG/2ML IJ SOLN
INTRAMUSCULAR | Status: DC | PRN
Start: 2015-05-02 — End: 2015-05-02
  Administered 2015-05-02 (×2): 50 ug via INTRAVENOUS

## 2015-05-02 MED ORDER — SODIUM CHLORIDE 0.9 % IJ SOLN
INTRAMUSCULAR | Status: AC
  Filled 2015-05-02: qty 10

## 2015-05-02 SURGICAL SUPPLY — 69 items
BANDAGE ELASTIC 4 VELCRO ST LF (GAUZE/BANDAGES/DRESSINGS) ×3 IMPLANT
BANDAGE ELASTIC 6 VELCRO ST LF (GAUZE/BANDAGES/DRESSINGS) ×3 IMPLANT
BANDAGE ESMARK 6X9 LF (GAUZE/BANDAGES/DRESSINGS) ×1 IMPLANT
BIT DRILL CANN 2.7 (BIT) ×1
BIT DRILL CANN 2.7MM (BIT) ×1
BIT DRILL SRG 2.7XCANN AO CPLG (BIT) ×1 IMPLANT
BIT DRL SRG 2.7XCANN AO CPLNG (BIT) ×1
BLADE SURG 15 STRL LF DISP TIS (BLADE) ×1 IMPLANT
BLADE SURG 15 STRL SS (BLADE) ×2
BNDG COHESIVE 4X5 TAN STRL (GAUZE/BANDAGES/DRESSINGS) ×3 IMPLANT
BNDG COHESIVE 6X5 TAN STRL LF (GAUZE/BANDAGES/DRESSINGS) IMPLANT
BNDG ESMARK 6X9 LF (GAUZE/BANDAGES/DRESSINGS) ×3
BNDG GAUZE ELAST 4 BULKY (GAUZE/BANDAGES/DRESSINGS) ×6 IMPLANT
CANISTER SUCT 3000ML PPV (MISCELLANEOUS) ×3 IMPLANT
COVER SURGICAL LIGHT HANDLE (MISCELLANEOUS) ×3 IMPLANT
CUFF TOURNIQUET SINGLE 34IN LL (TOURNIQUET CUFF) ×3 IMPLANT
CUFF TOURNIQUET SINGLE 44IN (TOURNIQUET CUFF) IMPLANT
DRAPE C-ARM 42X72 X-RAY (DRAPES) ×3 IMPLANT
DRAPE C-ARMOR (DRAPES) ×3 IMPLANT
DRAPE IMP U-DRAPE 54X76 (DRAPES) ×3 IMPLANT
DRAPE INCISE IOBAN 66X45 STRL (DRAPES) IMPLANT
DRAPE U-SHAPE 47X51 STRL (DRAPES) ×3 IMPLANT
DRILL 2.6X122MM WL AO SHAFT (BIT) ×3 IMPLANT
DRSG PAD ABDOMINAL 8X10 ST (GAUZE/BANDAGES/DRESSINGS) ×6 IMPLANT
DURAPREP 26ML APPLICATOR (WOUND CARE) ×3 IMPLANT
ELECT CAUTERY BLADE 6.4 (BLADE) ×3 IMPLANT
ELECT REM PT RETURN 9FT ADLT (ELECTROSURGICAL) ×3
ELECTRODE REM PT RTRN 9FT ADLT (ELECTROSURGICAL) ×1 IMPLANT
FACESHIELD WRAPAROUND (MASK) ×3 IMPLANT
GAUZE SPONGE 4X4 12PLY STRL (GAUZE/BANDAGES/DRESSINGS) ×3 IMPLANT
GAUZE XEROFORM 5X9 LF (GAUZE/BANDAGES/DRESSINGS) ×3 IMPLANT
GLOVE NEODERM STRL 7.5 LF PF (GLOVE) ×1 IMPLANT
GLOVE SURG NEODERM 7.5  LF PF (GLOVE) ×2
GLOVE SURG SYN 7.5  E (GLOVE) ×4
GLOVE SURG SYN 7.5 E (GLOVE) ×2 IMPLANT
GOWN STRL REIN XL XLG (GOWN DISPOSABLE) ×3 IMPLANT
K-WIRE ORTHOPEDIC 1.4X150L (WIRE) ×6
KIT BASIN OR (CUSTOM PROCEDURE TRAY) ×3 IMPLANT
KIT ROOM TURNOVER OR (KITS) ×3 IMPLANT
KWIRE ORTHOPEDIC 1.4X150L (WIRE) ×2 IMPLANT
NEEDLE HYPO 25GX1X1/2 BEV (NEEDLE) IMPLANT
NS IRRIG 1000ML POUR BTL (IV SOLUTION) ×3 IMPLANT
PACK ORTHO EXTREMITY (CUSTOM PROCEDURE TRAY) ×3 IMPLANT
PAD ARMBOARD 7.5X6 YLW CONV (MISCELLANEOUS) ×3 IMPLANT
PAD CAST 3X4 CTTN HI CHSV (CAST SUPPLIES) IMPLANT
PAD CAST 4YDX4 CTTN HI CHSV (CAST SUPPLIES) ×2 IMPLANT
PADDING CAST COTTON 3X4 STRL (CAST SUPPLIES)
PADDING CAST COTTON 4X4 STRL (CAST SUPPLIES) ×4
PADDING CAST COTTON 6X4 STRL (CAST SUPPLIES) IMPLANT
PLATE 10H 132MM (Plate) ×3 IMPLANT
SCREW BONE 18 (Screw) ×3 IMPLANT
SCREW CANNULATED 4.0X36MM PT (Screw) ×3 IMPLANT
SCREW LOCK 3.5X14 (Screw) ×3 IMPLANT
SCREW LOCKING 3.5X12 (Screw) ×9 IMPLANT
SCREW LOCKING 3.5X18MM (Screw) ×3 IMPLANT
SPLINT FIBERGLASS 4X30 (CAST SUPPLIES) ×3 IMPLANT
SPONGE LAP 18X18 X RAY DECT (DISPOSABLE) IMPLANT
SUCTION FRAZIER TIP 10 FR DISP (SUCTIONS) ×3 IMPLANT
SUT ETHILON 3 0 PS 1 (SUTURE) ×6 IMPLANT
SUT VIC AB 0 CT1 27 (SUTURE) ×2
SUT VIC AB 0 CT1 27XBRD ANBCTR (SUTURE) ×1 IMPLANT
SUT VIC AB 2-0 CT1 27 (SUTURE) ×4
SUT VIC AB 2-0 CT1 TAPERPNT 27 (SUTURE) ×2 IMPLANT
SYR CONTROL 10ML LL (SYRINGE) IMPLANT
TOWEL OR 17X24 6PK STRL BLUE (TOWEL DISPOSABLE) ×3 IMPLANT
TOWEL OR 17X26 10 PK STRL BLUE (TOWEL DISPOSABLE) ×6 IMPLANT
TUBE CONNECTING 12'X1/4 (SUCTIONS) ×1
TUBE CONNECTING 12X1/4 (SUCTIONS) ×2 IMPLANT
WATER STERILE IRR 1000ML POUR (IV SOLUTION) IMPLANT

## 2015-05-02 NOTE — Discharge Instructions (Signed)
° ° °  1. Keep splint clean and dry °2. Elevate foot above level of the heart °3. Take aspirin to prevent blood clots °4. Take pain meds as needed °5. Strict non weight bearing to operative extremity ° °

## 2015-05-02 NOTE — Brief Op Note (Signed)
   Brief Op Note  Date of Surgery: 05/02/2015  Preoperative Diagnosis: Right Bimall ankle Fx  Postoperative Diagnosis: same  Procedure: Procedure(s): OPEN REDUCTION INTERNAL FIXATION (ORIF) ANKLE FRACTURE/BIMALL  Implants: Stryker  Surgeons: Surgeon(s): Naiping Ephriam Jenkins, MD  Anesthesia: General  Drains: none  Estimated Blood Loss: See anesthesia record  Complications: None  Condition to PACU: Stable  Naiping Eduard Roux, MD [place] 05/02/2015 1:12 PM

## 2015-05-02 NOTE — Anesthesia Procedure Notes (Addendum)
Procedure Name: LMA Insertion Date/Time: 05/02/2015 12:19 PM Performed by: Clearnce Sorrel Pre-anesthesia Checklist: Patient identified, Emergency Drugs available, Suction available, Patient being monitored and Timeout performed Patient Re-evaluated:Patient Re-evaluated prior to inductionOxygen Delivery Method: Circle system utilized Preoxygenation: Pre-oxygenation with 100% oxygen Intubation Type: IV induction LMA: LMA inserted LMA Size: 4.0 Number of attempts: 1 Placement Confirmation: ETT inserted through vocal cords under direct vision,  positive ETCO2 and breath sounds checked- equal and bilateral Tube secured with: Tape Dental Injury: Teeth and Oropharynx as per pre-operative assessment    Anesthesia Regional Block:  Adductor canal block  Pre-Anesthetic Checklist: ,, timeout performed, Correct Patient, Correct Site, Correct Laterality, Correct Procedure, Correct Position, site marked, Risks and benefits discussed,  Surgical consent,  Pre-op evaluation,  At surgeon's request and post-op pain management  Laterality: Right  Prep: chloraprep       Needles:  Injection technique: Single-shot  Needle Type: Echogenic Needle     Needle Length: 9cm 9 cm Needle Gauge: 21 and 21 G    Additional Needles:  Procedures: ultrasound guided (picture in chart) Adductor canal block Narrative:  Start time: 05/02/2015 11:35 AM End time: 05/02/2015 11:42 AM Injection made incrementally with aspirations every 5 mL.  Performed by: Personally  Anesthesiologist: Suzette Battiest   Anesthesia Regional Block:  Popliteal block  Pre-Anesthetic Checklist: ,, timeout performed, Correct Patient, Correct Site, Correct Laterality, Correct Procedure, Correct Position, site marked, Risks and benefits discussed,  Surgical consent,  Pre-op evaluation,  At surgeon's request and post-op pain management  Laterality: Right  Prep: chloraprep       Needles:  Injection technique: Single-shot  Needle Type:  Echogenic Needle     Needle Length: 9cm 9 cm Needle Gauge: 21 and 21 G    Additional Needles:  Procedures: ultrasound guided (picture in chart) Popliteal block Narrative:  Start time: 05/02/2015 11:43 AM End time: 05/02/2015 11:50 AM Injection made incrementally with aspirations every 5 mL.  Performed by: Personally  Anesthesiologist: Suzette Battiest

## 2015-05-02 NOTE — Transfer of Care (Signed)
Immediate Anesthesia Transfer of Care Note  Patient: [name]  Procedure(s) Performed: Procedure(s): OPEN REDUCTION INTERNAL FIXATION (ORIF) ANKLE FRACTURE/BIMALL (Right)  Patient Location: PACU  Anesthesia Type:General and Regional  Level of Consciousness: awake, alert  and oriented  Airway & Oxygen Therapy: Patient Spontanous Breathing and Patient connected to face mask oxygen  Post-op Assessment: Report given to RN and Post -op Vital signs reviewed and stable  Post vital signs: Reviewed and stable  Last Vitals:  Filed Vitals:   05/02/15 1200  BP: 136/47  Pulse: 109  Temp:   Resp: 20    Complications: No apparent anesthesia complications

## 2015-05-02 NOTE — Anesthesia Preprocedure Evaluation (Addendum)
Anesthesia Evaluation  Patient identified by MRN, date of birth, ID band Patient awake    Reviewed: Allergy & Precautions, NPO status , Patient's Chart, lab work & pertinent test results  Airway Mallampati: II  TM Distance: >3 FB Neck ROM: Full    Dental   Pulmonary shortness of breath,  breath sounds clear to auscultation        Cardiovascular hypertension, Pt. on medications Rhythm:Regular Rate:Normal     Neuro/Psych negative neurological ROS     GI/Hepatic Neg liver ROS, GERD-  ,  Endo/Other  diabetes, Type 2  Renal/GU negative Renal ROS     Musculoskeletal  (+) Arthritis -,   Abdominal   Peds  Hematology negative hematology ROS (+)   Anesthesia Other Findings   Reproductive/Obstetrics                            Anesthesia Physical Anesthesia Plan  ASA: III  Anesthesia Plan: General and Regional   Post-op Pain Management: GA combined w/ Regional for post-op pain   Induction: Intravenous  Airway Management Planned: LMA  Additional Equipment:   Intra-op Plan:   Post-operative Plan: Extubation in OR  Informed Consent: I have reviewed the patients History and Physical, chart, labs and discussed the procedure including the risks, benefits and alternatives for the proposed anesthesia with the patient or authorized representative who has indicated his/her understanding and acceptance.   Dental advisory given  Plan Discussed with: CRNA  Anesthesia Plan Comments:         Anesthesia Quick Evaluation

## 2015-05-02 NOTE — Progress Notes (Signed)
[facility] PROGRESS NOTE  Rollande Thursby WJX:[id number] DOB: [birth year] DOA: 04/30/2015 PCP: Loura Pardon, MD  Assessment/Plan: 1. Right ankle fracture: - s/p ORIF TODAY..  - pain control. Npo after midnight.  Holding aspirin and on lovenox.    Chronic diastolic heart failure: Compensated.  Echocardiogram ordered.    Hypertension  Controlled.     Code Status: full code.  Family Communication: none at bedside Disposition Plan: pending.    Consultants:  Orthopedics.   Procedures:  Scheduled for oR in am.   Antibiotics:  none  HPI/Subjective: Pain controlled.   Objective: Filed Vitals:   05/02/15 1552  BP: 127/48  Pulse: 94  Temp: 98.6 F (37 C)  Resp: 18    Intake/Output Summary (Last 24 hours) at 05/02/15 1913 Last data filed at 05/02/15 1545  Gross per 24 hour  Intake    600 ml  Output    200 ml  Net    400 ml   Filed Weights   04/30/15 0211 05/02/15 0500  Weight: 72.576 kg (160 lb) 79.47 kg (175 lb 3.2 oz)    Exam:   General:  Alert afebrile comfortable  Cardiovascular: s1s2  Respiratory: ctab  Abdomen: soft NT ND BS+  Musculoskeletal: right ankle splint.   Data Reviewed: Basic Metabolic Panel:  Recent Labs Lab 04/30/15 0330 05/01/15 0655  NA 135 136  K 4.4 4.1  CL 99* 100*  CO2 26 28  GLUCOSE 120* 116*  BUN 19 12  CREATININE 0.97 0.89  CALCIUM 9.6 9.3   Liver Function Tests: No results for input(s): AST, ALT, ALKPHOS, BILITOT, PROT, ALBUMIN in the last 168 hours. No results for input(s): LIPASE, AMYLASE in the last 168 hours. No results for input(s): AMMONIA in the last 168 hours. CBC:  Recent Labs Lab 04/30/15 0330 05/01/15 0655  WBC 15.1* 10.3  NEUTROABS 13.1*  --   HGB 13.5 13.9  HCT 39.5 41.7  MCV 91.6 93.7  PLT 311 280   Cardiac Enzymes: No results for input(s): CKTOTAL, CKMB, CKMBINDEX, TROPONINI in the last 168 hours. BNP (last 3 results) No results for input(s): BNP in the last 8760  hours.  ProBNP (last 3 results) No results for input(s): PROBNP in the last 8760 hours.  CBG:  Recent Labs Lab 05/02/15 1401  GLUCAP 105*    Recent Results (from the past 240 hour(s))  Urine culture     Status: None   Collection Time: 04/30/15  6:09 AM  Result Value Ref Range Status   Specimen Description URINE, CLEAN CATCH  Final   Special Requests NONE  Final   Culture MULTIPLE SPECIES PRESENT, SUGGEST RECOLLECTION  Final   Report Status 05/02/2015 FINAL  Final  Surgical pcr screen     Status: None   Collection Time: 05/01/15 10:02 AM  Result Value Ref Range Status   MRSA, PCR NEGATIVE NEGATIVE Final   Staphylococcus aureus NEGATIVE NEGATIVE Final    Comment:        The Xpert SA Assay (FDA approved for NASAL specimens in patients over 37 years of age), is one component of a comprehensive surveillance program.  Test performance has been validated by [facility] for patients greater than or equal to 91 year old. It is not intended to diagnose infection nor to guide or monitor treatment.      Studies: Dg Ankle Complete Right  05/02/2015   CLINICAL DATA:  ORIF of distal tibia and fibular fractures  EXAM: DG C-ARM 61-120 MIN; RIGHT ANKLE -  COMPLETE 3+ VIEW  COMPARISON:  None.  FLUOROSCOPY TIME:  Radiation Exposure Index (as provided by the fluoroscopic device): Not available  If the device does not provide the exposure index:  Fluoroscopy Time:  53 seconds  Number of Acquired Images:  4  FINDINGS: Fixation sideplate is noted along the distal fibula with a fixation screw traversing the medial malleolus. Fracture fragments are in anatomic alignment.  IMPRESSION: Status post ORIF of distal tibial and fibular fractures.   Electronically Signed   By: Inez Catalina M.D.   On: 05/02/2015 13:42   Dg C-arm 1-60 Min  05/02/2015   CLINICAL DATA:  ORIF of distal tibia and fibular fractures  EXAM: DG C-ARM 61-120 MIN; RIGHT ANKLE - COMPLETE 3+ VIEW  COMPARISON:  None.  FLUOROSCOPY TIME:   Radiation Exposure Index (as provided by the fluoroscopic device): Not available  If the device does not provide the exposure index:  Fluoroscopy Time:  53 seconds  Number of Acquired Images:  4  FINDINGS: Fixation sideplate is noted along the distal fibula with a fixation screw traversing the medial malleolus. Fracture fragments are in anatomic alignment.  IMPRESSION: Status post ORIF of distal tibial and fibular fractures.   Electronically Signed   By: Inez Catalina M.D.   On: 05/02/2015 13:42    Scheduled Meds: . acetaminophen  650 mg Oral QID  . aspirin EC  325 mg Oral BID  .  ceFAZolin (ANCEF) IV  2 g Intravenous Q6H  . cholecalciferol  800 Units Oral Daily  . diltiazem  240 mg Oral Daily  . docusate sodium  100 mg Oral BID  . multivitamin with minerals  1 tablet Oral Daily  . pantoprazole  40 mg Oral Daily  . rOPINIRole  1 mg Oral QHS  . sodium chloride  3 mL Intravenous Q12H  . spironolactone  25 mg Oral Daily   Continuous Infusions: . sodium chloride    . lactated ringers 50 mL/hr at 05/02/15 1103    Principal Problem:   Closed right ankle fracture Active Problems:   Pure hypercholesterolemia   Essential hypertension   GERD   Osteoarthritis   BREAST CANCER, HX OF   Constipation   Bimalleolar fracture of right ankle    Time spent: 25 minutes.     [place] [facility] Pager [phone] . If 7PM-7AM, please contact night-coverage at [email or web address], password [facility] 05/02/2015, 7:13 PM  LOS: 2 days

## 2015-05-03 ENCOUNTER — Inpatient Hospital Stay (HOSPITAL_COMMUNITY): Payer: Medicare Other

## 2015-05-03 MED ORDER — TRAMADOL HCL 50 MG PO TABS
50.0000 mg | ORAL_TABLET | Freq: Four times a day (QID) | ORAL | Status: DC | PRN
  Administered 2015-05-03 – 2015-05-04 (×2): 50 mg via ORAL
  Filled 2015-05-03 (×2): qty 1

## 2015-05-03 MED ORDER — PROMETHAZINE HCL 25 MG/ML IJ SOLN: 25.0000 mg | Freq: Four times a day (QID) | INTRAMUSCULAR | Status: DC | PRN

## 2015-05-03 MED ORDER — PROMETHAZINE HCL 25 MG/ML IJ SOLN
12.5000 mg | Freq: Four times a day (QID) | INTRAMUSCULAR | Status: DC | PRN
  Administered 2015-05-04: 12.5 mg via INTRAVENOUS
  Filled 2015-05-03 (×2): qty 1

## 2015-05-03 NOTE — Progress Notes (Signed)
Subjective: 1 Day Post-Op Procedure(s) (LRB): OPEN REDUCTION INTERNAL FIXATION (ORIF) ANKLE FRACTURE/BIMALL (Right) Patient reports pain as moderate.  Awake and alert.  PT at the bedside now to work on mobility  Objective: Vital signs in last 24 hours: Temp:  [98.4 F (36.9 C)-99 F (37.2 C)] 98.4 F (36.9 C) (08/20 0429) Pulse Rate:  [75-109] 91 (08/20 0429) Resp:  [14-28] 18 (08/20 0429) BP: (112-143)/(40-77) 143/54 mmHg (08/20 0429) SpO2:  [72 %-99 %] 94 % (08/20 0429) Weight:  [81.103 kg (178 lb 12.8 oz)] 81.103 kg (178 lb 12.8 oz) (08/20 0500)  Intake/Output from previous day: 08/19 0701 - 08/20 0700 In: 600 [I.V.:600] Out: 600 [Urine:400; Emesis/NG output:200] Intake/Output this shift:     Recent Labs  05/01/15 0655  HGB 13.9    Recent Labs  05/01/15 0655  WBC 10.3  RBC 4.45  HCT 41.7  PLT 280    Recent Labs  05/01/15 0655  NA 136  K 4.1  CL 100*  CO2 28  BUN 12  CREATININE 0.89  GLUCOSE 116*  CALCIUM 9.3   No results for input(s): LABPT, INR in the last 72 hours.  Sensation intact distally Compartment soft Splint intact Toes well perfused.   Assessment/Plan: 1 Day Post-Op Procedure(s) (LRB): OPEN REDUCTION INTERNAL FIXATION (ORIF) ANKLE FRACTURE/BIMALL (Right) Up with therapy - NWB right ankle. May need SNF placement pending assistance and ability to mobilize.  Winfield Caba Y 05/03/2015, 9:13 AM

## 2015-05-03 NOTE — Progress Notes (Signed)
Attempted wean off O2 to pt. Her O2 dropped to 85 after activity. Put her back on 2L, her O2 sat stayed above 92. Will monitor her closely.

## 2015-05-03 NOTE — Care Management Note (Signed)
Case Management Note  Patient Details  Name: [name] MRN: [id number] Date of Birth: 1926/11/09  Subjective/Objective:                   OPEN REDUCTION INTERNAL FIXATION (ORIF) ANKLE FRACTURE/BIMALL (Right) Action/Plan:  Discharge planning Expected Discharge Date:                 Expected Discharge Plan:  [place]  In-House Referral:     Discharge planning Services  CM Consult  Post Acute Care Choice:    Choice offered to:     DME Arranged:    DME Agency:     HH Arranged:    [place] Agency:     Status of Service:  Completed, signed off  Medicare Important Message Given:    Date Medicare IM Given:    Medicare IM give by:    Date Additional Medicare IM Given:    Additional Medicare Important Message give by:     If discussed at [place] of Stay Meetings, dates discussed:    Additional Comments: CM notes pt to go to SNF for rehab; CSW arranging. No other CM needs were communicated. Dellie Catholic, RN 05/03/2015, 2:58 PM

## 2015-05-03 NOTE — Progress Notes (Signed)
{  OT ALL NOT Cancellation Note  Patient Details Name: [name] MRN: [id number] DOB: 1926-11-07   Cancelled Treatment:    Reason Eval/Treat Not Completed: Medical issues which prohibited therapy. Pt's nurse requesting OT return later due to pt's O2 SATs dropping to 82% at rest, requiring  O2 being  increased to 5 L  [name] 05/03/2015, 10:40 AM

## 2015-05-03 NOTE — Clinical Social Work Note (Signed)
Patient has a bed at [facility] once medically stable for discharge.  FL-2 on chart for MD signature. CSW remains available as needed.  Glendon Axe, MSW, LCSWA [phone] 05/03/2015 1:30 PM

## 2015-05-03 NOTE — Progress Notes (Signed)
[facility] PROGRESS NOTE  [name] [id number]:[id number] DOB: 05-29-1927 DOA: 04/30/2015 PCP: Loura Pardon, MD  Assessment/Plan: 1. Right ankle fracture: - s/p ORIF on 8/19.  - pain control.  Holding aspirin and on lovenox.  - SNF on discharge.    Chronic diastolic heart failure: Compensated.  Echocardiogram ordered.    Hypertension  Controlled.   Hypoxia: - unclear etiology.  - get CXR.  - on 3 lit of [place] oxygen.     Code Status: full code.  Family Communication: none at bedside Disposition Plan: pending.    Consultants:  Orthopedics.   Procedures:  ORIF ON 8/19  Antibiotics:  none  HPI/Subjective: Pain controlled.   Objective: Filed Vitals:   05/03/15 0429  BP: 143/54  Pulse: 91  Temp: 98.4 F (36.9 C)  Resp: 18    Intake/Output Summary (Last 24 hours) at 05/03/15 1826 Last data filed at 05/03/15 1520  Gross per 24 hour  Intake    120 ml  Output    600 ml  Net   -480 ml   Filed Weights   04/30/15 0211 05/02/15 0500 05/03/15 0500  Weight: 72.576 kg (160 lb) 79.47 kg (175 lb 3.2 oz) 81.103 kg (178 lb 12.8 oz)    Exam:   General:  Alert afebrile comfortable  Cardiovascular: s1s2  Respiratory: ctab  Abdomen: soft NT ND BS+  Musculoskeletal: right ankle splint.   Data Reviewed: Basic Metabolic Panel:  Recent Labs Lab 04/30/15 0330 05/01/15 0655  NA 135 136  K 4.4 4.1  CL 99* 100*  CO2 26 28  GLUCOSE 120* 116*  BUN 19 12  CREATININE 0.97 0.89  CALCIUM 9.6 9.3   Liver Function Tests: No results for input(s): AST, ALT, ALKPHOS, BILITOT, PROT, ALBUMIN in the last 168 hours. No results for input(s): LIPASE, AMYLASE in the last 168 hours. No results for input(s): AMMONIA in the last 168 hours. CBC:  Recent Labs Lab 04/30/15 0330 05/01/15 0655  WBC 15.1* 10.3  NEUTROABS 13.1*  --   HGB 13.5 13.9  HCT 39.5 41.7  MCV 91.6 93.7  PLT 311 280   Cardiac Enzymes: No results for input(s): CKTOTAL, CKMB, CKMBINDEX,  TROPONINI in the last 168 hours. BNP (last 3 results) No results for input(s): BNP in the last 8760 hours.  ProBNP (last 3 results) No results for input(s): PROBNP in the last 8760 hours.  CBG:  Recent Labs Lab 05/02/15 1401  GLUCAP 105*    Recent Results (from the past 240 hour(s))  Urine culture     Status: None   Collection Time: 04/30/15  6:09 AM  Result Value Ref Range Status   Specimen Description URINE, CLEAN CATCH  Final   Special Requests NONE  Final   Culture MULTIPLE SPECIES PRESENT, SUGGEST RECOLLECTION  Final   Report Status 05/02/2015 FINAL  Final  Surgical pcr screen     Status: None   Collection Time: 05/01/15 10:02 AM  Result Value Ref Range Status   MRSA, PCR NEGATIVE NEGATIVE Final   Staphylococcus aureus NEGATIVE NEGATIVE Final    Comment:        The Xpert SA Assay (FDA approved for NASAL specimens in patients over 98 years of age), is one component of a comprehensive surveillance program.  Test performance has been validated by [facility] for patients greater than or equal to 4 year old. It is not intended to diagnose infection nor to guide or monitor treatment.      Studies: Dg Ankle  Complete Right  05/02/2015   CLINICAL DATA:  ORIF of distal tibia and fibular fractures  EXAM: DG C-ARM 61-120 MIN; RIGHT ANKLE - COMPLETE 3+ VIEW  COMPARISON:  None.  FLUOROSCOPY TIME:  Radiation Exposure Index (as provided by the fluoroscopic device): Not available  If the device does not provide the exposure index:  Fluoroscopy Time:  53 seconds  Number of Acquired Images:  4  FINDINGS: Fixation sideplate is noted along the distal fibula with a fixation screw traversing the medial malleolus. Fracture fragments are in anatomic alignment.  IMPRESSION: Status post ORIF of distal tibial and fibular fractures.   Electronically Signed   By: Inez Catalina M.D.   On: 05/02/2015 13:42   Dg C-arm 1-60 Min  05/02/2015   CLINICAL DATA:  ORIF of distal tibia and fibular  fractures  EXAM: DG C-ARM 61-120 MIN; RIGHT ANKLE - COMPLETE 3+ VIEW  COMPARISON:  None.  FLUOROSCOPY TIME:  Radiation Exposure Index (as provided by the fluoroscopic device): Not available  If the device does not provide the exposure index:  Fluoroscopy Time:  53 seconds  Number of Acquired Images:  4  FINDINGS: Fixation sideplate is noted along the distal fibula with a fixation screw traversing the medial malleolus. Fracture fragments are in anatomic alignment.  IMPRESSION: Status post ORIF of distal tibial and fibular fractures.   Electronically Signed   By: Inez Catalina M.D.   On: 05/02/2015 13:42    Scheduled Meds: . acetaminophen  650 mg Oral QID  . aspirin EC  325 mg Oral BID  . cholecalciferol  800 Units Oral Daily  . diltiazem  240 mg Oral Daily  . docusate sodium  100 mg Oral BID  . multivitamin with minerals  1 tablet Oral Daily  . pantoprazole  40 mg Oral Daily  . rOPINIRole  1 mg Oral QHS  . sodium chloride  3 mL Intravenous Q12H  . spironolactone  25 mg Oral Daily   Continuous Infusions: . sodium chloride 125 mL/hr at 05/02/15 2216  . lactated ringers 50 mL/hr at 05/02/15 1103    Principal Problem:   Closed right ankle fracture Active Problems:   Pure hypercholesterolemia   Essential hypertension   GERD   Osteoarthritis   BREAST CANCER, HX OF   Constipation   Bimalleolar fracture of right ankle    Time spent: 25 minutes.     [facility] Pager [phone] . If 7PM-7AM, please contact night-coverage at [email or web address], password [facility] 05/03/2015, 6:26 PM  LOS: 3 days

## 2015-05-03 NOTE — Clinical Social Work Placement (Signed)
   CLINICAL SOCIAL WORK PLACEMENT  NOTE  Date:  05/03/2015  Patient Details  Name: [name] MRN: [id number] Date of Birth: 1927/07/05  Clinical Social Work is seeking post-discharge placement for this patient at the [place] level of care (*CSW will initial, date and re-position this form in  chart as items are completed):  Yes   Patient/family provided with [facility] Work Department's list of facilities offering this level of care within the geographic area requested by the patient (or if unable, by the patient's family).  Yes   Patient/family informed of their freedom to choose among providers that offer the needed level of care, that participate in Medicare, Medicaid or managed care program needed by the patient, have an available bed and are willing to accept the patient.  Yes   Patient/family informed of [facility]'s ownership interest in [facility] and [facility], as well as of the fact that they are under no obligation to receive care at these facilities.  PASRR submitted to EDS on 05/03/15     PASRR number received on 05/03/15     Existing PASRR number confirmed on       FL2 transmitted to all facilities in geographic area requested by pt/family on 05/03/15     FL2 transmitted to all facilities within larger geographic area on       Patient informed that his/her managed care company has contracts with or will negotiate with certain facilities, including the following:            Patient/family informed of bed offers received.  Patient chooses bed at       Physician recommends and patient chooses bed at      Patient to be transferred to   on  .  Patient to be transferred to facility by       Patient family notified on   of transfer.  Name of family member notified:        PHYSICIAN Please sign FL2     Additional Comment:    _______________________________________________ Gl[place]on Axe, MSW, LCSWA [phone] 05/03/2015 1:21 PM

## 2015-05-03 NOTE — Clinical Social Work Note (Signed)
Clinical Social Work Assessment  Patient Details  Name: [name] MRN: [id number] Date of Birth: July 04, 1927  Date of referral:  05/03/15               Reason for consult:  Facility Placement, Discharge Planning                Permission sought to share information with:  Case Manager, Facility Sport and exercise psychologist, Family Supports Permission granted to share information::  Yes, Verbal Permission Granted  Name::      Annamary Carolin and/or Jonita Albee )  Agency::   (SNF's )  Relationship::   (Sisters )  Contact Information:   [phone] or [phone])  Housing/Transportation Living arrangements for the past 2 months:  [place] of Information:  Patient Patient Interpreter Needed:  None Criminal Activity/Legal Involvement Pertinent to Current Situation/Hospitalization:  No - Comment as needed Significant Relationships:  Siblings Lives with:  Self Do you feel safe going back to the place where you live?  Yes Need for family participation in patient care:  Yes (Comment)  Care giving concerns:  Patient requiring short-term rehab at [facility].    Social Worker assessment / plan:  Holiday representative met with patient at bedside in reference to post-acute placement for SNF. CSW introduced CSW role and SNF process. CSW also reviewed and provided SNF list. Patient reported she is not familiar with SNF's however has been to visit a friend at [facility] and [facility] and prefers facility because it is closet to her home. Pt agreeable to SNF search at other facilities as well to expand options. Pt stated she lives alone at home however her sister lives next door and available to assist her as needed. Pt reported although she would like to return home she realizes SNF placement is in her best interested for recovery. No further concerns reported by pt at this time. CSW as notified [facility] to review referral. CSW will continue to follow pt and pt's family for continued  support and to facilitate pt's discharge needs once medically stable.   Employment status:  Retired Health visitor, Managed Care PT Recommendations:  [place] / Referral to community resources:  [place]  Patient/Family's Response to care:  Pt alert and oriented x4. Pt agreeable to SNF placement and prefers [facility]. Pt pleasant and appreciated social work intervention.   Patient/Family's Understanding of and Emotional Response to Diagnosis, Current Treatment, and Prognosis:  Pt understanding of surgical procedure and continued treatment. CSW remains available as needed.   Emotional Assessment Appearance:  Appears stated age Attitude/Demeanor/Rapport:   (Pleasant ) Affect (typically observed):  Accepting, Appropriate, Pleasant, Hopeful Orientation:  Oriented to Self, Oriented to Place, Oriented to  Time, Oriented to Situation Alcohol / Substance use:  Not Applicable Psych involvement (Current and /or in the community):  No (Comment)  Discharge Needs  Concerns to be addressed:  Basic Needs, Care Coordination Readmission within the last 30 days:  No Current discharge risk:  Dependent with Mobility, Lives alone Barriers to Discharge:  Continued Medical Work up   [employer], MSW, LCSWA [phone] 05/03/2015 1:21 PM

## 2015-05-03 NOTE — Evaluation (Signed)
Physical Therapy Evaluation Patient Details Name: [name] MRN: [id number] DOB: [birth year] Today's Date: 05/03/2015   History of Present Illness  Pt is a 81 y/o F s/p fall and now ORIF Rt ankle.  Pt's PMH includes incontinence, chornic cough, compression fx, hemorrhoids, diverticulosis, osteoporosis, hematuria, DM, back surgery, shoulder surgery, carpal tunnel release.  Clinical Impression  Patient is s/p above surgery resulting in functional limitations due to the deficits listed below (see PT Problem List). [name] lives alone and does not have any assist available at home upon d/c.  She has a full flight to get upstairs to her bedroom/bathroom.  Patient will benefit from skilled PT to increase their independence and safety with mobility to allow discharge to the venue listed below.     Follow Up Recommendations SNF;Supervision/Assistance - 24 hour    Equipment Recommendations  3in1 (PT)    Recommendations for Other Services       Precautions / Restrictions Precautions Precautions: Fall Precaution Comments: admitted s/p fall w/ Rt ankle fx Restrictions Weight Bearing Restrictions: Yes RLE Weight Bearing: Non weight bearing      Mobility  Bed Mobility Overal bed mobility: Needs Assistance Bed Mobility: Supine to Sit     Supine to sit: Min assist     General bed mobility comments: Min assist managing Rt LE to EOB.  Use of bed rails and incrased time.  Cues for technique.  Transfers Overall transfer level: Needs assistance Equipment used: Rolling walker (2 wheeled) Transfers: Sit to/from [facility] Sit to Stand: Min assist Stand pivot transfers: Min guard       General transfer comment: Min assist to power up to standing.  Demonstration and VCs for proper sit>stand technique.  Min guard assist for stand pivot w/ directional cues.  Pt w/ productive vomit upon standing from bed but able to maintain balance.  Pt w/ strong Bil UE support that allows  her to adhere to NWB status of Rt LE.  Ambulation/Gait                Stairs            Wheelchair Mobility    Modified Rankin (Stroke Patients Only)       Balance Overall balance assessment: Needs assistance;History of Falls Sitting-balance support: Bilateral upper extremity supported;Feet supported Sitting balance-Leahy Scale: Good     Standing balance support: Bilateral upper extremity supported;During functional activity Standing balance-Leahy Scale: Poor Standing balance comment: Relies on RW for support                             Pertinent Vitals/Pain Pain Assessment: 0-10 Pain Score: 6  Pain Location: Rt foot Pain Descriptors / Indicators: Aching;Throbbing Pain Intervention(s): Limited activity within patient's tolerance;Monitored during session;Repositioned    Home Living Family/patient expects to be discharged to:: Skilled nursing facility Living Arrangements: Alone Available Help at Discharge: Family Type of Home: House Home Access: Stairs to enter Entrance Stairs-Rails: Left Entrance Stairs-Number of Steps: 3 Home Layout: Two level;Bed/bath upstairs (sofa downstairs w/ pull out bed; bathroom w/ tub/shower down) Home Equipment: Walker - 2 wheels;Walker - standard;Tub bench Additional Comments: sister lives next door but "she needs more help than I do" for mobility    Prior Function Level of Independence: Independent               Hand Dominance        Extremity/Trunk Assessment   Upper Extremity  Assessment: Overall WFL for tasks assessed           Lower Extremity Assessment: RLE deficits/detail RLE Deficits / Details: weakness and limited ROM s/p Rt ankle ORIF       Communication   Communication: HOH  Cognition Arousal/Alertness: Awake/alert Behavior During Therapy: WFL for tasks assessed/performed Overall Cognitive Status: Within Functional Limits for tasks assessed                      General  Comments General comments (skin integrity, edema, etc.): Pt's SpO2 dropped to 76% on RA during bed mobility and O2 was reapplied via [place] immediately at 3 LPM w/ SpO2 remaining above 94% for the remainder of the session.  Educated pt on proper breathing technique.    Exercises General Exercises - Lower Extremity Ankle Circles/Pumps: AROM;Left;15 reps;Supine Quad Sets: Strengthening;Both;10 reps;Supine Gluteal Sets: Strengthening;Both;10 reps;Supine Straight Leg Raises: Left;Supine;Strengthening;5 reps      Assessment/Plan    PT Assessment Patient needs continued PT services  PT Diagnosis Difficulty walking;Abnormality of gait;Generalized weakness;Acute pain   PT Problem List Decreased strength;Decreased range of motion;Decreased activity tolerance;Decreased balance;Decreased mobility;Decreased coordination;Decreased knowledge of use of DME;Decreased safety awareness;Decreased knowledge of precautions;Impaired sensation;Decreased skin integrity;Pain  PT Treatment Interventions DME instruction;Gait training;Stair training;Functional mobility training;Therapeutic activities;Therapeutic exercise;Balance training;Neuromuscular re-education;Patient/family education;Modalities   PT Goals (Current goals can be found in the Care Plan section) Acute Rehab PT Goals Patient Stated Goal: to get home so she can get back to her routine PT Goal Formulation: With patient Time For Goal Achievement: 05/14/15 Potential to Achieve Goals: Good    Frequency Min 3X/week   Barriers to discharge Inaccessible home environment;Decreased caregiver support No assist at home and has stairs to enter home and full flight to get to bedroom/bathroom upstairs    Co-evaluation               End of Session Equipment Utilized During Treatment: Gait belt;Oxygen Activity Tolerance: Treatment limited secondary to medical complications (Comment);Patient limited by fatigue (productive vomiting) Patient left: in  chair;with call bell/phone within reach Nurse Communication: Mobility status;Weight bearing status;Precautions         Time: 4196-2229 PT Time Calculation (min) (ACUTE ONLY): 36 min   Charges:   PT Evaluation $Initial PT Evaluation Tier I: 1 Procedure PT Treatments $Therapeutic Activity: 8-22 mins   PT G Codes:       Joslyn Hy PT, DPT [phone] Pager: [phone] 05/03/2015, 10:26 AM

## 2015-05-04 ENCOUNTER — Encounter (HOSPITAL_COMMUNITY): Payer: Self-pay | Admitting: Orthopaedic Surgery

## 2015-05-04 ENCOUNTER — Inpatient Hospital Stay (HOSPITAL_COMMUNITY): Payer: Medicare Other

## 2015-05-04 DIAGNOSIS — K5900 Constipation, unspecified: Secondary | ICD-10-CM

## 2015-05-04 LAB — BASIC METABOLIC PANEL
ANION GAP: 13 (ref 5–15)
BUN: 17 mg/dL (ref 6–20)
CALCIUM: 9.1 mg/dL (ref 8.9–10.3)
CO2: 28 mmol/L (ref 22–32)
Chloride: 93 mmol/L — ABNORMAL LOW (ref 101–111)
Creatinine, Ser: 0.94 mg/dL (ref 0.44–1.00)
GFR calc Af Amer: >60 mL/min (ref >60)
GFR, EST NON AFRICAN AMERICAN: 53 mL/min — AB (ref >60)
Glucose, Bld: 127 mg/dL — ABNORMAL HIGH (ref 65–99)
POTASSIUM: 4.4 mmol/L (ref 3.5–5.1)
SODIUM: 134 mmol/L — AB (ref 135–145)

## 2015-05-04 LAB — CBC
HCT: 40.1 % (ref 36.0–46.0)
Hemoglobin: 13.2 g/dL (ref 12.0–15.0)
MCH: 31.1 pg (ref 26.0–34.0)
MCHC: 32.9 g/dL (ref 30.0–36.0)
MCV: 94.6 fL (ref 78.0–100.0)
PLATELETS: 265 10*3/uL (ref 150–400)
RBC: 4.24 MIL/uL (ref 3.87–5.11)
RDW: 13.7 % (ref 11.5–15.5)
WBC: 16.2 10*3/uL — AB (ref 4.0–10.5)

## 2015-05-04 LAB — PROCALCITONIN: PROCALCITONIN: 0.25 ng/mL

## 2015-05-04 MED ORDER — POLYETHYLENE GLYCOL 3350 17 G PO PACK
17.0000 g | PACK | Freq: Two times a day (BID) | ORAL | Status: DC
  Administered 2015-05-05 – 2015-05-06 (×3): 17 g via ORAL
  Filled 2015-05-04 (×4): qty 1

## 2015-05-04 MED ORDER — PANTOPRAZOLE SODIUM 40 MG PO TBEC
40.0000 mg | DELAYED_RELEASE_TABLET | Freq: Two times a day (BID) | ORAL | Status: DC
  Administered 2015-05-05 – 2015-05-06 (×4): 40 mg via ORAL
  Filled 2015-05-04 (×4): qty 1

## 2015-05-04 MED ORDER — IOHEXOL 300 MG/ML  SOLN
25.0000 mL | INTRAMUSCULAR | Status: AC
  Administered 2015-05-04: 25 mL via ORAL

## 2015-05-04 MED ORDER — MORPHINE SULFATE (PF) 2 MG/ML IV SOLN: 1.0000 mg | INTRAVENOUS | Status: DC | PRN

## 2015-05-04 MED ORDER — ASPIRIN 325 MG PO TABS
ORAL_TABLET | ORAL | Status: AC
  Filled 2015-05-04: qty 1

## 2015-05-04 MED ORDER — SENNOSIDES-DOCUSATE SODIUM 8.6-50 MG PO TABS
2.0000 | ORAL_TABLET | Freq: Two times a day (BID) | ORAL | Status: DC
Start: 2015-05-04 — End: 2015-05-06
  Administered 2015-05-05 – 2015-05-06 (×4): 2 via ORAL
  Filled 2015-05-04 (×5): qty 2

## 2015-05-04 MED ORDER — FLEET ENEMA 7-19 GM/118ML RE ENEM
1.0000 | ENEMA | Freq: Every day | RECTAL | Status: DC | PRN
  Administered 2015-05-04: 1 via RECTAL
  Filled 2015-05-04: qty 1

## 2015-05-04 MED ORDER — BISACODYL 5 MG PO TBEC: 5.0000 mg | DELAYED_RELEASE_TABLET | Freq: Every day | ORAL | Status: DC | PRN

## 2015-05-04 NOTE — Progress Notes (Signed)
Patient continues to have episodes of nausea and vomiting, unresolved by antiemetic medication.  Dr. Karleen Hampshire made aware, new orders received.  Will continue to monitor patient.

## 2015-05-04 NOTE — Progress Notes (Signed)
[facility] PROGRESS NOTE  [name] ENI:[id number] DOB: 11-03-1926 DOA: 04/30/2015 PCP: Loura Pardon, MD Interim summary: [name] is a 81 y.o. female, with hypertension, GERD, osteoporosis and a history of breast cancer and endometrial cancer. She presents the emergency department with a right ankle fracture. She underwent ORIF on 8/19. On 8/20 she reported constipation and an abdominal film shows dilated loops concerning for SBO. A repeat CT abdomen and pelvis ordered .  Assessment/Plan: 1. Right ankle fracture: - s/p ORIF on 8/19.  - pain control.   - SNF on discharge.    Chronic diastolic heart failure: Compensated.  Echocardiogram ordered.    Hypertension  Controlled.   Hypoxia: - from hypoventilation.  - get CXR shows basilar atelectasis.  - on 3 lit of [place] oxygen.   Constipation: - abd film shows dilated loops of small bowel, a CT abdomen and pelvis ordered. - laxatives and stool softeners ordered.    Code Status: full code.  Family Communication: none at bedside Disposition Plan: pending, SNF when stable. .    Consultants:  Orthopedics.   Procedures:  ORIF ON 8/19  Antibiotics:  none  HPI/Subjective:  nauseated. Vomited.  Objective: Filed Vitals:   05/04/15 0537  BP: 144/45  Pulse: 97  Temp: 98.8 F (37.1 C)  Resp: 16    Intake/Output Summary (Last 24 hours) at 05/04/15 1658 Last data filed at 05/04/15 6144  Gross per 24 hour  Intake      0 ml  Output      0 ml  Net      0 ml   Filed Weights   04/30/15 0211 05/02/15 0500 05/03/15 0500  Weight: 72.576 kg (160 lb) 79.47 kg (175 lb 3.2 oz) 81.103 kg (178 lb 12.8 oz)    Exam:   General:  Alert afebrile comfortable  Cardiovascular: s1s2  Respiratory: ctab  Abdomen: soft distended , bowel sounds heard. Tender. BS+  Musculoskeletal: right ankle bandaged.    Data Reviewed: Basic Metabolic Panel:  Recent Labs Lab 04/30/15 0330 05/01/15 0655 05/04/15 0643  NA 135  136 134*  K 4.4 4.1 4.4  CL 99* 100* 93*  CO2 26 28 28   GLUCOSE 120* 116* 127*  BUN 19 12 17   CREATININE 0.97 0.89 0.94  CALCIUM 9.6 9.3 9.1   Liver Function Tests: No results for input(s): AST, ALT, ALKPHOS, BILITOT, PROT, ALBUMIN in the last 168 hours. No results for input(s): LIPASE, AMYLASE in the last 168 hours. No results for input(s): AMMONIA in the last 168 hours. CBC:  Recent Labs Lab 04/30/15 0330 05/01/15 0655 05/04/15 0643  WBC 15.1* 10.3 16.2*  NEUTROABS 13.1*  --   --   HGB 13.5 13.9 13.2  HCT 39.5 41.7 40.1  MCV 91.6 93.7 94.6  PLT 311 280 265   Cardiac Enzymes: No results for input(s): CKTOTAL, CKMB, CKMBINDEX, TROPONINI in the last 168 hours. BNP (last 3 results) No results for input(s): BNP in the last 8760 hours.  ProBNP (last 3 results) No results for input(s): PROBNP in the last 8760 hours.  CBG:  Recent Labs Lab 05/02/15 1401  GLUCAP 105*    Recent Results (from the past 240 hour(s))  Urine culture     Status: None   Collection Time: 04/30/15  6:09 AM  Result Value Ref Range Status   Specimen Description URINE, CLEAN CATCH  Final   Special Requests NONE  Final   Culture MULTIPLE SPECIES PRESENT, SUGGEST RECOLLECTION  Final   Report  Status 05/02/2015 FINAL  Final  Surgical pcr screen     Status: None   Collection Time: 05/01/15 10:02 AM  Result Value Ref Range Status   MRSA, PCR NEGATIVE NEGATIVE Final   Staphylococcus aureus NEGATIVE NEGATIVE Final    Comment:        The Xpert SA Assay (FDA approved for NASAL specimens in patients over 25 years of age), is one component of a comprehensive surveillance program.  Test performance has been validated by [facility] for patients greater than or equal to 54 year old. It is not intended to diagnose infection nor to guide or monitor treatment.      Studies: Dg Chest 2 View  05/03/2015   CLINICAL DATA:  Acute onset of hypoxia.  Initial encounter.  EXAM: CHEST  2 VIEW  COMPARISON:   Chest radiograph performed 04/30/2015  FINDINGS: The lungs are hypoexpanded, with persistent elevation of the right hemidiaphragm. Mild bibasilar opacities likely reflect atelectasis. No pleural effusion or pneumothorax is seen. The lateral view is suboptimal due to lung hypoexpansion.  The heart is mildly enlarged. Diffuse calcification is noted along the abdominal aorta. No acute osseous abnormalities are seen. Clips are noted at the left axilla.  IMPRESSION: Lungs hypoexpanded, with persistent elevation of the right hemidiaphragm. Mild bibasilar opacities likely reflect atelectasis. Mild cardiomegaly noted.   Electronically Signed   By: Garald Balding M.D.   On: 05/03/2015 21:48   Dg Abd 2 Views  05/04/2015   CLINICAL DATA:  Nausea and vomiting.  Constipation.  EXAM: ABDOMEN - 2 VIEW  COMPARISON:  None.  FINDINGS: Air-fluid levels are present in small bowel on the decubitus view. Gaseous distention of the stomach on the supine frontal radiograph. Small bowel dilation cannot be measured because it is only seen on the decubitus view. There is a moderate stool burden present. Stool is present in the rectosigmoid. Cholecystectomy clips are present in the right upper quadrant.  IMPRESSION: Likely dilated loops of small bowel with air-fluid levels on the decubitus view. This can be associated with enteritis or small bowel obstruction. Consider CT with oral and IV contrast for further evaluation.   Electronically Signed   By: Dereck Ligas M.D.   On: 05/04/2015 15:32    Scheduled Meds: . acetaminophen  650 mg Oral QID  . aspirin EC  325 mg Oral BID  . cholecalciferol  800 Units Oral Daily  . diltiazem  240 mg Oral Daily  . multivitamin with minerals  1 tablet Oral Daily  . pantoprazole  40 mg Oral BID  . polyethylene glycol  17 g Oral BID  . rOPINIRole  1 mg Oral QHS  . senna-docusate  2 tablet Oral BID  . sodium chloride  3 mL Intravenous Q12H  . spironolactone  25 mg Oral Daily   Continuous  Infusions: . sodium chloride 125 mL/hr at 05/02/15 2216  . lactated ringers 50 mL/hr at 05/02/15 1103    Principal Problem:   Closed right ankle fracture Active Problems:   Pure hypercholesterolemia   Essential hypertension   GERD   Osteoarthritis   BREAST CANCER, HX OF   Constipation   Bimalleolar fracture of right ankle    Time spent: 25 minutes.     [facility] Pager [phone] . If 7PM-7AM, please contact night-coverage at [email or web address], password [facility] 05/04/2015, 4:58 PM  LOS: 4 days

## 2015-05-04 NOTE — Progress Notes (Signed)
Subjective: 2 Days Post-Op Procedure(s) (LRB): OPEN REDUCTION INTERNAL FIXATION (ORIF) ANKLE FRACTURE/BIMALL (Right) Patient reports pain as moderate.  Feels nauseated this AM.   Objective: Vital signs in last 24 hours: Temp:  [98.8 F (37.1 C)-99 F (37.2 C)] 98.8 F (37.1 C) (08/21 0537) Pulse Rate:  [86-97] 97 (08/21 0537) Resp:  [16-17] 16 (08/21 0537) BP: (130-144)/(45-53) 144/45 mmHg (08/21 0537) SpO2:  [92 %-94 %] 94 % (08/21 0636)  Intake/Output from previous day: 08/20 0701 - 08/21 0700 In: 120 [P.O.:120] Out: 200 [Urine:200] Intake/Output this shift:     Recent Labs  05/04/15 0643  HGB 13.2    Recent Labs  05/04/15 0643  WBC 16.2*  RBC 4.24  HCT 40.1  PLT 265    Recent Labs  05/04/15 0643  NA 134*  K 4.4  CL 93*  CO2 28  BUN 17  CREATININE 0.94  GLUCOSE 127*  CALCIUM 9.1   No results for input(s): LABPT, INR in the last 72 hours.  Right lower extremity: Sensation intact distally Incision: dressing C/D/I   CXR: Atelectasis   Assessment/Plan: 2 Days Post-Op Procedure(s) (LRB): OPEN REDUCTION INTERNAL FIXATION (ORIF) ANKLE FRACTURE/BIMALL (Right) Up with therapy  Encouraged incentive spirometry    [name] 05/04/2015, 8:59 AM

## 2015-05-04 NOTE — Progress Notes (Signed)
Occupational Therapy Evaluation Patient Details Name: [name] MRN: [id number] DOB: 21-Feb-1927 Today's Date: 05/04/2015    History of Present Illness Pt is a 81 y/o F s/p fall and now ORIF Rt ankle.  Pt's PMH includes incontinence, chornic cough, compression fx, hemorrhoids, diverticulosis, osteoporosis, hematuria, DM, back surgery, shoulder surgery, carpal tunnel release.   Clinical Impression   Pt admitted with the above diagnoses and presents with below problem list. Pt will benefit from continued acute OT to address the below listed deficits and maximize independence with BADLs. PTA pt was independent with ADLs. Pt is at mod A level for LB ADLs. Pt limited by n/v this session. OT to continue to follow acutely.     Follow Up Recommendations  SNF    Equipment Recommendations  Other (comment) (TBD next venue)    Recommendations for Other Services       Precautions / Restrictions Precautions Precautions: Fall Precaution Comments: admitted s/p fall w/ Rt ankle fx Restrictions Weight Bearing Restrictions: Yes RLE Weight Bearing: Non weight bearing      Mobility Bed Mobility Overal bed mobility: Needs Assistance Bed Mobility: Supine to Sit     Supine to sit: Min assist     General bed mobility comments: Light min A to advance RLE. Use of rails.   Transfers Overall transfer level: Needs assistance Equipment used: Rolling walker (2 wheeled) Transfers: Sit to/from [facility] Sit to Stand: Mod assist;Min assist Stand pivot transfers: Min guard       General transfer comment: Mostly min A, mod A 1x from 3n1 due to decreased balance. From EOB, 3n1 and recliner. Cues for hand placement and technique with rw.    Balance Overall balance assessment: Needs assistance Sitting-balance support: No upper extremity supported;Feet supported Sitting balance-Leahy Scale: Good     Standing balance support: Bilateral upper extremity supported;During  functional activity Standing balance-Leahy Scale: Poor Standing balance comment: rw for support                            ADL Overall ADL's : Needs assistance/impaired Eating/Feeding: Set up;Sitting   Grooming: Set up;Sitting   Upper Body Bathing: Set up;Sitting   Lower Body Bathing: Moderate assistance;With adaptive equipment;Sit to/from stand   Upper Body Dressing : Set up;Sitting   Lower Body Dressing: Moderate assistance;Sit to/from stand;With adaptive equipment   Toilet Transfer: Mod A;Stand-pivot;BSC;RW   Toileting- Clothing Manipulation and Hygiene: Moderate assistance;Sit to/from stand   Tub/ Shower Transfer: Stand-pivot;3 in [employer]   Functional mobility during ADLs: Min guard (pivotol steps) General ADL Comments: Pt completed SPT from EOB to 3n1 as detailed above. Educated on NWB precaution with min cueing provided to keep RLE off ground. Pt able to use BUE strength to pivot with min guard asssit. Mostly min A for sit<>stand transfers with 1 occurrence of mod A needed standing from 3n1. and mod A for LB ADLs due to decreased dynamic balance. Pt limited by nausea with emesis this session.     Vision     Perception     Praxis      Pertinent Vitals/Pain Pain Assessment: No/denies pain     Hand Dominance Right   Extremity/Trunk Assessment Upper Extremity Assessment Upper Extremity Assessment: Overall WFL for tasks assessed   Lower Extremity Assessment Lower Extremity Assessment: Defer to PT evaluation       Communication Communication Communication: HOH   Cognition Arousal/Alertness: Awake/alert Behavior During Therapy: [facility] for tasks  assessed/performed Overall Cognitive Status: Within Functional Limits for tasks assessed       Memory: Decreased recall of precautions             General Comments       Exercises       Shoulder Instructions      Home Living Family/patient expects to be discharged to:: Skilled nursing  facility Living Arrangements: Alone Available Help at Discharge: Family Type of Home: House Home Access: Stairs to enter [employer] of Steps: 3 Entrance Stairs-Rails: Left Home Layout: Two level;Bed/bath upstairs Alternate Level Stairs-Number of Steps: 12 Alternate Level Stairs-Rails: Left           Home Equipment: Walker - 2 wheels;Walker - standard;Tub bench   Additional Comments: sister lives next door but "she needs more help than I do" for mobility      Prior Functioning/Environment Level of Independence: Independent             OT Diagnosis: Acute pain   OT Problem List: Decreased activity tolerance;Impaired balance (sitting and/or standing);Decreased knowledge of use of DME or AE;Decreased knowledge of precautions;Pain   OT Treatment/Interventions: Self-care/ADL training;Therapeutic exercise;DME and/or AE instruction;Therapeutic activities;Patient/family education;Balance training;Energy conservation    OT Goals(Current goals can be found in the care plan section) Acute Rehab OT Goals Patient Stated Goal: to get home so she can get back to her routine OT Goal Formulation: With patient Time For Goal Achievement: 05/11/15 Potential to Achieve Goals: Good ADL Goals Pt Will Perform Lower Body Bathing: with adaptive equipment;sit to/from stand;with supervision Pt Will Perform Lower Body Dressing: with modified independence;with adaptive equipment;sit to/from stand Pt Will Transfer to Toilet: with modified independence;ambulating (3n1 over toilet) Pt Will Perform Toileting - Clothing Manipulation and hygiene: with modified independence;sit to/from stand;sitting/lateral leans;with adaptive equipment Pt Will Perform Tub/Shower Transfer: Tub transfer;ambulating;tub bench;rolling walker Additional ADL Goal #1: Pt will complete bed mobility at mod I level to prepare for OOB ADLs.  OT Frequency: Min 2X/week   Barriers to D/C: Decreased caregiver  support;Inaccessible home environment          Co-evaluation              End of Session Equipment Utilized During Treatment: Gait belt;Rolling walker;Oxygen Nurse Communication: Other (comment) (O2 stats, cough, n/v)  Activity Tolerance: Other (comment) (nausea with emesis) Patient left: in chair;with call bell/phone within reach;with chair alarm set   Time: 1111-1150 OT Time Calculation (min): 39 min Charges:  OT General Charges $OT Visit: 1 Procedure OT Evaluation $Initial OT Evaluation Tier I: 1 Procedure OT Treatments $Self Care/Home Management : 23-37 mins G-Codes:    [name] 05/20/2015, 12:16 PM

## 2015-05-05 ENCOUNTER — Encounter (HOSPITAL_COMMUNITY): Payer: Self-pay

## 2015-05-05 ENCOUNTER — Inpatient Hospital Stay (HOSPITAL_COMMUNITY): Payer: Medicare Other

## 2015-05-05 LAB — BASIC METABOLIC PANEL
Anion gap: 11 (ref 5–15)
BUN: 31 mg/dL — AB (ref 6–20)
CALCIUM: 8 mg/dL — AB (ref 8.9–10.3)
CHLORIDE: 93 mmol/L — AB (ref 101–111)
CO2: 28 mmol/L (ref 22–32)
CREATININE: 0.91 mg/dL (ref 0.44–1.00)
GFR, EST NON AFRICAN AMERICAN: 56 mL/min — AB (ref >60)
Glucose, Bld: 116 mg/dL — ABNORMAL HIGH (ref 65–99)
Potassium: 3.7 mmol/L (ref 3.5–5.1)
SODIUM: 132 mmol/L — AB (ref 135–145)

## 2015-05-05 LAB — CBC
HCT: 35 % — ABNORMAL LOW (ref 36.0–46.0)
HEMOGLOBIN: 11.8 g/dL — AB (ref 12.0–15.0)
MCH: 30.8 pg (ref 26.0–34.0)
MCHC: 33.7 g/dL (ref 30.0–36.0)
MCV: 91.4 fL (ref 78.0–100.0)
PLATELETS: 275 10*3/uL (ref 150–400)
RBC: 3.83 MIL/uL — ABNORMAL LOW (ref 3.87–5.11)
RDW: 13.9 % (ref 11.5–15.5)
WBC: 18.9 10*3/uL — ABNORMAL HIGH (ref 4.0–10.5)

## 2015-05-05 MED ORDER — IOHEXOL 300 MG/ML  SOLN
100.0000 mL | Freq: Once | INTRAMUSCULAR | Status: AC | PRN
  Administered 2015-05-05: 100 mL via INTRAVENOUS

## 2015-05-05 NOTE — Progress Notes (Signed)
[facility] PROGRESS NOTE  [name] ZJI:[id number] DOB: [birth year] DOA: 04/30/2015 PCP: Loura Pardon, MD Interim summary: [name] is a 81 y.o. female, with hypertension, GERD, osteoporosis and a history of breast cancer and endometrial cancer. She presents the emergency department with a right ankle fracture. She underwent ORIF on 8/19. On 8/20 she reported constipation and an abdominal film shows dilated loops concerning for SBO. A repeat CT abdomen and pelvis ordered, does not show any obstruction, probably ileus.   Assessment/Plan: 1. Right ankle fracture: - s/p ORIF on 8/19.  - pain control.   - SNF on discharge.  - NWB for 6 weeks, followed by suture removal in 2 weeks at orthopedics office.  - on aspirin 325 mg twice daily.     Chronic diastolic heart failure: Compensated.  Echocardiogram ordered.    Hypertension  Controlled.   Hypoxia: - from hypoventilation.  - get CXR shows basilar atelectasis.  - on 3 lit of [place] oxygen.  -plan to wean her off the oxygen.   Constipation: - abd film shows dilated loops of small bowel, a CT abdomen and pelvis ordered. It showed ileus.  - laxatives and stool softeners ordered.  She reports having a good BM.   Leukocytosis: - unclear etiology.  - probably reactive.   Mild hyponatremia.   Code Status: full code.  Family Communication: none at bedside Disposition Plan: pending, SNF when stable. .    Consultants:  Orthopedics.   Procedures:  ORIF ON 8/19  Antibiotics:  none  HPI/Subjective:  nauseated. Vomited.  Objective: Filed Vitals:   05/05/15 1300  BP: 137/74  Pulse: 98  Temp: 97.1 F (36.2 C)  Resp: 16    Intake/Output Summary (Last 24 hours) at 05/05/15 1739 Last data filed at 05/05/15 1300  Gross per 24 hour  Intake 3452.5 ml  Output      3 ml  Net 3449.5 ml   Filed Weights   05/02/15 0500 05/03/15 0500 05/05/15 0500  Weight: 79.47 kg (175 lb 3.2 oz) 81.103 kg (178 lb 12.8 oz)  77.111 kg (170 lb)    Exam:   General:  Alert afebrile comfortable  Cardiovascular: s1s2  Respiratory: ctab  Abdomen: soft distended , bowel sounds heard. Tender. BS+  Musculoskeletal: right ankle bandaged.    Data Reviewed: Basic Metabolic Panel:  Recent Labs Lab 04/30/15 0330 05/01/15 0655 05/04/15 0643 05/05/15 0427  NA 135 136 134* 132*  K 4.4 4.1 4.4 3.7  CL 99* 100* 93* 93*  CO2 26 28 28 28   GLUCOSE 120* 116* 127* 116*  BUN 19 12 17  31*  CREATININE 0.97 0.89 0.94 0.91  CALCIUM 9.6 9.3 9.1 8.0*   Liver Function Tests: No results for input(s): AST, ALT, ALKPHOS, BILITOT, PROT, ALBUMIN in the last 168 hours. No results for input(s): LIPASE, AMYLASE in the last 168 hours. No results for input(s): AMMONIA in the last 168 hours. CBC:  Recent Labs Lab 04/30/15 0330 05/01/15 0655 05/04/15 0643 05/05/15 0427  WBC 15.1* 10.3 16.2* 18.9*  NEUTROABS 13.1*  --   --   --   HGB 13.5 13.9 13.2 11.8*  HCT 39.5 41.7 40.1 35.0*  MCV 91.6 93.7 94.6 91.4  PLT 311 280 265 275   Cardiac Enzymes: No results for input(s): CKTOTAL, CKMB, CKMBINDEX, TROPONINI in the last 168 hours. BNP (last 3 results) No results for input(s): BNP in the last 8760 hours.  ProBNP (last 3 results) No results for input(s): PROBNP in the last 8760  hours.  CBG:  Recent Labs Lab 05/02/15 1401  GLUCAP 105*    Recent Results (from the past 240 hour(s))  Urine culture     Status: None   Collection Time: 04/30/15  6:09 AM  Result Value Ref Range Status   Specimen Description URINE, CLEAN CATCH  Final   Special Requests NONE  Final   Culture MULTIPLE SPECIES PRESENT, SUGGEST RECOLLECTION  Final   Report Status 05/02/2015 FINAL  Final  Surgical pcr screen     Status: None   Collection Time: 05/01/15 10:02 AM  Result Value Ref Range Status   MRSA, PCR NEGATIVE NEGATIVE Final   Staphylococcus aureus NEGATIVE NEGATIVE Final    Comment:        The Xpert SA Assay (FDA approved for NASAL  specimens in patients over 26 years of age), is one component of a comprehensive surveillance program.  Test performance has been validated by [facility] for patients greater than or equal to 39 year old. It is not intended to diagnose infection nor to guide or monitor treatment.      Studies: Dg Chest 2 View  05/03/2015   CLINICAL DATA:  Acute onset of hypoxia.  Initial encounter.  EXAM: CHEST  2 VIEW  COMPARISON:  Chest radiograph performed 04/30/2015  FINDINGS: The lungs are hypoexpanded, with persistent elevation of the right hemidiaphragm. Mild bibasilar opacities likely reflect atelectasis. No pleural effusion or pneumothorax is seen. The lateral view is suboptimal due to lung hypoexpansion.  The heart is mildly enlarged. Diffuse calcification is noted along the abdominal aorta. No acute osseous abnormalities are seen. Clips are noted at the left axilla.  IMPRESSION: Lungs hypoexpanded, with persistent elevation of the right hemidiaphragm. Mild bibasilar opacities likely reflect atelectasis. Mild cardiomegaly noted.   Electronically Signed   By: Garald Balding M.D.   On: 05/03/2015 21:48   Ct Abdomen Pelvis W Contrast  05/05/2015   CLINICAL DATA:  Nausea and vomiting. Recent lower extremity surgery.  EXAM: CT ABDOMEN AND PELVIS WITH CONTRAST  TECHNIQUE: Multidetector CT imaging of the abdomen and pelvis was performed using the standard protocol following bolus administration of intravenous contrast.  CONTRAST:  121mL OMNIPAQUE IOHEXOL 300 MG/ML  SOLN  COMPARISON:  Abdominal radiographs 1 day prior. Lumbar spine radiographs 10/15/2009 CT abdomen 12/29/2006  FINDINGS: Elevation of right hemidiaphragm with adjacent atelectasis in the right lower lobe. Minimal dependent atelectasis in the left lower lobe.  Postcholecystectomy with clips in the gallbladder fossa. Prominence of the common bile duct, a common finding postcholecystectomy. Mild central intrahepatic biliary prominence. The spleen and  adrenal glands, are normal. Pancreatic atrophy without ductal dilatation or inflammation. Kidneys demonstrate symmetric enhancement and excretion without hydronephrosis. No localizing renal abnormality  Small hiatal hernia. Stomach is mildly distended with ingested contents. Mild prominent proximal small bowel loops. No obstruction with oral contrast reaching the colon. The appendix is normal. Liquid and solid stool throughout the colon without colonic wall thickening. Moderate distal colonic diverticulosis without diverticulitis. No free air, free fluid, or intra-abdominal fluid collection.  No retroperitoneal adenopathy. Abdominal aorta is normal in caliber. Mild moderate atherosclerosis of the abdominal aorta and its branches.  In the pelvis the urinary bladder is physiologically distended. Uterus is surgically absent. No adnexal mass. No pelvic free fluid. There is a 1.7 cm well-circumscribed rounded soft tissue density abutting the left gluteal cleft. There is no surrounding inflammatory change.  Compression deformity superior endplate of L4, mildly progressed from radiographs of 2011. Multilevel degenerative change  in the lumbar spine.  IMPRESSION: 1. No bowel obstruction. Stomach and proximal small bowel are mildly distended with ingested contents, suggesting ileus. 2. Small (1.7cm) well-circumscribed soft tissue nodule in the left gluteal cleft without surrounding inflammation. This may reflect a sebaceous cyst, and does not appear to communicate with the rectum or reflect a perirectal fluid collection. Correlation with physical exam recommended. 3. Chronic findings include postcholecystectomy with biliary prominence, compression deformity of L4, and diverticulosis without diverticulitis.   Electronically Signed   By: Jeb Levering M.D.   On: 05/05/2015 03:33   Dg Abd 2 Views  05/04/2015   CLINICAL DATA:  Nausea and vomiting.  Constipation.  EXAM: ABDOMEN - 2 VIEW  COMPARISON:  None.  FINDINGS:  Air-fluid levels are present in small bowel on the decubitus view. Gaseous distention of the stomach on the supine frontal radiograph. Small bowel dilation cannot be measured because it is only seen on the decubitus view. There is a moderate stool burden present. Stool is present in the rectosigmoid. Cholecystectomy clips are present in the right upper quadrant.  IMPRESSION: Likely dilated loops of small bowel with air-fluid levels on the decubitus view. This can be associated with enteritis or small bowel obstruction. Consider CT with oral and IV contrast for further evaluation.   Electronically Signed   By: Dereck Ligas M.D.   On: 05/04/2015 15:32    Scheduled Meds: . acetaminophen  650 mg Oral QID  . aspirin EC  325 mg Oral BID  . cholecalciferol  800 Units Oral Daily  . diltiazem  240 mg Oral Daily  . multivitamin with minerals  1 tablet Oral Daily  . pantoprazole  40 mg Oral BID  . polyethylene glycol  17 g Oral BID  . rOPINIRole  1 mg Oral QHS  . senna-docusate  2 tablet Oral BID  . sodium chloride  3 mL Intravenous Q12H  . spironolactone  25 mg Oral Daily   Continuous Infusions: . sodium chloride 125 mL/hr at 05/02/15 2216  . lactated ringers 50 mL/hr at 05/04/15 2000    Principal Problem:   Closed right ankle fracture Active Problems:   Pure hypercholesterolemia   Essential hypertension   GERD   Osteoarthritis   BREAST CANCER, HX OF   Constipation   Bimalleolar fracture of right ankle    Time spent: 25 minutes.     [facility] Pager [phone] . If 7PM-7AM, please contact night-coverage at [email or web address], password [facility] 05/05/2015, 5:39 PM  LOS: 5 days

## 2015-05-05 NOTE — Progress Notes (Signed)
Physical Therapy Treatment Patient Details Name: [name] MRN: [id number] DOB: 10-10-1926 Today's Date: 05/05/2015    History of Present Illness Pt is a 81 y/o F s/p fall and now ORIF Rt ankle.  Pt's PMH includes incontinence, chornic cough, compression fx, hemorrhoids, diverticulosis, osteoporosis, hematuria, DM, back surgery, shoulder surgery, carpal tunnel release.    PT Comments    Mrs. Stordahl made good progress w/ therapy this session, demonstrating ability to hop 5 ft w/ RW w/ min assist.  Pt anticipating to d/c to SNF today.  Pt will benefit from continued skilled PT services to increase functional independence and safety.   Follow Up Recommendations  SNF;Supervision/Assistance - 24 hour     Equipment Recommendations  3in1 (PT)    Recommendations for Other Services       Precautions / Restrictions Precautions Precautions: Fall Precaution Comments: admitted s/p fall w/ Rt ankle fx Restrictions Weight Bearing Restrictions: Yes RLE Weight Bearing: Non weight bearing    Mobility  Bed Mobility Overal bed mobility: Modified Independent Bed Mobility: Supine to Sit     Supine to sit: Modified independent (Device/Increase time);HOB elevated     General bed mobility comments: HOB elevated and increased time w/ use of rails  Transfers Overall transfer level: Needs assistance Equipment used: Rolling walker (2 wheeled) Transfers: Sit to/from Stand Sit to Stand: Min assist         General transfer comment: Min assist to power up to standing and to maintain balance during transfer as pt w/ LOB posteriorly.  Cues to stand upright and look out window which improved pt's stability.    Ambulation/Gait Ambulation/Gait assistance: Min assist Ambulation Distance (Feet): 5 Feet Assistive device: Rolling walker (2 wheeled) Gait Pattern/deviations:  (hop on Lt LE)   Gait velocity interpretation: Below normal speed for age/gender General Gait Details: Requires min assist  to stabilize RW and to manage IV pole and O2 tank.  Pt reports "wooziness" and asks to sit down.  "Wooziness" dissipated quickly upon return to sitting.   Stairs            Wheelchair Mobility    Modified Rankin (Stroke Patients Only)       Balance Overall balance assessment: Needs assistance Sitting-balance support: Bilateral upper extremity supported;Feet supported Sitting balance-Leahy Scale: Good     Standing balance support: Bilateral upper extremity supported;During functional activity Standing balance-Leahy Scale: Poor Standing balance comment: RW and min Assist for support                    Cognition Arousal/Alertness: Awake/alert Behavior During Therapy: WFL for tasks assessed/performed Overall Cognitive Status: Within Functional Limits for tasks assessed                      Exercises General Exercises - Lower Extremity Ankle Circles/Pumps: AROM;Left;15 reps;Seated Quad Sets: Strengthening;Both;10 reps;Seated Long Arc Quad: Strengthening;Both;10 reps;Seated    General Comments        Pertinent Vitals/Pain Pain Assessment: 0-10 Pain Score: 3  Pain Location: Rt ankle/foot Pain Descriptors / Indicators: Aching;Constant Pain Intervention(s): Limited activity within patient's tolerance;Monitored during session;Repositioned    Home Living                      Prior Function            PT Goals (current goals can now be found in the care plan section) Acute Rehab PT Goals Patient Stated Goal: to get home so  she can get back to her routine PT Goal Formulation: With patient Time For Goal Achievement: 05/14/15 Potential to Achieve Goals: Good Progress towards PT goals: Progressing toward goals    Frequency  Min 3X/week    PT Plan Current plan remains appropriate    Co-evaluation             End of Session Equipment Utilized During Treatment: Gait belt;Oxygen Activity Tolerance: Treatment limited secondary to  medical complications (Comment);Patient limited by fatigue (c/o "wooziness") Patient left: in chair;with call bell/phone within reach     Time: 1113-1127 PT Time Calculation (min) (ACUTE ONLY): 14 min  Charges:  $Therapeutic Activity: 8-22 mins                    G Codes:      Joslyn Hy PT, DPT [phone] Pager: [phone] 05/05/2015, 11:51 AM

## 2015-05-05 NOTE — Op Note (Signed)
   Date of Surgery: 05/05/2015  INDICATIONS: [name] is a 81 y.o.-year-old female who sustained a right ankle fracture; she was indicated for open reduction and internal fixation due to the displaced nature of the articular fracture and came to the operating room today for this procedure. The patient did consent to the procedure after discussion of the risks and benefits.  PREOPERATIVE DIAGNOSIS: right bimalleolar ankle fracture  POSTOPERATIVE DIAGNOSIS: Same.  PROCEDURE: Open treatment of right ankle fracture with internal fixation. Bimalleolar CPT [phone]  SURGEON: N. Eduard Roux, M.D.  ASSIST: Laure Kidney, RNFA.  ANESTHESIA:  general, regional  TOURNIQUET TIME: less than 90 minutes  IV FLUIDS AND URINE: See anesthesia.  ESTIMATED BLOOD LOSS: minimal mL.  IMPLANTS: [employer] Variax semitubular plate  COMPLICATIONS: None.  DESCRIPTION OF PROCEDURE: The patient was brought to the operating room and placed supine on the operating table.  The patient had been signed prior to the procedure and this was documented. The patient had the anesthesia placed by the anesthesiologist.  A nonsterile tourniquet was placed on the upper thigh.  The prep verification and incision time-outs were performed to confirm that this was the correct patient, site, side and location. The patient had an SCD on the opposite lower extremity. The patient did receive antibiotics prior to the incision and was re-dosed during the procedure as needed at indicated intervals.  The patient had the lower extremity prepped and draped in the standard surgical fashion.  The extremity was exsanguinated using an esmarch bandage and the tourniquet was inflated to 300 mm Hg.  A lateral incision was made over the distal fibula.  Full thickness flaps were created.  The superficial peroneal nerve was protected.  The periosteum was elevated off of the fibula from the fracture site.  Organized hematoma was removed.  The fracture had  significant comminution with multiple butterfly fragments that were not conducive for lagging.  Therefore, a straight long semitubular plate was used to bridge the fracture.  We used locking screws due to her poor bone quality.  Fluosocopy was used to confirm adequate reduction and plate placement.  We then turned our attention to the medial malleolus fracture.  The fracture had been indirectly reduced by reduction of the fibula.  Given the small size of the medial malleolar fragment, we elected to only place 1 screw across the fracture.  Two parallel K wires were placed in order to control rotation while compressing the fracture with the cannulated screw.  Fluoroscopy was again used to confirm appropriate reduction and placement of the screw.  Final xrays were taken.  The wounds were thoroughly irrigated with normal saline and closed in a layered fashion using 2.0 vicryl and 3.0 nylon.  Sterile dressings were applied and the leg was immobilized in a short leg splint.  She tolerated the procedure well and was extubated and transferred to the PACU in stable condition.  POSTOPERATIVE PLAN: Ms. Knoke will remain nonweightbearing on this leg for approximately 6 weeks; Ms. Valadez will return for suture removal in 2 weeks.  He will be immobilized in a short leg splint and then transitioned to a CAM walker at his first follow up appointment.  Ms. Prete will receive DVT prophylaxis based on other medications, activity level, and risk ratio of bleeding to thrombosis.  Azucena Cecil, MD [facility] [phone] 3:07 PM

## 2015-05-05 NOTE — Anesthesia Postprocedure Evaluation (Signed)
  Anesthesia Post-op Note  Patient: [name]  Procedure(s) Performed: Procedure(s): OPEN REDUCTION INTERNAL FIXATION (ORIF) ANKLE FRACTURE/BIMALL (Right)  Patient Location: PACU  Anesthesia Type:GA combined with regional for post-op pain  Level of Consciousness: awake and alert   Airway and Oxygen Therapy: Patient Spontanous Breathing  Post-op Pain: none  Post-op Assessment: Post-op Vital signs reviewed     RLE Motor Response: Responds to commands, Purposeful movement RLE Sensation: Full sensation      Post-op Vital Signs: Reviewed  Last Vitals:  Filed Vitals:   05/05/15 0533  BP: 144/64  Pulse: 101  Temp: 36.8 C  Resp: 16    Complications: No apparent anesthesia complications

## 2015-05-06 DIAGNOSIS — I10 Essential (primary) hypertension: Secondary | ICD-10-CM

## 2015-05-06 LAB — CBC
HCT: 34.5 % — ABNORMAL LOW (ref 36.0–46.0)
HEMOGLOBIN: 11.6 g/dL — AB (ref 12.0–15.0)
MCH: 30.8 pg (ref 26.0–34.0)
MCHC: 33.6 g/dL (ref 30.0–36.0)
MCV: 91.5 fL (ref 78.0–100.0)
Platelets: 290 10*3/uL (ref 150–400)
RBC: 3.77 MIL/uL — AB (ref 3.87–5.11)
RDW: 13.8 % (ref 11.5–15.5)
WBC: 10.3 10*3/uL (ref 4.0–10.5)

## 2015-05-06 LAB — PROCALCITONIN: Procalcitonin: 0.33 ng/mL

## 2015-05-06 MED ORDER — LEVOFLOXACIN 500 MG PO TABS: 500.0000 mg | ORAL_TABLET | Freq: Every day | ORAL | Status: DC

## 2015-05-06 MED ORDER — ASPIRIN EC 81 MG PO TBEC: 81.0000 mg | DELAYED_RELEASE_TABLET | Freq: Every day | ORAL | Status: DC

## 2015-05-06 MED ORDER — TRAMADOL HCL 50 MG PO TABS: 50.0000 mg | ORAL_TABLET | Freq: Four times a day (QID) | ORAL | Status: DC | PRN

## 2015-05-06 MED ORDER — ASPIRIN 325 MG PO TBEC: 325.0000 mg | DELAYED_RELEASE_TABLET | Freq: Two times a day (BID) | ORAL | Status: AC

## 2015-05-06 MED ORDER — LEVOFLOXACIN 500 MG PO TABS
500.0000 mg | ORAL_TABLET | Freq: Every day | ORAL | Status: DC
  Administered 2015-05-06: 500 mg via ORAL
  Filled 2015-05-06: qty 1

## 2015-05-06 MED ORDER — SENNOSIDES-DOCUSATE SODIUM 8.6-50 MG PO TABS: 2.0000 | ORAL_TABLET | Freq: Two times a day (BID) | ORAL | Status: DC

## 2015-05-06 NOTE — Discharge Summary (Signed)
Physician Discharge Summary  [name] NAT:[id number] DOB: 1926/11/21 DOA: 04/30/2015  PCP: Loura Pardon, MD  Admit date: 04/30/2015 Discharge date: 05/06/2015  Time spent: 30 minutes  Recommendations for Outpatient Follow-up:  1. Follow up with orthopedics in 2 weeks for suture removal,  2. NWB on the right ankle for 6 weeks.  3. Follow uw ith PCP in one week.   Discharge Diagnoses:  Principal Problem:   Closed right ankle fracture Active Problems:   Pure hypercholesterolemia   Essential hypertension   GERD   Osteoarthritis   BREAST CANCER, HX OF   Constipation   Bimalleolar fracture of right ankle   Discharge Condition: improved   Diet recommendation: low sodium diet.   Filed Weights   05/03/15 0500 05/05/15 0500 05/06/15 0500  Weight: 81.103 kg (178 lb 12.8 oz) 77.111 kg (170 lb) 76.567 kg (168 lb 12.8 oz)    History of present illness:   [name] is a 81 y.o. female, with hypertension, GERD, osteoporosis and a history of breast cancer and endometrial cancer. She presents the emergency department with a right ankle fracture. She underwent ORIF on 8/19. On 8/20 she reported constipation and an abdominal film shows dilated loops concerning for SBO. A repeat CT abdomen and pelvis ordered, does not show any obstruction, probably ileus, which appears to have resolved.   Hospital Course:  1. Right ankle fracture: - s/p ORIF on 8/19.  - pain control.  - SNF on discharge.  - NWB for 6 weeks, followed by suture removal in 2 weeks at orthopedics office.  - on aspirin 325 mg twice daily.     Chronic diastolic heart failure: Compensated.  Echocardiogram ordered showed good systolic function, diastolic dysfunction    Hypertension  Controlled.   Hypoxia: - from hypoventilation.  - get CXR shows basilar atelectasis.  - on 1 lit of [place] oxygen.  -plan to wean her off the oxygen in the next 2 days.   Constipation: - abd film shows dilated loops of small  bowel, a CT abdomen and pelvis ordered. It showed ileus.  - laxatives and stool softeners ordered. She reports having a good BM.   Leukocytosis: - unclear etiology.  - probably reactive.  - resolved.   Mild hyponatremia. Asymptomatic.   Procedures:  ORIF of the right ankle  Consultations:  Orthopedics.   Discharge Exam: Filed Vitals:   05/06/15 1321  BP: 141/56  Pulse: 104  Temp: 98.1 F (36.7 C)  Resp: 16    General: alert afebrile comfortable Cardiovascular: s1s2 Respiratory: ctab  Discharge Instructions   Discharge Instructions    Diet - low sodium heart healthy    Complete by:  As directed      Discharge instructions    Complete by:  As directed   FOLLOW UP with orthopedics as recommended.          Current Discharge Medication List    START taking these medications   Details  levofloxacin (LEVAQUIN) 500 MG tablet Take 1 tablet (500 mg total) by mouth daily. Qty: 5 tablet, Refills: 0    senna-docusate (SENOKOT-S) 8.6-50 MG per tablet Take 2 tablets by mouth 2 (two) times daily.    traMADol (ULTRAM) 50 MG tablet Take 1 tablet (50 mg total) by mouth every 6 (six) hours as needed for severe pain. Qty: 20 tablet, Refills: 0      CONTINUE these medications which have CHANGED   Details  !! aspirin EC 325 MG EC tablet Take 1 tablet (  325 mg total) by mouth 2 (two) times daily. Qty: 30 tablet, Refills: 0    !! aspirin EC 81 MG tablet Take 1 tablet (81 mg total) by mouth daily.     !! - Potential duplicate medications found. Please discuss with provider.    CONTINUE these medications which have NOT CHANGED   Details  acetaminophen (TYLENOL) 650 MG CR tablet Take 1,300 mg by mouth 2 (two) times daily.      CALCIUM PO Take 500 mg by mouth 3 (three) times daily.     CARTIA XT 240 MG 24 hr capsule TAKE 1 CAPSULE EVERY DAY Qty: 90 capsule, Refills: 3    chlorpheniramine (CHLOR-TRIMETON) 4 MG tablet Take 4 mg by mouth 2 (two) times daily as needed for  allergies.    Cholecalciferol (VITAMIN D PO) Take 800 Units by mouth daily.     docusate sodium (COLACE) 100 MG capsule Take 100 mg by mouth daily as needed for mild constipation.     Multiple Vitamin (MULITIVITAMIN WITH MINERALS) TABS Take 1 tablet by mouth daily.    omeprazole (PRILOSEC) 40 MG capsule TAKE 1 CAPSULE TWICE DAILY Qty: 180 capsule, Refills: 3    ondansetron (ZOFRAN) 4 MG tablet Take 4 mg by mouth every 8 (eight) hours as needed for nausea or vomiting.    rOPINIRole (REQUIP) 1 MG tablet TAKE 1 TABLET EVERY DAY Qty: 90 tablet, Refills: 0    spironolactone (ALDACTONE) 25 MG tablet TAKE 1 TABLET EVERY DAY Qty: 90 tablet, Refills: 3       Allergies  Allergen Reactions  . Amoxicillin-Pot Clavulanate Nausea Only  . Clarithromycin Nausea Only  . Codeine Nausea Only  . Sulfonamide Derivatives Nausea Only  . Tetracycline Nausea Only   Follow-up Information    Follow up with Marianna Payment, MD In 2 weeks.   Specialty:  Orthopedic Surgery   Why:  For suture removal, For wound re-check   Contact information:   [address] [place] [id number] [phone]       Follow up with Loura Pardon, MD. Schedule an appointment as soon as possible for a visit in 1 week.   Specialties:  Family Medicine, Radiology   Contact information:   [address] [place] [address] [phone]        The results of significant diagnostics from this hospitalization (including imaging, microbiology, ancillary and laboratory) are listed below for reference.    Significant Diagnostic Studies: Dg Chest 1 View  04/30/2015   CLINICAL DATA:  Fall with right ankle fracture.  Preop.  EXAM: CHEST  1 VIEW  COMPARISON:  06/02/2013  FINDINGS: Chronic elevation of the right diaphragm.  No cardiomegaly. Negative aortic and hilar contours. There is no edema, consolidation, effusion, or pneumothorax. Left axillary dissection. Cholecystectomy clips.  Calcified granulomas over the left lung.  IMPRESSION: 1. No active disease. 2. Chronic elevation of the right diaphragm.   Electronically Signed   By: Monte Fantasia M.D.   On: 04/30/2015 04:26   Dg Chest 2 View  05/03/2015   CLINICAL DATA:  Acute onset of hypoxia.  Initial encounter.  EXAM: CHEST  2 VIEW  COMPARISON:  Chest radiograph performed 04/30/2015  FINDINGS: The lungs are hypoexpanded, with persistent elevation of the right hemidiaphragm. Mild bibasilar opacities likely reflect atelectasis. No pleural effusion or pneumothorax is seen. The lateral view is suboptimal due to lung hypoexpansion.  The heart is mildly enlarged. Diffuse calcification is noted along  the abdominal aorta. No acute osseous abnormalities are seen. Clips are noted at the left axilla.  IMPRESSION: Lungs hypoexpanded, with persistent elevation of the right hemidiaphragm. Mild bibasilar opacities likely reflect atelectasis. Mild cardiomegaly noted.   Electronically Signed   By: Garald Balding M.D.   On: 05/03/2015 21:48   Dg Ankle Complete Right  05/02/2015   CLINICAL DATA:  ORIF of distal tibia and fibular fractures  EXAM: DG C-ARM 61-120 MIN; RIGHT ANKLE - COMPLETE 3+ VIEW  COMPARISON:  None.  FLUOROSCOPY TIME:  Radiation Exposure Index (as provided by the fluoroscopic device): Not available  If the device does not provide the exposure index:  Fluoroscopy Time:  53 seconds  Number of Acquired Images:  4  FINDINGS: Fixation sideplate is noted along the distal fibula with a fixation screw traversing the medial malleolus. Fracture fragments are in anatomic alignment.  IMPRESSION: Status post ORIF of distal tibial and fibular fractures.   Electronically Signed   By: Inez Catalina M.D.   On: 05/02/2015 13:42   Dg Ankle Complete Right  04/30/2015   CLINICAL DATA:  Fall with pain in the right ankle.  EXAM: RIGHT ANKLE - COMPLETE 3+ VIEW  COMPARISON:  Initial encounter.  FINDINGS: Transverse medial malleolus fracture and oblique distal  fibular fracture that is above the ankle joint. Both fractures are displaced by up to 3 mm laterally. Otherwise, the ankle mortise appears congruent. Intact and normally aligned hindfoot.  IMPRESSION: Mildly displaced medial malleolus and distal fibula fractures.   Electronically Signed   By: Monte Fantasia M.D.   On: 04/30/2015 04:25   Ct Abdomen Pelvis W Contrast  05/05/2015   CLINICAL DATA:  Nausea and vomiting. Recent lower extremity surgery.  EXAM: CT ABDOMEN AND PELVIS WITH CONTRAST  TECHNIQUE: Multidetector CT imaging of the abdomen and pelvis was performed using the standard protocol following bolus administration of intravenous contrast.  CONTRAST:  169mL OMNIPAQUE IOHEXOL 300 MG/ML  SOLN  COMPARISON:  Abdominal radiographs 1 day prior. Lumbar spine radiographs 10/15/2009 CT abdomen 12/29/2006  FINDINGS: Elevation of right hemidiaphragm with adjacent atelectasis in the right lower lobe. Minimal dependent atelectasis in the left lower lobe.  Postcholecystectomy with clips in the gallbladder fossa. Prominence of the common bile duct, a common finding postcholecystectomy. Mild central intrahepatic biliary prominence. The spleen and adrenal glands, are normal. Pancreatic atrophy without ductal dilatation or inflammation. Kidneys demonstrate symmetric enhancement and excretion without hydronephrosis. No localizing renal abnormality  Small hiatal hernia. Stomach is mildly distended with ingested contents. Mild prominent proximal small bowel loops. No obstruction with oral contrast reaching the colon. The appendix is normal. Liquid and solid stool throughout the colon without colonic wall thickening. Moderate distal colonic diverticulosis without diverticulitis. No free air, free fluid, or intra-abdominal fluid collection.  No retroperitoneal adenopathy. Abdominal aorta is normal in caliber. Mild moderate atherosclerosis of the abdominal aorta and its branches.  In the pelvis the urinary bladder is  physiologically distended. Uterus is surgically absent. No adnexal mass. No pelvic free fluid. There is a 1.7 cm well-circumscribed rounded soft tissue density abutting the left gluteal cleft. There is no surrounding inflammatory change.  Compression deformity superior endplate of L4, mildly progressed from radiographs of 2011. Multilevel degenerative change in the lumbar spine.  IMPRESSION: 1. No bowel obstruction. Stomach and proximal small bowel are mildly distended with ingested contents, suggesting ileus. 2. Small (1.7cm) well-circumscribed soft tissue nodule in the left gluteal cleft without surrounding inflammation. This may reflect a sebaceous cyst,  and does not appear to communicate with the rectum or reflect a perirectal fluid collection. Correlation with physical exam recommended. 3. Chronic findings include postcholecystectomy with biliary prominence, compression deformity of L4, and diverticulosis without diverticulitis.   Electronically Signed   By: Jeb Levering M.D.   On: 05/05/2015 03:33   Dg Abd 2 Views  05/04/2015   CLINICAL DATA:  Nausea and vomiting.  Constipation.  EXAM: ABDOMEN - 2 VIEW  COMPARISON:  None.  FINDINGS: Air-fluid levels are present in small bowel on the decubitus view. Gaseous distention of the stomach on the supine frontal radiograph. Small bowel dilation cannot be measured because it is only seen on the decubitus view. There is a moderate stool burden present. Stool is present in the rectosigmoid. Cholecystectomy clips are present in the right upper quadrant.  IMPRESSION: Likely dilated loops of small bowel with air-fluid levels on the decubitus view. This can be associated with enteritis or small bowel obstruction. Consider CT with oral and IV contrast for further evaluation.   Electronically Signed   By: Dereck Ligas M.D.   On: 05/04/2015 15:32   Dg Foot Complete Right  04/30/2015   CLINICAL DATA:  Fall with pain of right first toe.  Insert image  EXAM: RIGHT FOOT  COMPLETE - 3+ VIEW  COMPARISON:  None.  FINDINGS: There is a nondisplaced fracture involving the distal aspect of the first proximal phalanx adjacent to the second toe and appearing to possibly just extend into the interphalangeal joint. No dislocation. Severe degenerative changes are seen at the first MTP joint with associated hallux valgus. No bony lesions or destruction identified.  IMPRESSION: Nondisplaced fracture involving the distal aspect of the right first proximal phalanx. Significant degenerative changes of the first MTP joint.   Electronically Signed   By: Aletta Edouard M.D.   On: 04/30/2015 08:04   Dg C-arm 1-60 Min  05/02/2015   CLINICAL DATA:  ORIF of distal tibia and fibular fractures  EXAM: DG C-ARM 61-120 MIN; RIGHT ANKLE - COMPLETE 3+ VIEW  COMPARISON:  None.  FLUOROSCOPY TIME:  Radiation Exposure Index (as provided by the fluoroscopic device): Not available  If the device does not provide the exposure index:  Fluoroscopy Time:  53 seconds  Number of Acquired Images:  4  FINDINGS: Fixation sideplate is noted along the distal fibula with a fixation screw traversing the medial malleolus. Fracture fragments are in anatomic alignment.  IMPRESSION: Status post ORIF of distal tibial and fibular fractures.   Electronically Signed   By: Inez Catalina M.D.   On: 05/02/2015 13:42   Dg Hip Unilat With Pelvis 2-3 Views Right  04/30/2015   CLINICAL DATA:  Fall with right ankle and leg pain. Initial encounter.  EXAM: DG HIP (WITH OR WITHOUT PELVIS) 2-3V RIGHT  COMPARISON:  None.  FINDINGS: There is no evidence of hip fracture or dislocation. No evidence of focal bone lesion. No notable degenerative change.  IMPRESSION: Negative.   Electronically Signed   By: Monte Fantasia M.D.   On: 04/30/2015 04:22    Microbiology: Recent Results (from the past 240 hour(s))  Urine culture     Status: None   Collection Time: 04/30/15  6:09 AM  Result Value Ref Range Status   Specimen Description URINE, CLEAN  CATCH  Final   Special Requests NONE  Final   Culture MULTIPLE SPECIES PRESENT, SUGGEST RECOLLECTION  Final   Report Status 05/02/2015 FINAL  Final  Surgical pcr screen  Status: None   Collection Time: 05/01/15 10:02 AM  Result Value Ref Range Status   MRSA, PCR NEGATIVE NEGATIVE Final   Staphylococcus aureus NEGATIVE NEGATIVE Final    Comment:        The Xpert SA Assay (FDA approved for NASAL specimens in patients over 82 years of age), is one component of a comprehensive surveillance program.  Test performance has been validated by [facility] for patients greater than or equal to 68 year old. It is not intended to diagnose infection nor to guide or monitor treatment.      Labs: Basic Metabolic Panel:  Recent Labs Lab 04/30/15 0330 05/01/15 0655 05/04/15 0643 05/05/15 0427  NA 135 136 134* 132*  K 4.4 4.1 4.4 3.7  CL 99* 100* 93* 93*  CO2 26 28 28 28   GLUCOSE 120* 116* 127* 116*  BUN 19 12 17  31*  CREATININE 0.97 0.89 0.94 0.91  CALCIUM 9.6 9.3 9.1 8.0*   Liver Function Tests: No results for input(s): AST, ALT, ALKPHOS, BILITOT, PROT, ALBUMIN in the last 168 hours. No results for input(s): LIPASE, AMYLASE in the last 168 hours. No results for input(s): AMMONIA in the last 168 hours. CBC:  Recent Labs Lab 04/30/15 0330 05/01/15 0655 05/04/15 0643 05/05/15 0427 05/06/15 1157  WBC 15.1* 10.3 16.2* 18.9* 10.3  NEUTROABS 13.1*  --   --   --   --   HGB 13.5 13.9 13.2 11.8* 11.6*  HCT 39.5 41.7 40.1 35.0* 34.5*  MCV 91.6 93.7 94.6 91.4 91.5  PLT 311 280 265 275 290   Cardiac Enzymes: No results for input(s): CKTOTAL, CKMB, CKMBINDEX, TROPONINI in the last 168 hours. BNP: BNP (last 3 results) No results for input(s): BNP in the last 8760 hours.  ProBNP (last 3 results) No results for input(s): PROBNP in the last 8760 hours.  CBG:  Recent Labs Lab 05/02/15 1401  GLUCAP 105*       Signed:  Leyland Kenna  [facility] 05/06/2015,  2:07 PM

## 2015-05-06 NOTE — Progress Notes (Signed)
Patient sitting up eating breakfast.  No acute distress. Toes wwp, well fitting splint She is stable for dc to SNF from my stand point  N. Eduard Roux, MD [place] 7:46 AM

## 2015-05-06 NOTE — Clinical Social Work Placement (Signed)
   CLINICAL SOCIAL WORK PLACEMENT  NOTE  Date:  05/06/2015  Patient Details  Name: [name] MRN: [id number] Date of Birth: 1927/02/26  Clinical Social Work is seeking post-discharge placement for this patient at the [place] level of care (*CSW will initial, date and re-position this form in  chart as items are completed):  Yes   Patient/family provided with [facility] Work Department's list of facilities offering this level of care within the geographic area requested by the patient (or if unable, by the patient's family).  Yes   Patient/family informed of their freedom to choose among providers that offer the needed level of care, that participate in Medicare, Medicaid or managed care program needed by the patient, have an available bed and are willing to accept the patient.  Yes   Patient/family informed of [facility]'s ownership interest in [facility] and [facility], as well as of the fact that they are under no obligation to receive care at these facilities.  PASRR submitted to EDS on 05/03/15     PASRR number received on 05/03/15     Existing PASRR number confirmed on       FL2 transmitted to all facilities in geographic area requested by pt/family on 05/03/15     FL2 transmitted to all facilities within larger geographic area on       Patient informed that his/her managed care company has contracts with or will negotiate with certain facilities, including the following:        Yes   Patient/family informed of bed offers received.  Patient chooses bed at [facility]     Physician recommends and patient chooses bed at  (n/a)    Patient to be transferred to [facility] on 05/06/15.  Patient to be transferred to facility by [facility]     Patient family notified on 05/06/15 of transfer.  Name of family member notified:  Patient and patient's sister at bedside.     PHYSICIAN       Additional Comment:     _______________________________________________ Caroline Sauger, LCSW 05/06/2015, 3:17 PM [phone]

## 2015-05-06 NOTE — Discharge Planning (Signed)
Patient to be discharged to [facility]. Patient and patient's sister updated at bedside.  Facility: [facility] RN report number: [phone] Transportation: EMS  Lubertha Sayres, [place] [phone]) and Surgical [phone])

## 2015-05-06 NOTE — Progress Notes (Signed)
Occupational Therapy Treatment Patient Details Name: [name] MRN: [id number] DOB: 05/20/1927 Today's Date: 05/06/2015    History of present illness Pt is a 81 y/o F s/p fall and now ORIF Rt ankle.  Pt's PMH includes incontinence, chornic cough, compression fx, hemorrhoids, diverticulosis, osteoporosis, hematuria, DM, back surgery, shoulder surgery, carpal tunnel release.   OT comments  Patient progressing nicely towards OT goals, continue plan of care for now. Pt overall min guard->min assist for functional transfers using RW. Discussed importance of modifying house prior to patient discharging > home. Pt eager to d/c->SNF today.   Follow Up Recommendations  SNF;Supervision/Assistance - 24 hour    Equipment Recommendations  Other (comment) (TBD next venue of care)    Recommendations for Other Services  None at this time   Precautions / Restrictions Precautions Precautions: Fall Precaution Comments: admitted s/p fall w/ Rt ankle fx Restrictions Weight Bearing Restrictions: Yes RLE Weight Bearing: Non weight bearing    Mobility Bed Mobility Overal bed mobility: Modified Independent Bed Mobility: Supine to Sit;Sit to Supine     Supine to sit: Modified independent (Device/Increase time);HOB elevated     General bed mobility comments: Minimal use of rails.  Transfers Overall transfer level: Needs assistance Equipment used: Rolling walker (2 wheeled) Transfers: Sit to/from [employer] Sit to Stand: Min assist Stand pivot transfers: Min guard Squat pivot transfers: Min guard     General transfer comment: Min assist to maintain sit<>stand balance while using RW. Min guard for squat pivot from EOB > BSC and min guard for stand pivot BSC>EOB using RW. Cues to adhere to NWB>RLE.     Balance Overall balance assessment: Needs assistance Sitting-balance support: No upper extremity supported;Single extremity supported Sitting balance-Leahy Scale: Good      Standing balance support: Bilateral upper extremity supported;During functional activity Standing balance-Leahy Scale: Poor   ADL Overall ADL's : Needs assistance/impaired General ADL Comments: Pt found supine in bed. Pt stated she felt like she had an accident in the bed. No accident noticed by therapist. Pt engaged in bed mobility and transferred EOB>BSC with min guard assistance and minimal cueing to adhere to NWB> RLE. Pt stood using RW for toileting needs with cueing. Pt then transferred back to EOB>supine. Pt eager to d/c> SNF today. Encouraged timed toielting due to patient with h/o incontinence.      Cognition   Behavior During Therapy: WFL for tasks assessed/performed Overall Cognitive Status: Within Functional Limits for tasks assessed   Pertinent Vitals/ Pain       Pain Assessment: 0-10 Pain Score: 2  Pain Location: RLE Pain Descriptors / Indicators: Aching;Sore Pain Intervention(s): Monitored during session;Repositioned   Frequency Min 2X/week     Progress Toward Goals  OT Goals(current goals can now befound in the care plan section)  Progress towards OT goals: Progressing toward goals     Plan Discharge plan remains appropriate    End of Session Equipment Utilized During Treatment: Gait belt;Rolling walker;Oxygen   Activity Tolerance Patient tolerated treatment well   Patient Left in bed;with call bell/phone within reach;with family/visitor present    Time: 7622-6333 OT Time Calculation (min): 19 min  Charges: OT General Charges $OT Visit: 1 Procedure OT Treatments $Self Care/Home Management : 8-22 mins  Joneric Streight , MS, OTR/L, CLT Pager: [phone]  05/06/2015, 4:35 PM

## 2015-05-06 NOTE — Progress Notes (Signed)
Report given to RN assuming patient care and report called to [facility]. Awaiting [facility] transport.

## 2015-05-07 ENCOUNTER — Non-Acute Institutional Stay (SKILLED_NURSING_FACILITY): Payer: Medicare Other | Admitting: Internal Medicine

## 2015-05-07 ENCOUNTER — Other Ambulatory Visit: Payer: Self-pay

## 2015-05-07 ENCOUNTER — Encounter: Payer: Self-pay | Admitting: Internal Medicine

## 2015-05-07 DIAGNOSIS — S82891A Other fracture of right lower leg, initial encounter for closed fracture: Secondary | ICD-10-CM

## 2015-05-07 DIAGNOSIS — I10 Essential (primary) hypertension: Secondary | ICD-10-CM | POA: Diagnosis not present

## 2015-05-07 DIAGNOSIS — E871 Hypo-osmolality and hyponatremia: Secondary | ICD-10-CM | POA: Diagnosis not present

## 2015-05-07 DIAGNOSIS — D62 Acute posthemorrhagic anemia: Secondary | ICD-10-CM | POA: Diagnosis not present

## 2015-05-07 DIAGNOSIS — R21 Rash and other nonspecific skin eruption: Secondary | ICD-10-CM | POA: Diagnosis not present

## 2015-05-07 DIAGNOSIS — R0981 Nasal congestion: Secondary | ICD-10-CM

## 2015-05-07 DIAGNOSIS — J9811 Atelectasis: Secondary | ICD-10-CM | POA: Diagnosis not present

## 2015-05-07 DIAGNOSIS — I5032 Chronic diastolic (congestive) heart failure: Secondary | ICD-10-CM | POA: Diagnosis not present

## 2015-05-07 DIAGNOSIS — K219 Gastro-esophageal reflux disease without esophagitis: Secondary | ICD-10-CM | POA: Diagnosis not present

## 2015-05-07 DIAGNOSIS — R2681 Unsteadiness on feet: Secondary | ICD-10-CM

## 2015-05-07 DIAGNOSIS — K5900 Constipation, unspecified: Secondary | ICD-10-CM | POA: Diagnosis not present

## 2015-05-07 MED ORDER — TRAMADOL HCL 50 MG PO TABS: 50.0000 mg | ORAL_TABLET | Freq: Four times a day (QID) | ORAL | Status: AC | PRN

## 2015-05-07 NOTE — Progress Notes (Signed)
Patient ID: Nathen May, female   DOB: 26-Oct-1926, 81 y.o.   MRN: [id number]      [facility]  PCP: Loura Pardon, MD  Code Status: Full  Allergies  Allergen Reactions  . Amoxicillin-Pot Clavulanate Nausea Only  . Clarithromycin Nausea Only  . Codeine Nausea Only  . Sulfonamide Derivatives Nausea Only  . Tetracycline Nausea Only    Chief Complaint  Patient presents with  . New Admit To SNF    New Admit from Hospital     HPI:  81 y.o. patient is here for short term rehabilitation post hospital admission from 04/30/15-05/06/15 post fall with  bimalleolar fracture of right ankle. She underwent ORIF and had ileus post op. She is here for rehabilitation, seen in her room sitting on her wheelchair and watching television. Her pain is under control with current regimen. She had bowel movement this am. Denies any concerns.  Review of Systems:  Constitutional: Negative for fever, chills, diaphoresis.  HENT: positive for nasal stuffiness. Negative for headache, nasal discharge  Eyes: Negative for eye pain, blurred vision, double vision and discharge.  Respiratory: Positive for cough with clear phlegm and dyspnea on exertion. Negative for wheezing.   Cardiovascular: Negative for chest pain, palpitations, leg swelling.  Gastrointestinal: Negative for heartburn, nausea, vomiting, abdominal pain. Genitourinary: Negative for dysuria, flank pain.  Musculoskeletal: Negative for back pain, falls in facility Skin: Negative for itching, rash.  Neurological: Negative for dizziness, tingling, focal weakness Psychiatric/Behavioral: Negative for depression   Past Medical History  Diagnosis Date  . HTN (hypertension)   . GERD (gastroesophageal reflux disease)   . Hyperglycemia   . Allergic rhinitis   . Chronic cough   . Compression fracture   . Diverticulosis   . Hemorrhoids   . Osteoporosis     fosamax in past/ followed by Dr Lorin Mercy   . Hematuria   . Family history of adverse  reaction to anesthesia     "my brother broke femur in 2014; he never really came out of anesthesia; was ok before the OR"  . Endometrial cancer   . Uterine cancer     S/P hysterectomy  . Breast cancer, left breast 12/99    S/P mastectomy  . Basal cell carcinoma of buttock   . Diabetes mellitus     borderline  . Arthritis     "right thumb; some in my back" (05/01/2015)  . Urinary incontinence    Past Surgical History  Procedure Laterality Date  . Total abdominal hysterectomy w/ bilateral salpingoophorectomy      endometrial cancer  . Mastectomy Left   . Basal cell carcinoma excision  10/08    "my rear end"  . Cataract extraction w/ intraocular lens  implant, bilateral  ~ 2010-2012  . Dexa  2/00    LS osteoporsis  . Dexa  2/02    same; OP, LS, spine, hip  . Dexa  3/04    BMD spine; low BMD hip  . Colonoscopy  3/02    Diverticulosis; hemorrhoids  . Dilation and curettage of uterus    . Shoulder surgery Right     "fixed a muscle; not rotator cuff"  . Carpal tunnel release Right   . Laparoscopic cholecystectomy  2009  . Total abdominal hysterectomy      TAH BSO  . Back surgery  2007    Dr. Lorin Mercy -disk (pt denies this hx on 05/01/2015)  . Orif ankle fracture Right 05/02/2015    Procedure: OPEN  REDUCTION INTERNAL FIXATION (ORIF) ANKLE FRACTURE/BIMALL;  Surgeon: Leandrew Koyanagi, MD;  Location: [place];  Service: Orthopedics;  Laterality: Right;   Social History:   reports that she has never smoked. She has never used smokeless tobacco. She reports that she does not drink alcohol or use illicit drugs.  Family History  Problem Relation Age of Onset  . Colon cancer Brother   . Breast cancer Sister   . Breast cancer Other     Medications:   Medication List       This list is accurate as of: 05/07/15 10:35 AM.  Always use your most recent med list.               acetaminophen 650 MG CR tablet  Commonly known as:  TYLENOL  Take 1,300 mg by mouth 2 (two) times daily.      aspirin 325 MG EC tablet  Take 1 tablet (325 mg total) by mouth 2 (two) times daily.     CALCIUM PO  Take 500 mg by mouth 3 (three) times daily.     CARTIA XT 240 MG 24 hr capsule  Generic drug:  diltiazem  TAKE 1 CAPSULE EVERY DAY     chlorpheniramine 4 MG tablet  Commonly known as:  CHLOR-TRIMETON  Take 4 mg by mouth 2 (two) times daily as needed for allergies.     docusate sodium 100 MG capsule  Commonly known as:  COLACE  Take 100 mg by mouth daily as needed for mild constipation.     levofloxacin 500 MG tablet  Commonly known as:  LEVAQUIN  Take 1 tablet (500 mg total) by mouth daily.     multivitamin with minerals Tabs tablet  Take 1 tablet by mouth daily.     omeprazole 40 MG capsule  Commonly known as:  PRILOSEC  TAKE 1 CAPSULE TWICE DAILY     ondansetron 4 MG tablet  Commonly known as:  ZOFRAN  Take 4 mg by mouth every 8 (eight) hours as needed for nausea or vomiting.     rOPINIRole 1 MG tablet  Commonly known as:  REQUIP  TAKE 1 TABLET EVERY DAY     senna-docusate 8.6-50 MG per tablet  Commonly known as:  Senokot-S  Take 2 tablets by mouth 2 (two) times daily.     spironolactone 25 MG tablet  Commonly known as:  ALDACTONE  TAKE 1 TABLET EVERY DAY     traMADol 50 MG tablet  Commonly known as:  ULTRAM  Take 1 tablet (50 mg total) by mouth every 6 (six) hours as needed for severe pain.     VITAMIN D PO  Take 800 Units by mouth daily.         Physical Exam: Filed Vitals:   05/07/15 1019  BP: 149/70  Pulse: 95  Temp: 98 F (36.7 C)  TempSrc: Oral  Resp: 18  SpO2: 95%    General- elderly female, in no acute distress Head- normocephalic, atraumatic Nose- normal nasal mucosa, no maxillary or frontal sinus tenderness, no nasal discharge Throat- moist mucus membrane Eyes- PERRLA, EOMI, no pallor, no icterus Neck- no cervical lymphadenopathy Cardiovascular- normal s1,s2, no murmurs Respiratory- bilateral clear to auscultation, no wheeze, no  rhonchi, no crackles, no use of accessory muscles, on o2 Abdomen- bowel sounds present, soft, non tender Musculoskeletal- able to move all 4 extremities, right leg in cast and ace wrap, able to wiggle her toes, good capillary refill noted Neurological- no focal deficit, alert and  oriented  Skin- warm and dry Psychiatry- normal mood and affect    Labs reviewed: Basic Metabolic Panel:  Recent Labs  05/01/15 0655 05/04/15 0643 05/05/15 0427  NA 136 134* 132*  K 4.1 4.4 3.7  CL 100* 93* 93*  CO2 28 28 28   GLUCOSE 116* 127* 116*  BUN 12 17 31*  CREATININE 0.89 0.94 0.91  CALCIUM 9.3 9.1 8.0*   Liver Function Tests:  Recent Labs  05/15/14 0759  AST 25  ALT 16  ALKPHOS 50  BILITOT 0.6  PROT 7.9  ALBUMIN 4.1   No results for input(s): LIPASE, AMYLASE in the last 8760 hours. No results for input(s): AMMONIA in the last 8760 hours. CBC:  Recent Labs  05/15/14 0759 04/30/15 0330  05/04/15 0643 05/05/15 0427 05/06/15 1157  WBC 7.7 15.1*  < > 16.2* 18.9* 10.3  NEUTROABS 5.2 13.1*  --   --   --   --   HGB 14.0 13.5  < > 13.2 11.8* 11.6*  HCT 42.3 39.5  < > 40.1 35.0* 34.5*  MCV 95.3 91.6  < > 94.6 91.4 91.5  PLT 314.0 311  < > 265 275 290  < > = values in this interval not displayed. Cardiac Enzymes: No results for input(s): CKTOTAL, CKMB, CKMBINDEX, TROPONINI in the last 8760 hours. BNP: Invalid input(s): POCBNP CBG:  Recent Labs  05/02/15 1401  GLUCAP 105*    Radiological Exams: Dg Chest 1 View  04/30/2015   CLINICAL DATA:  Fall with right ankle fracture.  Preop.  EXAM: CHEST  1 VIEW  COMPARISON:  06/02/2013  FINDINGS: Chronic elevation of the right diaphragm.  No cardiomegaly. Negative aortic and hilar contours. There is no edema, consolidation, effusion, or pneumothorax. Left axillary dissection. Cholecystectomy clips. Calcified granulomas over the left lung.  IMPRESSION: 1. No active disease. 2. Chronic elevation of the right diaphragm.   Electronically  Signed   By: Monte Fantasia M.D.   On: 04/30/2015 04:26   Dg Ankle Complete Right  04/30/2015   CLINICAL DATA:  Fall with pain in the right ankle.  EXAM: RIGHT ANKLE - COMPLETE 3+ VIEW  COMPARISON:  Initial encounter.  FINDINGS: Transverse medial malleolus fracture and oblique distal fibular fracture that is above the ankle joint. Both fractures are displaced by up to 3 mm laterally. Otherwise, the ankle mortise appears congruent. Intact and normally aligned hindfoot.  IMPRESSION: Mildly displaced medial malleolus and distal fibula fractures.   Electronically Signed   By: Monte Fantasia M.D.   On: 04/30/2015 04:25   Dg Foot Complete Right  04/30/2015   CLINICAL DATA:  Fall with pain of right first toe.  Insert image  EXAM: RIGHT FOOT COMPLETE - 3+ VIEW  COMPARISON:  None.  FINDINGS: There is a nondisplaced fracture involving the distal aspect of the first proximal phalanx adjacent to the second toe and appearing to possibly just extend into the interphalangeal joint. No dislocation. Severe degenerative changes are seen at the first MTP joint with associated hallux valgus. No bony lesions or destruction identified.  IMPRESSION: Nondisplaced fracture involving the distal aspect of the right first proximal phalanx. Significant degenerative changes of the first MTP joint.   Electronically Signed   By: Aletta Edouard M.D.   On: 04/30/2015 08:04   Dg Hip Unilat With Pelvis 2-3 Views Right  04/30/2015   CLINICAL DATA:  Fall with right ankle and leg pain. Initial encounter.  EXAM: DG HIP (WITH OR WITHOUT PELVIS) 2-3V RIGHT  COMPARISON:  None.  FINDINGS: There is no evidence of hip fracture or dislocation. No evidence of focal bone lesion. No notable degenerative change.  IMPRESSION: Negative.   Electronically Signed   By: Monte Fantasia M.D.   On: 04/30/2015 04:22    Assessment/Plan  Gait instability Here for rehabilitation. Will have her work with physical therapy and occupational therapy team to help  with gait training and muscle strengthening exercises.fall precautions. Skin care. Encourage to be out of bed.   Right ankle fracture S/p ORIF, NWB to RLE for 6 weeks, has f/u with orthopedics. Continue aspirin EC 325 mg bid for dvt prophylaxis. Continue tramadol 50 mg q6h prn pain with tylenol BID. Continue calcium and vitamin d supplement. Will have patient work with PT/OT as tolerated to regain strength and restore function.  Fall precautions are in place.  Acute blood loss anemia Post op, monitor h&h  Basilar atelectasis With hypoxic episode in hospital, currently on o2 by nasal canula. Lung exam shows CTAB. Wean off o2 as tolerated to keep o2 sat > 90% with rest and exertion. Encouraged to use incentive spirometer. Continue and complete course of levaquin 500 mg daily on 05/11/15  Nasal congestion Likely from her o2, add nasal saline spray bid to each nostril while on o2  Hyponatremia Alert and oriented, monitor bmp  Chronic diatolic heart failure Stable, continue her aldactone 25 mg daily, monitor   HTN Elevated SBP on review. Check bp q shift x 3 days, then daily for 1 week. Continue current regimen of aldactone and cartia 240 mg daily and adjust medication if needed  Constipation Stable, continue colace and senna-s, hydration encouraged  gerd Stable, continue home regimen prilosec and monitor  Upper back rash Skin care and treat with triamcinolone cream and reassess if no improvement   Goals of care: short term rehabilitation   Labs/tests ordered: cbc, bmp in 1 week  Family/ staff Communication: reviewed care plan with patient and nursing supervisor    Blanchie Serve, MD  [place] [phone] (Monday-Friday 8 am - 5 pm) [phone] (afterhours)

## 2015-05-07 NOTE — Telephone Encounter (Signed)
Rx faxed to [facility] @ [phone], phone number [phone]  

## 2015-05-21 ENCOUNTER — Other Ambulatory Visit: Payer: Medicare Other

## 2015-05-23 ENCOUNTER — Encounter: Payer: Medicare Other | Admitting: Family Medicine

## 2015-05-28 ENCOUNTER — Encounter: Payer: Medicare Other | Admitting: Family Medicine

## 2015-06-02 ENCOUNTER — Non-Acute Institutional Stay (SKILLED_NURSING_FACILITY): Payer: Medicare Other | Admitting: Nurse Practitioner

## 2015-06-02 DIAGNOSIS — I10 Essential (primary) hypertension: Secondary | ICD-10-CM | POA: Diagnosis not present

## 2015-06-02 DIAGNOSIS — S82891A Other fracture of right lower leg, initial encounter for closed fracture: Secondary | ICD-10-CM

## 2015-06-02 DIAGNOSIS — K219 Gastro-esophageal reflux disease without esophagitis: Secondary | ICD-10-CM | POA: Diagnosis not present

## 2015-06-02 DIAGNOSIS — D62 Acute posthemorrhagic anemia: Secondary | ICD-10-CM

## 2015-06-02 DIAGNOSIS — K5900 Constipation, unspecified: Secondary | ICD-10-CM

## 2015-06-02 DIAGNOSIS — E871 Hypo-osmolality and hyponatremia: Secondary | ICD-10-CM | POA: Diagnosis not present

## 2015-06-02 NOTE — Progress Notes (Signed)
Patient ID: [name], female   DOB: 1927-06-22, 81 y.o.   MRN: [id number]    Nursing Home Location:  [place] of Service: SNF (31)  PCP: Loura Pardon, MD  Allergies  Allergen Reactions  . Amoxicillin-Pot Clavulanate Nausea Only  . Clarithromycin Nausea Only  . Codeine Nausea Only  . Sulfonamide Derivatives Nausea Only  . Tetracycline Nausea Only    Chief Complaint  Patient presents with  . Discharge Note    HPI:  Patient is a 81 y.o. female seen today at [facility] for discharge home. Pt with a hx of htn, gerd, hyperlipidemia, allergic rhinnitis, OP, OA, UI. Pt is at [facility] place for short term rehabilitation post hospital admission from 04/30/15-05/06/15 post fall with bimalleolar fracture of right ankle. She underwent ORIF. Pain is controlled on current medication. Currently NWB to right ankle. Patient currently doing well with therapy, now stable to discharge home with home health.  Review of Systems:  Review of Systems  Constitutional: Negative for activity change, appetite change, fatigue and unexpected weight change.  HENT: Negative for congestion and hearing loss.   Eyes: Negative.   Respiratory: Negative for cough and shortness of breath.   Cardiovascular: Negative for chest pain, palpitations and leg swelling.  Gastrointestinal: Negative for abdominal pain, diarrhea and constipation.  Genitourinary: Negative for dysuria and difficulty urinating.  Musculoskeletal:       Pain controlled  Skin: Negative for color change and wound.  Neurological: Negative for dizziness and weakness.  Psychiatric/Behavioral: Negative for behavioral problems, confusion and agitation.    Past Medical History  Diagnosis Date  . HTN (hypertension)   . GERD (gastroesophageal reflux disease)   . Hyperglycemia   . Allergic rhinitis   . Chronic cough   . Compression fracture   . Diverticulosis   . Hemorrhoids   . Osteoporosis    fosamax in past/ followed by Dr Lorin Mercy   . Hematuria   . Family history of adverse reaction to anesthesia     "my brother broke femur in 2014; he never really came out of anesthesia; was ok before the OR"  . Endometrial cancer   . Uterine cancer     S/P hysterectomy  . Breast cancer, left breast 12/99    S/P mastectomy  . Basal cell carcinoma of buttock   . Diabetes mellitus     borderline  . Arthritis     "right thumb; some in my back" (05/01/2015)  . Urinary incontinence    Past Surgical History  Procedure Laterality Date  . Total abdominal hysterectomy w/ bilateral salpingoophorectomy      endometrial cancer  . Mastectomy Left   . Basal cell carcinoma excision  10/08    "my rear end"  . Cataract extraction w/ intraocular lens  implant, bilateral  ~ 2010-2012  . Dexa  2/00    LS osteoporsis  . Dexa  2/02    same; OP, LS, spine, hip  . Dexa  3/04    BMD spine; low BMD hip  . Colonoscopy  3/02    Diverticulosis; hemorrhoids  . Dilation and curettage of uterus    . Shoulder surgery Right     "fixed a muscle; not rotator cuff"  . Carpal tunnel release Right   . Laparoscopic cholecystectomy  2009  . Total abdominal hysterectomy      TAH BSO  . Back surgery  2007    Dr. Lorin Mercy -disk (pt denies  this hx on 05/01/2015)  . Orif ankle fracture Right 05/02/2015    Procedure: OPEN REDUCTION INTERNAL FIXATION (ORIF) ANKLE FRACTURE/BIMALL;  Surgeon: Leandrew Koyanagi, MD;  Location: [place];  Service: Orthopedics;  Laterality: Right;   Social History:   reports that she has never smoked. She has never used smokeless tobacco. She reports that she does not drink alcohol or use illicit drugs.  Family History  Problem Relation Age of Onset  . Colon cancer Brother   . Breast cancer Sister   . Breast cancer Other     Medications: Patient's Medications  New Prescriptions   No medications on file  Previous Medications   ACETAMINOPHEN (TYLENOL) 650 MG CR TABLET    Take 1,300 mg by mouth 2  (two) times daily.     CALCIUM PO    Take 500 mg by mouth 3 (three) times daily.    CARTIA XT 240 MG 24 HR CAPSULE    TAKE 1 CAPSULE EVERY DAY   CHLORPHENIRAMINE (CHLOR-TRIMETON) 4 MG TABLET    Take 4 mg by mouth 2 (two) times daily as needed for allergies.   CHOLECALCIFEROL (VITAMIN D PO)    Take 800 Units by mouth daily.    DOCUSATE SODIUM (COLACE) 100 MG CAPSULE    Take 100 mg by mouth daily as needed for mild constipation.    MULTIPLE VITAMIN (MULITIVITAMIN WITH MINERALS) TABS    Take 1 tablet by mouth daily.   OMEPRAZOLE (PRILOSEC) 40 MG CAPSULE    TAKE 1 CAPSULE TWICE DAILY   ONDANSETRON (ZOFRAN) 4 MG TABLET    Take 4 mg by mouth every 8 (eight) hours as needed for nausea or vomiting.   ROPINIROLE (REQUIP) 1 MG TABLET    TAKE 1 TABLET EVERY DAY   SENNA-DOCUSATE (SENOKOT-S) 8.6-50 MG PER TABLET    Take 2 tablets by mouth 2 (two) times daily.   SPIRONOLACTONE (ALDACTONE) 25 MG TABLET    TAKE 1 TABLET EVERY DAY   TRAMADOL (ULTRAM) 50 MG TABLET    Take 1 tablet (50 mg total) by mouth every 6 (six) hours as needed for severe pain.  Modified Medications   No medications on file  Discontinued Medications   LEVOFLOXACIN (LEVAQUIN) 500 MG TABLET    Take 1 tablet (500 mg total) by mouth daily.     Physical Exam: Filed Vitals:   06/02/15 1550  BP: 136/66  Pulse: 92  Temp: 97.1 F (36.2 C)  Resp: 20    Physical Exam  Constitutional: She is oriented to person, place, and time. She appears well-developed and well-nourished. No distress.  HENT:  Head: Normocephalic and atraumatic.  Mouth/Throat: Oropharynx is clear and moist. No oropharyngeal exudate.  Eyes: Conjunctivae are normal. Pupils are equal, round, and reactive to light.  Neck: Normal range of motion. Neck supple.  Cardiovascular: Normal rate, regular rhythm and normal heart sounds.   Pulmonary/Chest: Effort normal and breath sounds normal.  Abdominal: Soft. Bowel sounds are normal.  Musculoskeletal: She exhibits no edema or  tenderness.  Right ankle in immobilizer boot, good cap refil.  Neurological: She is alert and oriented to person, place, and time.  Skin: Skin is warm and dry. She is not diaphoretic.  Psychiatric: She has a normal mood and affect.    Labs reviewed: Basic Metabolic Panel:  Recent Labs  05/01/15 0655 05/04/15 0643 05/05/15 0427  NA 136 134* 132*  K 4.1 4.4 3.7  CL 100* 93* 93*  CO2 28 28 28   GLUCOSE  116* 127* 116*  BUN 12 17 31*  CREATININE 0.89 0.94 0.91  CALCIUM 9.3 9.1 8.0*   Liver Function Tests: No results for input(s): AST, ALT, ALKPHOS, BILITOT, PROT, ALBUMIN in the last 8760 hours. No results for input(s): LIPASE, AMYLASE in the last 8760 hours. No results for input(s): AMMONIA in the last 8760 hours. CBC:  Recent Labs  04/30/15 0330  05/04/15 0643 05/05/15 0427 05/06/15 1157  WBC 15.1*  < > 16.2* 18.9* 10.3  NEUTROABS 13.1*  --   --   --   --   HGB 13.5  < > 13.2 11.8* 11.6*  HCT 39.5  < > 40.1 35.0* 34.5*  MCV 91.6  < > 94.6 91.4 91.5  PLT 311  < > 265 275 290  < > = values in this interval not displayed. TSH: No results for input(s): TSH in the last 8760 hours. A1C: Lab Results  Component Value Date   HGBA1C 6.4* 04/30/2015   Lipid Panel: No results for input(s): CHOL, HDL, LDLCALC, TRIG, CHOLHDL, LDLDIRECT in the last 8760 hours.  CBC (without Differential) Final 05/14/2015 1:31PM TEST RESULT REF RANGE UNIT White Blood Cell (WBC) 9.6 3.8-10.8 k/uL Red Blood Cell (RBC) 3.6 3.5-5.5 M/uL Hemoglobin (HGB) L 11.0 11.4-16.0 g/dL Hematocrit (HCT) 34.3 33.0-48.0 % Mean Corpuscular Volume (MCV) 95.0 79.0-100.0 fL Mean Corpuscular HGB (MCH) 30.5 26.8-33.2 pg Mean Corpuscular HGB Conc (MCHC) 32.1 30.5-36.0 g/dL RBC Dist Width (RDW) 14.3 9.0-15.0 % Platelet 385 150-450 k/uL Basic Metabolic Panel Final 50/35/4656 2:05PM TEST RESULT REF RANGE UNIT SODIUM 141 135-146 mmol/L POTASSIUM 4.1 3.5-5.3 mmol/L CHLORIDE 104 95-109 mmol/L CO2 27.4  21.0-31.0 mmol/L Anion Gap 10 5-21 meq/L GLUCOSE 92 70-105 mg/dL UREA NITROGEN (BUN) 15 5-25 mg/dL Creatinine, Serum 0.81 0.60-1.30 mg/dL BUN/CREATININE RATIO 18.5 6.0-25.0 Ratio CALCIUM 8.9 8.2-10.5 mg/dL Glomerular Filtration Rate > 60 > 60 mls/min/1.73 m2 Glomerular Filtration Rate - African American > 60 > 60 mls/min/1.73 m2  Assessment/Plan 1. Ankle fracture, right S/p ORIF, NWB to RLE for 6 weeks and ongoing follow up with orthopedics.  Pain well controlled on  tramadol 50 mg q6h prn pain with tylenol BID.  Continue calcium and vitamin d supplement.  2. Acute blood loss anemia hgb remains stable, off ASA for DVT prophylactic, will need ongoing follow up of hgb by PCP  3. Hyponatremia Stable, Sodium at 141 on last labs  4. Essential hypertension Patients hypertension is stable; continue current regimen.  5. Gastroesophageal reflux disease without esophagitis Stable, conts on prolosec  6. Constipation, unspecified constipation type Stable, continue colace and senna-s, hydration encouraged  pt is stable for discharge-will need PT/OT/HHA per home health. DME needed includes 3n1 and WC. Rx written.  will need to follow up with PCP within 2 weeks.    Carlos American. Harle Battiest  [facility] [phone] 8 am - 5 pm) [phone] (after hours)

## 2015-06-03 ENCOUNTER — Non-Acute Institutional Stay (SKILLED_NURSING_FACILITY): Payer: Medicare Other | Admitting: Internal Medicine

## 2015-06-03 DIAGNOSIS — L03115 Cellulitis of right lower limb: Secondary | ICD-10-CM

## 2015-06-03 DIAGNOSIS — R600 Localized edema: Secondary | ICD-10-CM

## 2015-06-03 NOTE — Progress Notes (Signed)
Patient ID: [name], female   DOB: June 15, 1927, 81 y.o.   MRN: [id number]      [facility]  PCP: Loura Pardon, MD  Code Status: Full  Allergies  Allergen Reactions  . Amoxicillin-Pot Clavulanate Nausea Only  . Clarithromycin Nausea Only  . Codeine Nausea Only  . Sulfonamide Derivatives Nausea Only  . Tetracycline Nausea Only    Chief Complaint  Patient presents with  . Acute Visit    erythema with swelling to left leg     HPI:  81 y.o. patient is seen for acute concern. Staff have noticed new redness around her surgical incision area in left lower leg and swelling in her left foot both of which are new from weekend. She is here for short term rehabilitation post hospital admission with bimalleolar fracture of right ankle s/p ORIF. She denies any known trauma or fall. She denies any fever or chills. No acute worsening of her ongoing pain.    Review of Systems:  Constitutional: Negative for fever, chills, diaphoresis.  Respiratory: negative for dyspnea Cardiovascular: Negative for chest pain, palpitations Gastrointestinal: Negative for heartburn, nausea, vomiting, abdominal pain. Musculoskeletal: Negative for back pain, falls in facility Skin: Negative for itching, rash.  Neurological: Negative for dizziness and tingling Psychiatric/Behavioral: Negative for depression   Past Medical History  Diagnosis Date  . HTN (hypertension)   . GERD (gastroesophageal reflux disease)   . Hyperglycemia   . Allergic rhinitis   . Chronic cough   . Compression fracture   . Diverticulosis   . Hemorrhoids   . Osteoporosis     fosamax in past/ followed by Dr Lorin Mercy   . Hematuria   . Family history of adverse reaction to anesthesia     "my brother broke femur in 2014; he never really came out of anesthesia; was ok before the OR"  . Endometrial cancer   . Uterine cancer     S/P hysterectomy  . Breast cancer, left breast 12/99    S/P mastectomy  . Basal cell carcinoma  of buttock   . Diabetes mellitus     borderline  . Arthritis     "right thumb; some in my back" (05/01/2015)  . Urinary incontinence    Past Surgical History  Procedure Laterality Date  . Total abdominal hysterectomy w/ bilateral salpingoophorectomy      endometrial cancer  . Mastectomy Left   . Basal cell carcinoma excision  10/08    "my rear end"  . Cataract extraction w/ intraocular lens  implant, bilateral  ~ 2010-2012  . Dexa  2/00    LS osteoporsis  . Dexa  2/02    same; OP, LS, spine, hip  . Dexa  3/04    BMD spine; low BMD hip  . Colonoscopy  3/02    Diverticulosis; hemorrhoids  . Dilation and curettage of uterus    . Shoulder surgery Right     "fixed a muscle; not rotator cuff"  . Carpal tunnel release Right   . Laparoscopic cholecystectomy  2009  . Total abdominal hysterectomy      TAH BSO  . Back surgery  2007    Dr. Lorin Mercy -disk (pt denies this hx on 05/01/2015)  . Orif ankle fracture Right 05/02/2015    Procedure: OPEN REDUCTION INTERNAL FIXATION (ORIF) ANKLE FRACTURE/BIMALL;  Surgeon: Leandrew Koyanagi, MD;  Location: [place];  Service: Orthopedics;  Laterality: Right;    Physical Exam: Filed Vitals:   06/03/15 1455  BP:  159/68  Pulse: 90  Temp: 98.2 F (36.8 C)  Resp: 18  SpO2: 95%    General- elderly female, in no acute distress Head- normocephalic, atraumatic Neck- no cervical lymphadenopathy Cardiovascular- normal s1,s2, no murmurs Musculoskeletal- able to move all 4 extremities, right leg surgical incision above medial malleolus has peri-incision erythema and tenderness, mild warmth noted, mild dorsal erythema noted with skin intact, able to wiggle her toes, good capillary refill noted, 1+ pitting edema to right foot and lower leg Neurological- no focal deficit, alert and oriented  Psychiatry- normal mood and affect    Labs reviewed: 05/23/15 wbc 6.7, hb 12.4, hct 39.5, plt 390  Assessment/plan  Right leg cellulitis Start keflex 500 mg q6 hour x 1  week with florastor 250 mg bid and get cbc with diff, cmp in am to assess further. To keep leg elevated at rest. Advised on padding above the erythematous area with gauze before applying the boot. Tylenol prn for pain and reassess if no improvement. Will need xray and orthopedic consult if no improvement. Check vitals q shift x 3 days  Right leg edema With ongoing cellulitis, avoid ted hose. Start lasix 20 mg po daily for a week and keep leg elevated at rest.   Blanchie Serve, MD  [facility] [phone] (Monday-Friday 8 am - 5 pm) [phone] (afterhours)

## 2015-06-04 LAB — HEPATIC FUNCTION PANEL
ALT: 11 U/L (ref 7–35)
AST: 14 U/L (ref 13–35)
Alkaline Phosphatase: 74 U/L (ref 25–125)
BILIRUBIN, TOTAL: 0.4 mg/dL

## 2015-06-04 LAB — CBC AND DIFFERENTIAL
HEMATOCRIT: 40 % (ref 36–46)
HEMOGLOBIN: 12.5 g/dL (ref 12.0–16.0)
Platelets: 308 10*3/uL (ref 150–399)
WBC: 7 10*3/mL

## 2015-06-04 LAB — BASIC METABOLIC PANEL
BUN: 16 mg/dL (ref 4–21)
Creatinine: 0.9 mg/dL (ref 0.5–1.1)
Glucose: 101 mg/dL
Potassium: 4.1 mmol/L (ref 3.4–5.3)
Sodium: 139 mmol/L (ref 137–147)

## 2015-06-05 ENCOUNTER — Non-Acute Institutional Stay (SKILLED_NURSING_FACILITY): Payer: Medicare Other | Admitting: Internal Medicine

## 2015-06-05 ENCOUNTER — Encounter: Payer: Self-pay | Admitting: Internal Medicine

## 2015-06-05 DIAGNOSIS — R600 Localized edema: Secondary | ICD-10-CM

## 2015-06-05 DIAGNOSIS — L03115 Cellulitis of right lower limb: Secondary | ICD-10-CM

## 2015-06-05 NOTE — Progress Notes (Signed)
Patient ID: [name], female   DOB: 11-09-1926, 81 y.o.   MRN: [id number]   [facility]  Code Status: Full Code  Chief Complaint  Patient presents with  . Acute Visit    Follow-up on cellulitis and medications     Allergies  Allergen Reactions  . Amoxicillin-Pot Clavulanate Nausea Only  . Clarithromycin Nausea Only  . Codeine Nausea Only  . Sulfonamide Derivatives Nausea Only  . Tetracycline Nausea Only    HPI:  81 y.o. patient is seen for follow up visit. Her erythema and swelling in right foot and leg has subsided and improved. She denies any worsening of her pain. acute concern. She is here for short term rehabilitation post hospital admission with bimalleolar fracture of right ankle s/p ORIF and is going home tomorrow.   Review of Systems:  Constitutional: Negative for fever, chills, diaphoresis.  Respiratory: negative for dyspnea Cardiovascular: Negative for chest pain, palpitations Gastrointestinal: Negative for heartburn, nausea, vomiting, abdominal pain. Neurological: Negative for dizziness and tingling  Past Medical History  Diagnosis Date  . HTN (hypertension)   . GERD (gastroesophageal reflux disease)   . Hyperglycemia   . Allergic rhinitis   . Chronic cough   . Compression fracture   . Diverticulosis   . Hemorrhoids   . Osteoporosis     fosamax in past/ followed by Dr Lorin Mercy   . Hematuria   . Family history of adverse reaction to anesthesia     "my brother broke femur in 2014; he never really came out of anesthesia; was ok before the OR"  . Endometrial cancer   . Uterine cancer     S/P hysterectomy  . Breast cancer, left breast 12/99    S/P mastectomy  . Basal cell carcinoma of buttock   . Diabetes mellitus     borderline  . Arthritis     "right thumb; some in my back" (05/01/2015)  . Urinary incontinence        Medication List       This list is accurate as of: 06/05/15 11:32 AM.  Always use your most recent med list.                 acetaminophen 650 MG CR tablet  Commonly known as:  TYLENOL  Take 1,300 mg by mouth 2 (two) times daily.     CALCIUM PO  Take 500 mg by mouth 3 (three) times daily.     CARTIA XT 240 MG 24 hr capsule  Generic drug:  diltiazem  TAKE 1 CAPSULE EVERY DAY     cephALEXin 500 MG capsule  Commonly known as:  KEFLEX  Take 500 mg by mouth every 6 (six) hours. X 1 week as of 06/03/15     chlorpheniramine 4 MG tablet  Commonly known as:  CHLOR-TRIMETON  Take 4 mg by mouth 2 (two) times daily as needed for allergies.     docusate sodium 100 MG capsule  Commonly known as:  COLACE  Take 100 mg by mouth daily as needed for mild constipation.     FLORASTOR 250 MG capsule  Generic drug:  saccharomyces boulardii  Take 250 mg by mouth 2 (two) times daily. X 1 week as of 06/03/2015     furosemide 20 MG tablet  Commonly known as:  LASIX  Take 20 mg by mouth daily. X 1 week as of 06/03/15     multivitamin with minerals Tabs tablet  Take 1 tablet by mouth  daily.     omeprazole 40 MG capsule  Commonly known as:  PRILOSEC  Take one capsule by mouth twice daily for stomach     ondansetron 4 MG tablet  Commonly known as:  ZOFRAN  Take 4 mg by mouth every 8 (eight) hours as needed for nausea or vomiting.     rOPINIRole 1 MG tablet  Commonly known as:  REQUIP  Take one tablet by mouth once daily for restless legs     senna-docusate 8.6-50 MG per tablet  Commonly known as:  Senokot-S  Take 2 tablets by mouth 2 (two) times daily. For constipation     spironolactone 25 MG tablet  Commonly known as:  ALDACTONE  Take one tablet by mouth once daily for hypertension     traMADol 50 MG tablet  Commonly known as:  ULTRAM  Take 1 tablet (50 mg total) by mouth every 6 (six) hours as needed for severe pain.     VITAMIN D PO  Take 800 Units by mouth daily.        Physical exam BP 150/77 mmHg  Pulse 87  Temp(Src) 97.8 F (36.6 C) (Oral)  Resp 20  Ht 5' (1.524 m)  Wt 166  lb 6.4 oz (75.479 kg)  BMI 32.50 kg/m2  SpO2 96%  LMP 07/08/1989  General- elderly female, in no acute distress Head- normocephalic, atraumatic Neck- no cervical lymphadenopathy Cardiovascular- normal s1,s2, no murmurs Musculoskeletal- able to move all 4 extremities, right leg surgical incision above medial malleolus has peri-incision erythema improved from last visit. Foot erythema also improved. Trace edema in leg and foot.  Neurological- no focal deficit, alert and oriented  Psychiatry- normal mood and affect   Labs reviewed: 05/23/15 wbc 6.7, hb 12.4, hct 39.5, plt 390  Lab Results  Component Value Date   WBC 7.0 06/04/2015   HGB 12.5 06/04/2015   HCT 40 06/04/2015   MCV 91.5 05/06/2015   PLT 308 06/04/2015   CMP     Component Value Date/Time   NA 139 06/04/2015   NA 132* 05/05/2015 0427   K 4.1 06/04/2015   CL 93* 05/05/2015 0427   CO2 28 05/05/2015 0427   GLUCOSE 116* 05/05/2015 0427   BUN 16 06/04/2015   BUN 31* 05/05/2015 0427   CREATININE 0.9 06/04/2015   CREATININE 0.91 05/05/2015 0427   CALCIUM 8.0* 05/05/2015 0427   PROT 7.9 05/15/2014 0759   ALBUMIN 4.1 05/15/2014 0759   AST 14 06/04/2015   ALT 11 06/04/2015   ALKPHOS 74 06/04/2015   BILITOT 0.6 05/15/2014 0759   GFRNONAA 56* 05/05/2015 0427   GFRAA >60 05/05/2015 0427     Assessment/plan  Right leg cellulitis Improving, afebrile, wbc normal. Continue keflex 500 mg q6 hour for additional 5 days with folrastor. Script provided. Nursing to mark the erythematous area. Will need RN to evaluate pt at home for improvement of cellulitis. Social worker to arrange for RN service at home prior to discharge. Continue to keep leg elevated at rest and padding above the erythematous area with gauze before applying the boot. Tylenol prn for pain.  Right leg edema improved with lasix. Continue lasix 20 mg po daily for 5 more days. Script for lasix provided   Blanchie Serve, MD  [facility] Adult  Medicine [phone] (Monday-Friday 8 am - 5 pm) [phone] (afterhours)

## 2015-06-10 ENCOUNTER — Ambulatory Visit: Payer: Medicare Other | Admitting: Certified Nurse Midwife

## 2015-06-11 ENCOUNTER — Ambulatory Visit (INDEPENDENT_AMBULATORY_CARE_PROVIDER_SITE_OTHER): Payer: Medicare Other | Admitting: Family Medicine

## 2015-06-11 ENCOUNTER — Encounter: Payer: Self-pay | Admitting: Family Medicine

## 2015-06-11 VITALS — BP 124/62 | HR 94 | Temp 98.6°F | Ht 60.25 in

## 2015-06-11 DIAGNOSIS — L03115 Cellulitis of right lower limb: Secondary | ICD-10-CM | POA: Insufficient documentation

## 2015-06-11 DIAGNOSIS — Y92009 Unspecified place in unspecified non-institutional (private) residence as the place of occurrence of the external cause: Secondary | ICD-10-CM

## 2015-06-11 DIAGNOSIS — Z23 Encounter for immunization: Secondary | ICD-10-CM | POA: Diagnosis not present

## 2015-06-11 DIAGNOSIS — W19XXXS Unspecified fall, sequela: Secondary | ICD-10-CM

## 2015-06-11 DIAGNOSIS — S82891D Other fracture of right lower leg, subsequent encounter for closed fracture with routine healing: Secondary | ICD-10-CM | POA: Diagnosis not present

## 2015-06-11 NOTE — Progress Notes (Signed)
Pre visit review using our clinic review tool, if applicable. No additional management support is needed unless otherwise documented below in the visit note. 

## 2015-06-11 NOTE — Progress Notes (Signed)
Subjective:    Patient ID: [name], female    DOB: 02/07/1927, 81 y.o.   MRN: [id number]  HPI Here for f/u of hosp and rehab stay Fractured R ankle and had ORIF (bi malleolar) and also fx great toe complic by cellulitis with swelling  hosp 8/17-8/23 Rehab at [facility] and home on Friday   Pt states her fall occurred at night when walking through kitchen into hall - there was a trans to carpet and her foot caught with her bedroom shoe  They had to break open the door to get in and she had to get to the phone  Did after this get rid of the carpet   SW arranged for home nursing - to watch foot  Sent out with 5 more days of keflex and florastar   Also 5 more days of lasix to help with swelling    Swelling is dependent - gets better with elevation and bed     Chemistry      Component Value Date/Time   NA 139 06/04/2015   NA 132* 05/05/2015 0427   K 4.1 06/04/2015   CL 93* 05/05/2015 0427   CO2 28 05/05/2015 0427   BUN 16 06/04/2015   BUN 31* 05/05/2015 0427   CREATININE 0.9 06/04/2015   CREATININE 0.91 05/05/2015 0427   GLU 101 06/04/2015      Component Value Date/Time   CALCIUM 8.0* 05/05/2015 0427   ALKPHOS 74 06/04/2015   AST 14 06/04/2015   ALT 11 06/04/2015   BILITOT 0.6 05/15/2014 0759     Lab Results  Component Value Date   WBC 7.0 06/04/2015   HGB 12.5 06/04/2015   HCT 40 06/04/2015   MCV 91.5 05/06/2015   PLT 308 06/04/2015     Right now biggest struggle is the boot -uncomfortable  Has a wheelchair most of the time Has a walker - difficult to use with carpet - hard to hop around  Did PT for walker use - it was hard but she did it   Sees ortho on Monday - unsure what to expect - Sharp change to a smaller boot   Trying to keep a pos attitude  Not depressed  Just frustrated ! Wants to clean her house   Niece comes to stay with her at night Sisters and niece come through all day   Patient Active Problem List   Diagnosis Date Noted  .  Cellulitis of right foot 06/11/2015  . Bimalleolar fracture of right ankle 04/30/2015  . Closed right ankle fracture 04/30/2015  . Shingles 12/04/2013  . Gastroenteritis 10/31/2013  . Encounter for Medicare annual wellness exam 01/31/2013  . Colon cancer screening 01/31/2013  . Constipation 09/28/2012  . Dyspnea 01/17/2012  . Anemia 01/05/2012  . Chronic cough 12/27/2011  . 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 Pedal edema 12/22/2011  . B12 deficiency 12/22/2011  . Chest pain 11/21/2011  . Obesity 11/10/2011  . Hyperglycemia 05/11/2010  . Pure hypercholesterolemia 11/03/2009  . BACK PAIN 10/14/2009  . MICROSCOPIC HEMATURIA 12/06/2008  . URINARY INCONTINENCE, MIXED 12/06/2008  . ALLERGIC RHINITIS 04/22/2008  . OSTEOPOROSIS 04/22/2008  . CHOLELITHIASIS 09/06/2007  . RESTLESS LEG SYNDROME 05/05/2007  . KERATOSIS, [place] NEC 05/05/2007  . Osteoarthritis 12/15/2006  . BREAST CANCER, HX OF 12/11/2006  . Essential hypertension 11/29/2006  . GERD 11/29/2006   Past Medical History  Diagnosis Date  . HTN (hypertension)   . GERD (gastroesophageal reflux disease)   . Hyperglycemia   .  Allergic rhinitis   . Chronic cough   . Compression fracture   . Diverticulosis   . Hemorrhoids   . Osteoporosis     fosamax in past/ followed by Dr Lorin Mercy   . Hematuria   . Family history of adverse reaction to anesthesia     "my brother broke femur in 2014; he never really came out of anesthesia; was ok before the OR"  . Endometrial cancer   . Uterine cancer     S/P hysterectomy  . Breast cancer, left breast 12/99    S/P mastectomy  . Basal cell carcinoma of buttock   . Diabetes mellitus     borderline  . Arthritis     "right thumb; some in my back" (05/01/2015)  . Urinary incontinence    Past Surgical History  Procedure Laterality Date  . Total abdominal hysterectomy w/ bilateral salpingoophorectomy      endometrial cancer  . Mastectomy Left   . Basal cell carcinoma excision  10/08     "my rear end"  . Cataract extraction w/ intraocular lens  implant, bilateral  ~ 2010-2012  . Dexa  2/00    LS osteoporsis  . Dexa  2/02    same; OP, LS, spine, hip  . Dexa  3/04    BMD spine; low BMD hip  . Colonoscopy  3/02    Diverticulosis; hemorrhoids  . Dilation and curettage of uterus    . Shoulder surgery Right     "fixed a muscle; not rotator cuff"  . Carpal tunnel release Right   . Laparoscopic cholecystectomy  2009  . Total abdominal hysterectomy      TAH BSO  . Back surgery  2007    Dr. Lorin Mercy -disk (pt denies this hx on 05/01/2015)  . Orif ankle fracture Right 05/02/2015    Procedure: OPEN REDUCTION INTERNAL FIXATION (ORIF) ANKLE FRACTURE/BIMALL;  Surgeon: Leandrew Koyanagi, MD;  Location: [facility];  Service: Orthopedics;  Laterality: Right;   Social History  Substance Use Topics  . Smoking status: Never Smoker   . Smokeless tobacco: Never Used  . Alcohol Use: No   Family History  Problem Relation Age of Onset  . Colon cancer Brother   . Breast cancer Sister   . Breast cancer Other    Allergies  Allergen Reactions  . Amoxicillin-Pot Clavulanate Nausea Only  . Clarithromycin Nausea Only  . Codeine Nausea Only  . Sulfonamide Derivatives Nausea Only  . Tetracycline Nausea Only   Current Outpatient Prescriptions on File Prior to Visit  Medication Sig Dispense Refill  . acetaminophen (TYLENOL) 650 MG CR tablet Take 1,300 mg by mouth 2 (two) times daily.      Marland Kitchen CALCIUM PO Take 500 mg by mouth 3 (three) times daily.     Marland Kitchen CARTIA XT 240 MG 24 hr capsule TAKE 1 CAPSULE EVERY DAY 90 capsule 3  . chlorpheniramine (CHLOR-TRIMETON) 4 MG tablet Take 4 mg by mouth 2 (two) times daily as needed for allergies.    . Cholecalciferol (VITAMIN D PO) Take 800 Units by mouth daily.     Marland Kitchen docusate sodium (COLACE) 100 MG capsule Take 100 mg by mouth daily as needed for mild constipation.     . Multiple Vitamin (MULITIVITAMIN WITH MINERALS) TABS Take 1 tablet by mouth daily.    Marland Kitchen  omeprazole (PRILOSEC) 40 MG capsule Take one capsule by mouth twice daily for stomach    . ondansetron (ZOFRAN) 4 MG tablet Take 4 mg by mouth  every 8 (eight) hours as needed for nausea or vomiting.    Marland Kitchen rOPINIRole (REQUIP) 1 MG tablet Take one tablet by mouth once daily for restless legs    . senna-docusate (SENOKOT-S) 8.6-50 MG per tablet Take 2 tablets by mouth 2 (two) times daily. For constipation    . spironolactone (ALDACTONE) 25 MG tablet Take one tablet by mouth once daily for hypertension    . traMADol (ULTRAM) 50 MG tablet Take 1 tablet (50 mg total) by mouth every 6 (six) hours as needed for severe pain. 120 tablet 0   No current facility-administered medications on file prior to visit.    Review of Systems Review of Systems  Constitutional: Negative for fever, appetite change, fatigue and unexpected weight change.  Eyes: Negative for pain and visual disturbance.  Respiratory: Negative for cough and shortness of breath.   Cardiovascular: Negative for cp or palpitations    Gastrointestinal: Negative for nausea, diarrhea and constipation.  Genitourinary: Negative for urgency and frequency.  Skin: Negative for pallor or rash  pos for cellulitis of R foot at incision site  MSK pos for R ankle pain after fx and surgery that is improving  Neurological: Negative for weakness, light-headedness, numbness and headaches.  Hematological: Negative for adenopathy. Does not bruise/bleed easily.  Psychiatric/Behavioral: Negative for dysphoric mood. The patient is not nervous/anxious.         Objective:   Physical Exam  Constitutional: She appears well-developed and well-nourished. No distress.  overwt and well appearing in wheelchair   HENT:  Head: Normocephalic and atraumatic.  Mouth/Throat: Oropharynx is clear and moist.  Eyes: Conjunctivae and EOM are normal. Pupils are equal, round, and reactive to light.  Neck: Normal range of motion. Neck supple. No JVD present. Carotid bruit is not  present. No thyromegaly present.  Cardiovascular: Normal rate, regular rhythm, normal heart sounds and intact distal pulses.  Exam reveals no gallop.   Pulmonary/Chest: Effort normal and breath sounds normal. No respiratory distress. She has no wheezes. She has no rales.  No crackles  Abdominal: Soft. Bowel sounds are normal. She exhibits no distension, no abdominal bruit and no mass. There is no tenderness.  Musculoskeletal: She exhibits edema and tenderness.  R foot and ankle in boot (non wt bearing status in wheelchair) Healing incisions noted - with some erythema around foot incision - however regressed from line drawn by nursing Minimally tender and no drainage  Mild swelling of ankle   Lymphadenopathy:    She has no cervical adenopathy.  Neurological: She is alert. She has normal reflexes.  Skin: Skin is warm and dry. No rash noted.  Psychiatric: She has a normal mood and affect.          Assessment & Plan:   Problem List Items Addressed This Visit      Musculoskeletal and Integument   Closed right ankle fracture    Pt is still non weight bearing in a boot - wheelchair and walker (after orif) Happy to be home from rehab For f/u with ortho soon - with hopes she can start partial weight bearing         Other   Cellulitis of right foot    S/p ORIF for ankle fracture Rev hosp and nursing home records/studies and labs today with pt  Overall improving - redness around incision is improved (based on line on skin drawn by nursing) Pt will finish keflex and florastar  Continue to elevate leg  Finish lasix for swelling and observe  Home nursing continues and will update if any worsening of condition F/u with ortho as planned  Other medical problems are stable       Fall at home    Disc fall prev in detail  The carpet in home has been removed (she tripped on area between carpet and hard floor) Will stop using bedroom shoes and wear more dependable footwear For now - non  weight bearing -will be able to assess balance better when she can walk and can do more PT         Other Visit Diagnoses    Need for influenza vaccination    -  Primary    Relevant Orders    Flu Vaccine QUAD 36+ mos PF IM (Fluarix & Fluzone Quad PF) (Completed)

## 2015-06-11 NOTE — Patient Instructions (Signed)
Please try to eliminate any trip hazards in your house  Wear your cell phone around your neck or get a medic alert button so you have access to care at all times Follow up with orthopedics as planned  Finish antibiotics and lasix    Flu shot today

## 2015-06-12 DIAGNOSIS — Y92009 Unspecified place in unspecified non-institutional (private) residence as the place of occurrence of the external cause: Secondary | ICD-10-CM | POA: Insufficient documentation

## 2015-06-12 DIAGNOSIS — W19XXXA Unspecified fall, initial encounter: Secondary | ICD-10-CM | POA: Insufficient documentation

## 2015-06-12 NOTE — Assessment & Plan Note (Signed)
Disc fall prev in detail  The carpet in home has been removed (she tripped on area between carpet and hard floor) Will stop using bedroom shoes and wear more dependable footwear For now - non weight bearing -will be able to assess balance better when she can walk and can do more PT

## 2015-06-12 NOTE — Assessment & Plan Note (Signed)
Pt is still non weight bearing in a boot - wheelchair and walker (after orif) Happy to be home from rehab For f/u with ortho soon - with hopes she can start partial weight bearing

## 2015-06-12 NOTE — Assessment & Plan Note (Signed)
S/p ORIF for ankle fracture Rev hosp and nursing home records/studies and labs today with pt  Overall improving - redness around incision is improved (based on line on skin drawn by nursing) Pt will finish keflex and florastar  Continue to elevate leg  Finish lasix for swelling and observe  Home nursing continues and will update if any worsening of condition F/u with ortho as planned  Other medical problems are stable

## 2015-06-16 ENCOUNTER — Telehealth: Payer: Self-pay

## 2015-06-16 NOTE — Telephone Encounter (Signed)
Verbal order given to Connie 

## 2015-06-16 NOTE — Telephone Encounter (Signed)
Marlowe Kays OT with [facility] left v/m requesting verbal order for home health OT 1 x a week for 3 weeks.

## 2015-06-16 NOTE — Telephone Encounter (Signed)
Please ok that order verbally, thanks

## 2015-06-24 ENCOUNTER — Telehealth: Payer: Self-pay | Admitting: Family Medicine

## 2015-06-24 NOTE — Telephone Encounter (Signed)
Amanda @ [facility] called she needs orders for skilled nursing 1x week for 6 weeks

## 2015-06-24 NOTE — Telephone Encounter (Signed)
Verbal order given to Amanda 

## 2015-06-24 NOTE — Telephone Encounter (Signed)
Please verbally ok that order  

## 2015-06-25 ENCOUNTER — Inpatient Hospital Stay (HOSPITAL_COMMUNITY)
Admission: EM | Admit: 2015-06-25 | Discharge: 2015-07-15 | DRG: 871 | Disposition: E | Payer: Medicare Other | Attending: Pulmonary Disease | Admitting: Pulmonary Disease

## 2015-06-25 ENCOUNTER — Emergency Department (HOSPITAL_COMMUNITY): Payer: Medicare Other

## 2015-06-25 ENCOUNTER — Encounter (HOSPITAL_COMMUNITY): Payer: Self-pay

## 2015-06-25 DIAGNOSIS — Z853 Personal history of malignant neoplasm of breast: Secondary | ICD-10-CM | POA: Diagnosis not present

## 2015-06-25 DIAGNOSIS — Z961 Presence of intraocular lens: Secondary | ICD-10-CM | POA: Diagnosis present

## 2015-06-25 DIAGNOSIS — K219 Gastro-esophageal reflux disease without esophagitis: Secondary | ICD-10-CM | POA: Diagnosis present

## 2015-06-25 DIAGNOSIS — Z9049 Acquired absence of other specified parts of digestive tract: Secondary | ICD-10-CM

## 2015-06-25 DIAGNOSIS — I517 Cardiomegaly: Secondary | ICD-10-CM | POA: Diagnosis present

## 2015-06-25 DIAGNOSIS — A047 Enterocolitis due to Clostridium difficile: Secondary | ICD-10-CM | POA: Diagnosis present

## 2015-06-25 DIAGNOSIS — J9811 Atelectasis: Secondary | ICD-10-CM | POA: Diagnosis present

## 2015-06-25 DIAGNOSIS — K529 Noninfective gastroenteritis and colitis, unspecified: Secondary | ICD-10-CM | POA: Diagnosis not present

## 2015-06-25 DIAGNOSIS — Z85828 Personal history of other malignant neoplasm of skin: Secondary | ICD-10-CM | POA: Diagnosis not present

## 2015-06-25 DIAGNOSIS — M810 Age-related osteoporosis without current pathological fracture: Secondary | ICD-10-CM | POA: Diagnosis present

## 2015-06-25 DIAGNOSIS — E872 Acidosis: Secondary | ICD-10-CM | POA: Diagnosis present

## 2015-06-25 DIAGNOSIS — T508X5A Adverse effect of diagnostic agents, initial encounter: Secondary | ICD-10-CM | POA: Diagnosis not present

## 2015-06-25 DIAGNOSIS — R6521 Severe sepsis with septic shock: Secondary | ICD-10-CM | POA: Diagnosis not present

## 2015-06-25 DIAGNOSIS — J9601 Acute respiratory failure with hypoxia: Secondary | ICD-10-CM | POA: Diagnosis not present

## 2015-06-25 DIAGNOSIS — Z66 Do not resuscitate: Secondary | ICD-10-CM | POA: Diagnosis present

## 2015-06-25 DIAGNOSIS — K5669 Other intestinal obstruction: Secondary | ICD-10-CM | POA: Diagnosis present

## 2015-06-25 DIAGNOSIS — Z90710 Acquired absence of both cervix and uterus: Secondary | ICD-10-CM | POA: Diagnosis not present

## 2015-06-25 DIAGNOSIS — A0472 Enterocolitis due to Clostridium difficile, not specified as recurrent: Secondary | ICD-10-CM | POA: Diagnosis present

## 2015-06-25 DIAGNOSIS — N179 Acute kidney failure, unspecified: Secondary | ICD-10-CM | POA: Insufficient documentation

## 2015-06-25 DIAGNOSIS — A419 Sepsis, unspecified organism: Principal | ICD-10-CM | POA: Diagnosis present

## 2015-06-25 DIAGNOSIS — R52 Pain, unspecified: Secondary | ICD-10-CM

## 2015-06-25 DIAGNOSIS — D649 Anemia, unspecified: Secondary | ICD-10-CM | POA: Diagnosis present

## 2015-06-25 DIAGNOSIS — N141 Nephropathy induced by other drugs, medicaments and biological substances: Secondary | ICD-10-CM | POA: Diagnosis present

## 2015-06-25 DIAGNOSIS — Z808 Family history of malignant neoplasm of other organs or systems: Secondary | ICD-10-CM | POA: Diagnosis not present

## 2015-06-25 DIAGNOSIS — K567 Ileus, unspecified: Secondary | ICD-10-CM | POA: Diagnosis present

## 2015-06-25 DIAGNOSIS — R197 Diarrhea, unspecified: Secondary | ICD-10-CM | POA: Diagnosis present

## 2015-06-25 DIAGNOSIS — Z803 Family history of malignant neoplasm of breast: Secondary | ICD-10-CM | POA: Diagnosis not present

## 2015-06-25 DIAGNOSIS — Z8542 Personal history of malignant neoplasm of other parts of uterus: Secondary | ICD-10-CM | POA: Diagnosis not present

## 2015-06-25 DIAGNOSIS — Z8049 Family history of malignant neoplasm of other genital organs: Secondary | ICD-10-CM | POA: Diagnosis not present

## 2015-06-25 DIAGNOSIS — G9340 Encephalopathy, unspecified: Secondary | ICD-10-CM | POA: Diagnosis not present

## 2015-06-25 DIAGNOSIS — M1990 Unspecified osteoarthritis, unspecified site: Secondary | ICD-10-CM

## 2015-06-25 DIAGNOSIS — E860 Dehydration: Secondary | ICD-10-CM | POA: Diagnosis present

## 2015-06-25 DIAGNOSIS — Z79899 Other long term (current) drug therapy: Secondary | ICD-10-CM

## 2015-06-25 DIAGNOSIS — I10 Essential (primary) hypertension: Secondary | ICD-10-CM | POA: Diagnosis present

## 2015-06-25 DIAGNOSIS — E1165 Type 2 diabetes mellitus with hyperglycemia: Secondary | ICD-10-CM | POA: Diagnosis present

## 2015-06-25 DIAGNOSIS — I5032 Chronic diastolic (congestive) heart failure: Secondary | ICD-10-CM

## 2015-06-25 DIAGNOSIS — D72829 Elevated white blood cell count, unspecified: Secondary | ICD-10-CM | POA: Diagnosis present

## 2015-06-25 DIAGNOSIS — Z9010 Acquired absence of unspecified breast and nipple: Secondary | ICD-10-CM

## 2015-06-25 DIAGNOSIS — S8251XD Displaced fracture of medial malleolus of right tibia, subsequent encounter for closed fracture with routine healing: Secondary | ICD-10-CM | POA: Diagnosis not present

## 2015-06-25 DIAGNOSIS — R0602 Shortness of breath: Secondary | ICD-10-CM

## 2015-06-25 DIAGNOSIS — Z800 Family history of malignant neoplasm of digestive organs: Secondary | ICD-10-CM | POA: Diagnosis not present

## 2015-06-25 DIAGNOSIS — S82431D Displaced oblique fracture of shaft of right fibula, subsequent encounter for closed fracture with routine healing: Secondary | ICD-10-CM

## 2015-06-25 DIAGNOSIS — Z978 Presence of other specified devices: Secondary | ICD-10-CM

## 2015-06-25 LAB — COMPREHENSIVE METABOLIC PANEL
ALT: 22 U/L (ref 14–54)
ALT: 21 U/L (ref 14–54)
ANION GAP: 9 (ref 5–15)
AST: 28 U/L (ref 15–41)
AST: 27 U/L (ref 15–41)
Albumin: 3.6 g/dL (ref 3.5–5.0)
Albumin: 3.1 g/dL — ABNORMAL LOW (ref 3.5–5.0)
Alkaline Phosphatase: 82 U/L (ref 38–126)
Alkaline Phosphatase: 75 U/L (ref 38–126)
Anion gap: 10 (ref 5–15)
BUN: 14 mg/dL (ref 6–20)
BUN: 13 mg/dL (ref 6–20)
CHLORIDE: 100 mmol/L — AB (ref 101–111)
CHLORIDE: 102 mmol/L (ref 101–111)
CO2: 24 mmol/L (ref 22–32)
CO2: 22 mmol/L (ref 22–32)
CREATININE: 0.84 mg/dL (ref 0.44–1.00)
CREATININE: 0.88 mg/dL (ref 0.44–1.00)
Calcium: 8.8 mg/dL — ABNORMAL LOW (ref 8.9–10.3)
Calcium: 8.2 mg/dL — ABNORMAL LOW (ref 8.9–10.3)
GFR calc non Af Amer: 58 mL/min — ABNORMAL LOW (ref >60)
Glucose, Bld: 139 mg/dL — ABNORMAL HIGH (ref 65–99)
Glucose, Bld: 119 mg/dL — ABNORMAL HIGH (ref 65–99)
POTASSIUM: 3.5 mmol/L (ref 3.5–5.1)
POTASSIUM: 3.8 mmol/L (ref 3.5–5.1)
SODIUM: 134 mmol/L — AB (ref 135–145)
SODIUM: 133 mmol/L — AB (ref 135–145)
Total Bilirubin: 0.7 mg/dL (ref 0.3–1.2)
Total Bilirubin: 1 mg/dL (ref 0.3–1.2)
Total Protein: 7.6 g/dL (ref 6.5–8.1)
Total Protein: 7.1 g/dL (ref 6.5–8.1)

## 2015-06-25 LAB — I-STAT CG4 LACTIC ACID, ED
LACTIC ACID, VENOUS: 1.35 mmol/L (ref 0.5–2.0)
Lactic Acid, Venous: 0.65 mmol/L (ref 0.5–2.0)

## 2015-06-25 LAB — CBC WITH DIFFERENTIAL/PLATELET
BASOS ABS: 0 10*3/uL (ref 0.0–0.1)
Basophils Absolute: 0 10*3/uL (ref 0.0–0.1)
Basophils Relative: 0 %
Basophils Relative: 0 %
EOS ABS: 0 10*3/uL (ref 0.0–0.7)
Eosinophils Absolute: 0 10*3/uL (ref 0.0–0.7)
Eosinophils Relative: 0 %
Eosinophils Relative: 0 %
HCT: 38.2 % (ref 36.0–46.0)
HCT: 35.5 % — ABNORMAL LOW (ref 36.0–46.0)
HEMOGLOBIN: 11.8 g/dL — AB (ref 12.0–15.0)
Hemoglobin: 12.9 g/dL (ref 12.0–15.0)
LYMPHS ABS: 0.9 10*3/uL (ref 0.7–4.0)
LYMPHS PCT: 4 %
LYMPHS PCT: 3 %
Lymphs Abs: 0.6 10*3/uL — ABNORMAL LOW (ref 0.7–4.0)
MCH: 30.7 pg (ref 26.0–34.0)
MCH: 30.3 pg (ref 26.0–34.0)
MCHC: 33.8 g/dL (ref 30.0–36.0)
MCHC: 33.2 g/dL (ref 30.0–36.0)
MCV: 91 fL (ref 78.0–100.0)
MCV: 91 fL (ref 78.0–100.0)
MONOS PCT: 7 %
MONOS PCT: 4 %
Monocytes Absolute: 1.5 10*3/uL — ABNORMAL HIGH (ref 0.1–1.0)
Monocytes Absolute: 0.7 10*3/uL (ref 0.1–1.0)
NEUTROS PCT: 89 %
NEUTROS PCT: 93 %
Neutro Abs: 20.4 10*3/uL — ABNORMAL HIGH (ref 1.7–7.7)
Neutro Abs: 17.3 10*3/uL — ABNORMAL HIGH (ref 1.7–7.7)
PLATELETS: 339 10*3/uL (ref 150–400)
PLATELETS: 317 10*3/uL (ref 150–400)
RBC: 4.2 MIL/uL (ref 3.87–5.11)
RBC: 3.9 MIL/uL (ref 3.87–5.11)
RDW: 14 % (ref 11.5–15.5)
RDW: 14 % (ref 11.5–15.5)
WBC: 22.9 10*3/uL — AB (ref 4.0–10.5)
WBC: 18.6 10*3/uL — AB (ref 4.0–10.5)

## 2015-06-25 LAB — URINALYSIS, ROUTINE W REFLEX MICROSCOPIC
Bilirubin Urine: NEGATIVE
GLUCOSE, UA: NEGATIVE mg/dL
KETONES UR: NEGATIVE mg/dL
Leukocytes, UA: NEGATIVE
Nitrite: NEGATIVE
PH: 5.5 (ref 5.0–8.0)
PROTEIN: NEGATIVE mg/dL
Specific Gravity, Urine: >1.046 — ABNORMAL HIGH (ref 1.005–1.030)
Urobilinogen, UA: 0.2 mg/dL (ref 0.0–1.0)

## 2015-06-25 LAB — TSH: TSH: 1.609 u[IU]/mL (ref 0.350–4.500)

## 2015-06-25 LAB — PROCALCITONIN: Procalcitonin: 4.94 ng/mL

## 2015-06-25 LAB — APTT: APTT: 30 s (ref 24–37)

## 2015-06-25 LAB — PHOSPHORUS: PHOSPHORUS: 3.7 mg/dL (ref 2.5–4.6)

## 2015-06-25 LAB — MAGNESIUM: Magnesium: 1.4 mg/dL — ABNORMAL LOW (ref 1.7–2.4)

## 2015-06-25 LAB — TROPONIN I: Troponin I: <0.03 ng/mL (ref <0.031)

## 2015-06-25 LAB — URINE MICROSCOPIC-ADD ON

## 2015-06-25 LAB — PROTIME-INR
INR: 1.15 (ref 0.00–1.49)
Prothrombin Time: 14.9 seconds (ref 11.6–15.2)

## 2015-06-25 LAB — LACTIC ACID, PLASMA
LACTIC ACID, VENOUS: 0.8 mmol/L (ref 0.5–2.0)
LACTIC ACID, VENOUS: 0.8 mmol/L (ref 0.5–2.0)

## 2015-06-25 MED ORDER — SODIUM CHLORIDE 0.9 % IV SOLN
INTRAVENOUS | Status: AC
Start: 2015-06-25 — End: 2015-06-25

## 2015-06-25 MED ORDER — IOHEXOL 300 MG/ML  SOLN
100.0000 mL | Freq: Once | INTRAMUSCULAR | Status: AC | PRN
  Administered 2015-06-25: 100 mL via INTRAVENOUS

## 2015-06-25 MED ORDER — VANCOMYCIN HCL IN DEXTROSE 750-5 MG/150ML-% IV SOLN
750.0000 mg | Freq: Two times a day (BID) | INTRAVENOUS | Status: DC
  Administered 2015-06-26: 750 mg via INTRAVENOUS
  Filled 2015-06-25: qty 150

## 2015-06-25 MED ORDER — METRONIDAZOLE IN NACL 5-0.79 MG/ML-% IV SOLN
500.0000 mg | Freq: Three times a day (TID) | INTRAVENOUS | Status: DC
  Administered 2015-06-25 – 2015-06-28 (×9): 500 mg via INTRAVENOUS
  Filled 2015-06-25 (×9): qty 100

## 2015-06-25 MED ORDER — ACETAMINOPHEN ER 650 MG PO TBCR: 1300.0000 mg | EXTENDED_RELEASE_TABLET | Freq: Two times a day (BID) | ORAL | Status: DC

## 2015-06-25 MED ORDER — TRAMADOL HCL 50 MG PO TABS
50.0000 mg | ORAL_TABLET | Freq: Four times a day (QID) | ORAL | Status: DC | PRN
  Administered 2015-06-25: 50 mg via ORAL
  Filled 2015-06-25: qty 1

## 2015-06-25 MED ORDER — ONDANSETRON HCL 4 MG PO TABS: 4.0000 mg | ORAL_TABLET | Freq: Three times a day (TID) | ORAL | Status: DC | PRN

## 2015-06-25 MED ORDER — ONDANSETRON HCL 4 MG/2ML IJ SOLN
4.0000 mg | Freq: Four times a day (QID) | INTRAMUSCULAR | Status: DC | PRN
  Administered 2015-06-27 – 2015-06-28 (×4): 4 mg via INTRAVENOUS
  Filled 2015-06-25 (×4): qty 2

## 2015-06-25 MED ORDER — SODIUM CHLORIDE 0.9 % IV BOLUS (SEPSIS)
1000.0000 mL | INTRAVENOUS | Status: AC
  Administered 2015-06-25: 1000 mL via INTRAVENOUS

## 2015-06-25 MED ORDER — DILTIAZEM HCL ER COATED BEADS 120 MG PO CP24
240.0000 mg | ORAL_CAPSULE | Freq: Every day | ORAL | Status: DC
Start: 2015-06-25 — End: 2015-06-28
  Administered 2015-06-25 – 2015-06-28 (×4): 240 mg via ORAL
  Filled 2015-06-25 (×4): qty 1

## 2015-06-25 MED ORDER — CHOLECALCIFEROL 10 MCG (400 UNIT) PO TABS
800.0000 [IU] | ORAL_TABLET | Freq: Every day | ORAL | Status: DC
  Administered 2015-06-25 – 2015-06-28 (×4): 800 [IU] via ORAL
  Filled 2015-06-25 (×5): qty 2

## 2015-06-25 MED ORDER — SPIRONOLACTONE 25 MG PO TABS
25.0000 mg | ORAL_TABLET | Freq: Every day | ORAL | Status: DC
  Administered 2015-06-25 – 2015-06-28 (×4): 25 mg via ORAL
  Filled 2015-06-25 (×4): qty 1

## 2015-06-25 MED ORDER — PIPERACILLIN-TAZOBACTAM 3.375 G IVPB
3.3750 g | Freq: Three times a day (TID) | INTRAVENOUS | Status: DC
  Administered 2015-06-25 – 2015-06-27 (×5): 3.375 g via INTRAVENOUS
  Filled 2015-06-25 (×6): qty 50

## 2015-06-25 MED ORDER — SODIUM CHLORIDE 0.9 % IV BOLUS (SEPSIS): 500.0000 mL | INTRAVENOUS | Status: DC

## 2015-06-25 MED ORDER — SACCHAROMYCES BOULARDII 250 MG PO CAPS
250.0000 mg | ORAL_CAPSULE | Freq: Two times a day (BID) | ORAL | Status: DC
  Administered 2015-06-25 – 2015-06-28 (×7): 250 mg via ORAL
  Filled 2015-06-25 (×8): qty 1

## 2015-06-25 MED ORDER — ACETAMINOPHEN 325 MG PO TABS
650.0000 mg | ORAL_TABLET | Freq: Three times a day (TID) | ORAL | Status: DC
  Administered 2015-06-25 – 2015-06-28 (×12): 650 mg via ORAL
  Filled 2015-06-25 (×16): qty 2

## 2015-06-25 MED ORDER — ONDANSETRON HCL 4 MG PO TABS: 4.0000 mg | ORAL_TABLET | Freq: Four times a day (QID) | ORAL | Status: DC | PRN

## 2015-06-25 MED ORDER — SODIUM CHLORIDE 0.9 % IV SOLN
1500.0000 mg | Freq: Once | INTRAVENOUS | Status: AC
  Administered 2015-06-25: 1500 mg via INTRAVENOUS
  Filled 2015-06-25: qty 1500

## 2015-06-25 MED ORDER — PIPERACILLIN-TAZOBACTAM 3.375 G IVPB 30 MIN
3.3750 g | Freq: Once | INTRAVENOUS | Status: DC
Start: 2015-06-25 — End: 2015-06-27

## 2015-06-25 MED ORDER — PIPERACILLIN-TAZOBACTAM 3.375 G IVPB 30 MIN
3.3750 g | Freq: Once | INTRAVENOUS | Status: AC
  Administered 2015-06-25: 3.375 g via INTRAVENOUS
  Filled 2015-06-25: qty 50

## 2015-06-25 MED ORDER — VANCOMYCIN HCL IN DEXTROSE 1-5 GM/200ML-% IV SOLN: 1000.0000 mg | Freq: Once | INTRAVENOUS | Status: DC

## 2015-06-25 MED ORDER — ACETAMINOPHEN 500 MG PO TABS
1000.0000 mg | ORAL_TABLET | Freq: Once | ORAL | Status: AC
  Administered 2015-06-25: 1000 mg via ORAL
  Filled 2015-06-25: qty 2

## 2015-06-25 MED ORDER — ADULT MULTIVITAMIN W/MINERALS CH
1.0000 | ORAL_TABLET | Freq: Every day | ORAL | Status: DC
  Administered 2015-06-25 – 2015-06-28 (×4): 1 via ORAL
  Filled 2015-06-25 (×4): qty 1

## 2015-06-25 MED ORDER — ROPINIROLE HCL 1 MG PO TABS
1.0000 mg | ORAL_TABLET | Freq: Every day | ORAL | Status: DC
  Administered 2015-06-25 – 2015-06-27 (×3): 1 mg via ORAL
  Filled 2015-06-25 (×6): qty 1

## 2015-06-25 NOTE — ED Notes (Signed)
UA/LACTIC BLOOD RESULTS PENDING

## 2015-06-25 NOTE — ED Notes (Signed)
CODE SEPSIS CALLED

## 2015-06-25 NOTE — Progress Notes (Signed)
ANTIBIOTIC CONSULT NOTE - INITIAL  Pharmacy Consult for Vancomycin and Zosyn Indication: rule out sepsis  Allergies  Allergen Reactions  . Amoxicillin-Pot Clavulanate Nausea Only  . Clarithromycin Nausea Only  . Codeine Nausea Only  . Sulfonamide Derivatives Nausea Only  . Tetracycline Nausea Only    Patient Measurements: Height: 5' (152.4 cm) Weight: 160 lb (72.576 kg) IBW/kg (Calculated) : 45.5  Vital Signs: Temp: 101.5 F (38.6 C) (10/12 0954) Temp Source: Rectal (10/12 0954) BP: 113/43 mmHg (10/12 1200) Pulse Rate: 105 (10/12 1200) Intake/Output from previous day:   Intake/Output from this shift:    Labs:  Recent Labs  06/20/2015 0955  WBC 22.9*  HGB 12.9  PLT 339  CREATININE 0.84   Estimated Creatinine Clearance: 42.7 mL/min (by C-G formula based on Cr of 0.84). No results for input(s): VANCOTROUGH, VANCOPEAK, VANCORANDOM, GENTTROUGH, GENTPEAK, GENTRANDOM, TOBRATROUGH, TOBRAPEAK, TOBRARND, AMIKACINPEAK, AMIKACINTROU, AMIKACIN in the last 72 hours.   Microbiology: No results found for this or any previous visit (from the past 720 hour(s)).  Medical History: Past Medical History  Diagnosis Date  . HTN (hypertension)   . GERD (gastroesophageal reflux disease)   . Hyperglycemia   . Allergic rhinitis   . Chronic cough   . Compression fracture   . Diverticulosis   . Hemorrhoids   . Osteoporosis     fosamax in past/ followed by Dr Lorin Mercy   . Hematuria   . Family history of adverse reaction to anesthesia     "my brother broke femur in 2014; he never really came out of anesthesia; was ok before the OR"  . Endometrial cancer ([place])   . Uterine cancer ([place])     S/P hysterectomy  . Breast cancer, left breast ([place]) 12/99    S/P mastectomy  . Basal cell carcinoma of buttock   . Diabetes mellitus     borderline  . Arthritis     "right thumb; some in my back" (05/01/2015)  . Urinary incontinence     Medications:  Scheduled:  . [START ON 06/26/2015]  vancomycin  750 mg Intravenous Q12H   Infusions:  . piperacillin-tazobactam    . piperacillin-tazobactam (ZOSYN)  IV    . sodium chloride Stopped (06/24/2015 1218)  . vancomycin 1,500 mg (07/02/2015 1112)  . vancomycin     PRN:   Assessment: 81 yo female presents with weakness and diarrhea. She is s/p ORIF ankle fracture 05/02/2015 and was placed on antibiotics ~2 weeks ago for cellulitis around the surgical site.  Code sepsis was called 09:49 and Pharmacy is consulted to dose vancomycin and Zosyn.  Goal of Therapy:  Vancomycin trough level 15-20 mcg/ml  Zosyn dosing appropriate for renal function  Plan:  Vancomycin 1500mg  IV and Zosyn 3.375g IV given in ED ~11:00  Continue with vancomycin 750mg  IV q12h starting 10/13 Check trough at steady state Zosyn 3.375gm IV q8h (4hr extended infusions) Follow up renal function & cultures, clinical course  Peggyann Juba, PharmD, BCPS Pager: [phone] 06/18/2015,12:55 PM

## 2015-06-25 NOTE — ED Notes (Signed)
Bed: EK35 Expected date:  Expected time:  Means of arrival:  Comments: EMS- 81yo F, diarrhea/C DIff?

## 2015-06-25 NOTE — ED Notes (Signed)
COLLECTING LACTIC ACID BLOOD

## 2015-06-25 NOTE — ED Notes (Signed)
Lab delay - pts L arm is restricted.  Pt has an IV in the right hand.  RN will let me know when she can shut off IV so i can get labs.

## 2015-06-25 NOTE — ED Notes (Signed)
LAB AT BS ATTEMPT 2ND SET OF [facility]

## 2015-06-25 NOTE — ED Provider Notes (Signed)
CSN: [id number]     Arrival date & time 06/24/2015  0940 History   First MD Initiated Contact with Patient 07/07/2015 [phone]     Chief Complaint  Patient presents with  . Nausea  . Emesis  . Diarrhea     (Consider location/radiation/quality/duration/timing/severity/associated sxs/prior Treatment) HPI Comments: Patient here with weakness and diarrhea. Diarrhea for the past few days with numerous episodes each day. No blood. She's had one episode of vomiting. She had an ankle surgery and mid August, 2 months ago, after she fell and broke her ankle. She was put on antibiotics 2 weeks ago for skin infection around her surgical site.  Patient is a 81 y.o. female presenting with vomiting and diarrhea. The history is provided by the patient.  Emesis Severity:  Mild Duration:  2 days Timing:  Rare Number of daily episodes:  1 Quality:  Stomach contents Progression:  Unchanged Associated symptoms: diarrhea (numerous times)   Associated symptoms: no abdominal pain   Diarrhea:    Quality:  Semi-solid and watery   Severity:  Severe   Duration:  2 days   Timing:  Constant   Progression:  Unchanged Risk factors: not pregnant now and no prior abdominal surgery   Diarrhea Associated symptoms: vomiting (once)   Associated symptoms: no abdominal pain and no fever     Past Medical History  Diagnosis Date  . HTN (hypertension)   . GERD (gastroesophageal reflux disease)   . Hyperglycemia   . Allergic rhinitis   . Chronic cough   . Compression fracture   . Diverticulosis   . Hemorrhoids   . Osteoporosis     fosamax in past/ followed by Dr Lorin Mercy   . Hematuria   . Family history of adverse reaction to anesthesia     "my brother broke femur in 2014; he never really came out of anesthesia; was ok before the OR"  . Endometrial cancer ([place])   . Uterine cancer ([place])     S/P hysterectomy  . Breast cancer, left breast ([place]) 12/99    S/P mastectomy  . Basal cell carcinoma of buttock   . Diabetes  mellitus     borderline  . Arthritis     "right thumb; some in my back" (05/01/2015)  . Urinary incontinence    Past Surgical History  Procedure Laterality Date  . Total abdominal hysterectomy w/ bilateral salpingoophorectomy      endometrial cancer  . Mastectomy Left   . Basal cell carcinoma excision  10/08    "my rear end"  . Cataract extraction w/ intraocular lens  implant, bilateral  ~ 2010-2012  . Dexa  2/00    LS osteoporsis  . Dexa  2/02    same; OP, LS, spine, hip  . Dexa  3/04    BMD spine; low BMD hip  . Colonoscopy  3/02    Diverticulosis; hemorrhoids  . Dilation and curettage of uterus    . Shoulder surgery Right     "fixed a muscle; not rotator cuff"  . Carpal tunnel release Right   . Laparoscopic cholecystectomy  2009  . Total abdominal hysterectomy      TAH BSO  . Back surgery  2007    Dr. Lorin Mercy -disk (pt denies this hx on 05/01/2015)  . Orif ankle fracture Right 05/02/2015    Procedure: OPEN REDUCTION INTERNAL FIXATION (ORIF) ANKLE FRACTURE/BIMALL;  Surgeon: Leandrew Koyanagi, MD;  Location: [facility];  Service: Orthopedics;  Laterality: Right;   Family History  Problem Relation Age of Onset  . Colon cancer Brother   . Breast cancer Sister   . Breast cancer Other    Social History  Substance Use Topics  . Smoking status: Never Smoker   . Smokeless tobacco: Never Used  . Alcohol Use: No   OB History    Gravida Para Term Preterm AB TAB SAB Ectopic Multiple Living   0 0 0 0 0 0 0 0 0 0      Review of Systems  Constitutional: Negative for fever.  Respiratory: Negative for cough and shortness of breath.   Gastrointestinal: Positive for vomiting (once) and diarrhea (numerous times). Negative for abdominal pain.  All other systems reviewed and are negative.     Allergies  Amoxicillin-pot clavulanate; Clarithromycin; Codeine; Sulfonamide derivatives; and Tetracycline  Home Medications   Prior to Admission medications   Medication Sig Start Date End Date  Taking? Authorizing Provider  acetaminophen (TYLENOL) 650 MG CR tablet Take 1,300 mg by mouth 2 (two) times daily.      Historical Provider, MD  CALCIUM PO Take 500 mg by mouth 3 (three) times daily.     Historical Provider, MD  CARTIA XT 240 MG 24 hr capsule TAKE 1 CAPSULE EVERY DAY 10/23/14   Abner Greenspan, MD  chlorpheniramine (CHLOR-TRIMETON) 4 MG tablet Take 4 mg by mouth 2 (two) times daily as needed for allergies.    Historical Provider, MD  Cholecalciferol (VITAMIN D PO) Take 800 Units by mouth daily.     Historical Provider, MD  docusate sodium (COLACE) 100 MG capsule Take 100 mg by mouth daily as needed for mild constipation.     Historical Provider, MD  Multiple Vitamin (MULITIVITAMIN WITH MINERALS) TABS Take 1 tablet by mouth daily.    Historical Provider, MD  omeprazole (PRILOSEC) 40 MG capsule Take one capsule by mouth twice daily for stomach 06/05/15   Blanchie Serve, MD  ondansetron (ZOFRAN) 4 MG tablet Take 4 mg by mouth every 8 (eight) hours as needed for nausea or vomiting.    Historical Provider, MD  rOPINIRole (REQUIP) 1 MG tablet Take one tablet by mouth once daily for restless legs 06/05/15   Blanchie Serve, MD  senna-docusate (SENOKOT-S) 8.6-50 MG per tablet Take 2 tablets by mouth 2 (two) times daily. For constipation 06/05/15   Blanchie Serve, MD  spironolactone (ALDACTONE) 25 MG tablet Take one tablet by mouth once daily for hypertension 06/05/15   Blanchie Serve, MD  traMADol (ULTRAM) 50 MG tablet Take 1 tablet (50 mg total) by mouth every 6 (six) hours as needed for severe pain. 05/07/15   Monica Carter, DO   BP 169/60 mmHg  Pulse 122  Temp(Src) 101.5 F (38.6 C) (Rectal)  Resp 20  Ht 5' (1.524 m)  Wt 160 lb (72.576 kg)  BMI 31.25 kg/m2  SpO2 92%  LMP 07/08/1989 Physical Exam  Constitutional: She is oriented to person, place, and time. She appears well-developed and well-nourished. No distress.  HENT:  Head: Normocephalic and atraumatic.  Mouth/Throat: Oropharynx is  clear and moist.  Eyes: EOM are normal. Pupils are equal, round, and reactive to light.  Neck: Normal range of motion. Neck supple.  Cardiovascular: Regular rhythm.  Tachycardia present.  Exam reveals no friction rub.   No murmur heard. Pulmonary/Chest: Effort normal and breath sounds normal. No respiratory distress. She has no wheezes. She has no rales.  Abdominal: Soft. She exhibits no distension. There is no tenderness. There is no rebound.  Musculoskeletal: Normal range  of motion. She exhibits no edema.       Feet:  Neurological: She is alert and oriented to person, place, and time. No cranial nerve deficit. She exhibits normal muscle tone. Coordination normal.  Skin: No rash noted. She is not diaphoretic.  Nursing note and vitals reviewed.   ED Course  Procedures (including critical care time) Labs Review Labs Reviewed  CULTURE, BLOOD (ROUTINE X 2)  CULTURE, BLOOD (ROUTINE X 2)  URINE CULTURE  C DIFFICILE QUICK SCREEN W PCR REFLEX  OVA AND PARASITE EXAMINATION  STOOL CULTURE  COMPREHENSIVE METABOLIC PANEL  CBC WITH DIFFERENTIAL/PLATELET  URINALYSIS, ROUTINE W REFLEX MICROSCOPIC (NOT AT [facility])  I-STAT CG4 LACTIC ACID, ED    Imaging Review Ct Abdomen Pelvis W Contrast  06/15/2015  CLINICAL DATA:  Diarrhea for 2-3 weeks. History of hypertension and left breast cancer. Endometrial cancer. Prior hysterectomy and left mastectomy. EXAM: CT ABDOMEN AND PELVIS WITH CONTRAST TECHNIQUE: Multidetector CT imaging of the abdomen and pelvis was performed using the standard protocol following bolus administration of intravenous contrast. CONTRAST:  153mL OMNIPAQUE IOHEXOL 300 MG/ML  SOLN COMPARISON:  05/05/2015 FINDINGS: Linear areas of atelectasis in the lung bases bilaterally. No effusions. Heart is normal size. Mild elevation of the right hemidiaphragm. Prior cholecystectomy. Mild intrahepatic and extrahepatic biliary duct dilatation, likely related to post cholecystectomy state. No focal  hepatic abnormality. Spleen, pancreas, adrenals and kidneys are unremarkable. Stomach and small bowel are decompressed and unremarkable. There is descending colonic and sigmoid diverticulosis. Mild wall thickening diffusely in the rectosigmoid colon with mild mucosal enhancement. Findings may reflect colitis. Appendix is mildly prominent, 8-9 mm, increased in diameter since prior study. No surrounding inflammatory change. No free fluid, free air or adenopathy. Prior hysterectomy. No adnexal masses. Urinary bladder is decompressed. No acute bony abnormality or focal bone lesion. Degenerative changes in the thoracolumbar spine. IMPRESSION: Left colonic diverticulosis. Mild diffuse wall thickening in the mid to distal sigmoid colon and rectum with mucosal enhancement suggesting colitis. Appendix is borderline in diameter, 8-9 mm without surrounding inflammatory change. Doubt clinical significance. Recommend clinical correlation to completely exclude signs and symptoms of appendicitis. Electronically Signed   By: Rolm Baptise M.D.   On: 07/04/2015 12:01   Dg Chest Portable 1 View  06/28/2015  CLINICAL DATA:  Hypoxia EXAM: PORTABLE CHEST 1 VIEW COMPARISON:  05/03/2015 FINDINGS: Cardiomegaly again noted. There is chronic elevation of the right hemidiaphragm. Mild basilar atelectasis. No definite infiltrate or superimposed pulmonary edema. Atherosclerotic calcifications of the thoracic aorta. IMPRESSION: Cardiomegaly. Chronic elevation of the right hemidiaphragm. No definite infiltrate or pulmonary edema. Bilateral basilar atelectasis. Electronically Signed   By: Lahoma Crocker M.D.   On: 07/06/2015 11:02   I have personally reviewed and evaluated these images and lab results as part of my medical decision-making.   EKG Interpretation   Date/Time:  Wednesday June 25 2015 10:01:05 EDT Ventricular Rate:  120 PR Interval:  143 QRS Duration: 111 QT Interval:  335 QTC Calculation: 473 R Axis:   -168 Text  Interpretation:  Sinus tachycardia Baseline wander in lead(s) V2 No  significant change since last tracing Confirmed by Mingo Amber  MD, [place]  ([id number]) on 07/06/2015 10:14:41 AM      MDM   Final diagnoses:  Colitis  Sepsis, due to unspecified organism ([place])  Diarrhea, unspecified type    ED 8 rolled female here with diarrhea. Code sepsis called by nursing due to vital signs. She is febrile, hypoxic in the 70s, tachycardic in the 120s  to 130s. She is normotensive. She's been on antibiotics and I'm concerned this could be C. difficile. She denies any shortness of breath, chest pain, abdominal pain. She's had a little bit of vomiting, but that is yesterday. Here she has no belly pain. She has some mild purulent base at her surgical site on her right lateral ankle but no surrounding cellulitis. Vancomycin and Zosyn initiated for her by therapy, fluids initiated. Will plan on admission.    Madison Bucy, MD 07/08/2015 1538

## 2015-06-25 NOTE — ED Notes (Signed)
MD at bedside. 

## 2015-06-25 NOTE — H&P (Addendum)
[facility] History and Physical  [name] OXB:[id number] DOB: 07/08/1927 DOA: 06/26/2015  Referring physician: ER physician: Dr. Christian Mate PCP: Loura Pardon, MD  Chief Complaint: diarrhea   HPI:  81 year old female with past medical history of hypertension, constipation who presented to [facility] ED with multiple episodes of non bloody diarrhea for past few days prior to this admission. She also reported associate abdominal cramps, nausea an done episode of non bloody vomiting. No reports of fevers. Pt also reported that she has had ankle surgery back in august and had to be on Keflex for about 2 week period. No chest pain, shortness of breath or palpitations. No lightheadedness or loss of consciousness.   In ED, pt was hypoxic with O2 saturation of 77% on room air but this has improved to 99% with [place] oxygen support. T max was 101.5 F, HR 122, RR 18-33. Blood work was notable for WBC count of 22.9 otherwise unremarkable. CXR showed cardiomegaly but no definite pulmonary edema or consolidation. CT abdomen showed findings concerning for colitis. She was started on vanco, zosyn and flagyl as she met sepsis criteria.   Assessment & Plan    Principal Problem:   Sepsis ([place]) / Diarrhea / Colitis / Leukocytosis - Sepsis cirteria met on amdission with fever of 101.5 F, HR 122, RR 18-33, hypoxia of 77%, leukocytosis of 22.9 - Sepsis work up initiated - Source of infection thought to colitis, possible C.diff. CT abd on admission showed mild diffuse wall thickening in the mid to distal sigmoid colon and rectum suggestive of colitis.  - Check blood cultures, procalcitonin and lactic acid - Check stool for C.diff - Started vanco, flagyl, zosyn - Narrow down as soon as results of the above blood work available  - Monitor CBC daily - Hemodynamically stable, does not require pressor support   Active Problems:   Acute respiratory failure with hypoxia (HCC) - No acute findings on CXR - Unclear  etiology - Stable respiratory status - Oxygen saturaiton now 92%    Benign essential HTN - Resume Cardizem and aldactone    DVT prophylaxis:  - SCD's bilaterally   Radiological Exams on Admission: Ct Abdomen Pelvis W Contrast 06/15/2015   Left colonic diverticulosis. Mild diffuse wall thickening in the mid to distal sigmoid colon and rectum with mucosal enhancement suggesting colitis. Appendix is borderline in diameter, 8-9 mm without surrounding inflammatory change. Doubt clinical significance. Recommend clinical correlation to completely exclude signs and symptoms of appendicitis. Electronically Signed   By: Rolm Baptise M.D.   On: 06/24/2015 12:01   Dg Chest Portable 1 View 07/09/2015  Cardiomegaly. Chronic elevation of the right hemidiaphragm. No definite infiltrate or pulmonary edema. Bilateral basilar atelectasis. Electronically Signed   By: Lahoma Crocker M.D.   On: 06/18/2015 11:02     Code Status: Full Family Communication: Plan of care discussed with the patient  Disposition Plan: Admit for further evaluation  Leisa Lenz, MD  [facility] Pager [phone]  Time spent in minutes: 75 minutes  Review of Systems:  Constitutional: Negative for fever, chills and malaise/fatigue. Negative for diaphoresis.  HENT: Negative for hearing loss, ear pain, nosebleeds, congestion, sore throat, neck pain, tinnitus and ear discharge.   Eyes: Negative for blurred vision, double vision, photophobia, pain, discharge and redness.  Respiratory: Negative for cough, hemoptysis, sputum production, shortness of breath, wheezing and stridor.   Cardiovascular: Negative for chest pain, palpitations, orthopnea, claudication and leg swelling.  Gastrointestinal: per HPI.  Genitourinary: Negative  for dysuria, urgency, frequency, hematuria and flank pain.  Musculoskeletal: Negative for myalgias, back pain, joint pain and falls.  Skin: Negative for itching and rash.  Neurological: Negative for dizziness  and weakness. Negative for tingling, tremors, sensory change, speech change, focal weakness, loss of consciousness and headaches.  Endo/Heme/Allergies: Negative for environmental allergies and polydipsia. Does not bruise/bleed easily.  Psychiatric/Behavioral: Negative for suicidal ideas. The patient is not nervous/anxious.      Past Medical History  Diagnosis Date  . HTN (hypertension)   . GERD (gastroesophageal reflux disease)   . Hyperglycemia   . Allergic rhinitis   . Chronic cough   . Compression fracture   . Diverticulosis   . Hemorrhoids   . Osteoporosis     fosamax in past/ followed by Dr Lorin Mercy   . Hematuria   . Family history of adverse reaction to anesthesia     "my brother broke femur in 2014; he never really came out of anesthesia; was ok before the OR"  . Endometrial cancer ([place])   . Uterine cancer ([place])     S/P hysterectomy  . Breast cancer, left breast ([place]) 12/99    S/P mastectomy  . Basal cell carcinoma of buttock   . Diabetes mellitus     borderline  . Arthritis     "right thumb; some in my back" (05/01/2015)  . Urinary incontinence    Past Surgical History  Procedure Laterality Date  . Total abdominal hysterectomy w/ bilateral salpingoophorectomy      endometrial cancer  . Mastectomy Left   . Basal cell carcinoma excision  10/08    "my rear end"  . Cataract extraction w/ intraocular lens  implant, bilateral  ~ 2010-2012  . Dexa  2/00    LS osteoporsis  . Dexa  2/02    same; OP, LS, spine, hip  . Dexa  3/04    BMD spine; low BMD hip  . Colonoscopy  3/02    Diverticulosis; hemorrhoids  . Dilation and curettage of uterus    . Shoulder surgery Right     "fixed a muscle; not rotator cuff"  . Carpal tunnel release Right   . Laparoscopic cholecystectomy  2009  . Total abdominal hysterectomy      TAH BSO  . Back surgery  2007    Dr. Lorin Mercy -disk (pt denies this hx on 05/01/2015)  . Orif ankle fracture Right 05/02/2015    Procedure: OPEN REDUCTION  INTERNAL FIXATION (ORIF) ANKLE FRACTURE/BIMALL;  Surgeon: Leandrew Koyanagi, MD;  Location: [facility];  Service: Orthopedics;  Laterality: Right;   Social History:  reports that she has never smoked. She has never used smokeless tobacco. She reports that she does not drink alcohol or use illicit drugs.  Allergies  Allergen Reactions  . Amoxicillin-Pot Clavulanate Nausea Only  . Clarithromycin Nausea Only  . Codeine Nausea Only  . Sulfonamide Derivatives Nausea Only  . Tetracycline Nausea Only    Family History:  Family History  Problem Relation Age of Onset  . Colon cancer Brother   . Breast cancer Sister   . Breast cancer Other      Prior to Admission medications   Medication Sig Start Date End Date Taking? Authorizing Provider  acetaminophen (TYLENOL) 650 MG CR tablet Take 1,300 mg by mouth 2 (two) times daily.     Yes Historical Provider, MD  CARTIA XT 240 MG 24 hr capsule TAKE 1 CAPSULE EVERY DAY 10/23/14  Yes Abner Greenspan, MD  cephALEXin (  KEFLEX) 500 MG capsule Take 500 mg by mouth 4 (four) times daily. Started 10/03 06/16/15  Yes Historical Provider, MD  chlorpheniramine (CHLOR-TRIMETON) 4 MG tablet Take 4 mg by mouth 2 (two) times daily as needed for allergies.   Yes Historical Provider, MD  Cholecalciferol (VITAMIN D PO) Take 800 Units by mouth daily.    Yes Historical Provider, MD  docusate sodium (COLACE) 100 MG capsule Take 100 mg by mouth daily as needed for mild constipation.    Yes Historical Provider, MD  Multiple Vitamin (MULITIVITAMIN WITH MINERALS) TABS Take 1 tablet by mouth daily.   Yes Historical Provider, MD  omeprazole (PRILOSEC) 40 MG capsule Take one capsule by mouth twice daily for stomach 06/05/15  Yes Mahima Bubba Camp, MD  ondansetron (ZOFRAN) 4 MG tablet Take 4 mg by mouth every 8 (eight) hours as needed for nausea or vomiting.   Yes Historical Provider, MD  rOPINIRole (REQUIP) 1 MG tablet Take one tablet by mouth once daily for restless legs 06/05/15  Yes Mahima  Pandey, MD  senna-docusate (SENOKOT-S) 8.6-50 MG per tablet Take 2 tablets by mouth 2 (two) times daily. For constipation 06/05/15  Yes Mahima Bubba Camp, MD  spironolactone (ALDACTONE) 25 MG tablet Take one tablet by mouth once daily for hypertension 06/05/15  Yes Mahima Bubba Camp, MD  traMADol (ULTRAM) 50 MG tablet Take 1 tablet (50 mg total) by mouth every 6 (six) hours as needed for severe pain. Patient taking differently: Take 50 mg by mouth every 6 (six) hours as needed for moderate pain or severe pain.  05/07/15  Yes Gildardo Cranker, DO   Physical Exam: Filed Vitals:   06/24/2015 1331 07/01/2015 1333 07/07/2015 1335 06/21/2015 1500  BP: 120/51   112/41  Pulse: 102   116  Temp:  98.8 F (37.1 C) 98.8 F (37.1 C) 98.8 F (37.1 C)  TempSrc:  Oral Oral Oral  Resp: 27   20  Height:      Weight:      SpO2: 95%   92%    Physical Exam  Constitutional: Appears well-developed and well-nourished. No distress.  HENT: Normocephalic. No tonsillar erythema or exudates Eyes: Conjunctivae are normal. No scleral icterus.  Neck: Normal ROM. Neck supple. No JVD. No tracheal deviation. No thyromegaly.  CVS: tachycardia, S1/S2 appreciated   Pulmonary: Effort and breath sounds normal, no stridor, rhonchi, wheezes, rales.  Abdominal: Soft. BS +,  no distension, tenderness, rebound or guarding.  Musculoskeletal: Normal range of motion. No edema and no tenderness.  Lymphadenopathy: No lymphadenopathy noted, cervical, inguinal. Neuro: Alert. Normal reflexes, muscle tone coordination. No focal neurologic deficits. Skin: Skin is warm and dry. No rash noted.  No erythema. No pallor.  Psychiatric: Normal mood and affect. Behavior, judgment, thought content normal.   Labs on Admission:  Basic Metabolic Panel:  Recent Labs Lab 06/20/2015 0955  NA 134*  K 3.5  CL 100*  CO2 24  GLUCOSE 139*  BUN 14  CREATININE 0.84  CALCIUM 8.8*   Liver Function Tests:  Recent Labs Lab 06/16/2015 0955  AST 28  ALT 22  ALKPHOS  82  BILITOT 0.7  PROT 7.6  ALBUMIN 3.6   No results for input(s): LIPASE, AMYLASE in the last 168 hours. No results for input(s): AMMONIA in the last 168 hours. CBC:  Recent Labs Lab 06/17/2015 0955  WBC 22.9*  NEUTROABS 20.4*  HGB 12.9  HCT 38.2  MCV 91.0  PLT 339   Cardiac Enzymes:  Recent Labs Lab 07/08/2015 0955  TROPONINI <0.03   BNP: Invalid input(s): POCBNP CBG: No results for input(s): GLUCAP in the last 168 hours.  If 7PM-7AM, please contact night-coverage [email or web address] Password [facility] 07/13/2015, 4:33 PM

## 2015-06-25 NOTE — ED Notes (Signed)
Per [facility]- Pt started on antibiotics for wound on right ankle at beginning of October. N/V/D since.

## 2015-06-26 DIAGNOSIS — A09 Infectious gastroenteritis and colitis, unspecified: Secondary | ICD-10-CM

## 2015-06-26 DIAGNOSIS — D649 Anemia, unspecified: Secondary | ICD-10-CM

## 2015-06-26 DIAGNOSIS — A0472 Enterocolitis due to Clostridium difficile, not specified as recurrent: Secondary | ICD-10-CM | POA: Diagnosis present

## 2015-06-26 LAB — BASIC METABOLIC PANEL
ANION GAP: 12 (ref 5–15)
BUN: 17 mg/dL (ref 6–20)
CO2: 20 mmol/L — AB (ref 22–32)
Calcium: 7.9 mg/dL — ABNORMAL LOW (ref 8.9–10.3)
Chloride: 103 mmol/L (ref 101–111)
Creatinine, Ser: 1.14 mg/dL — ABNORMAL HIGH (ref 0.44–1.00)
GFR calc Af Amer: 49 mL/min — ABNORMAL LOW (ref >60)
GFR, EST NON AFRICAN AMERICAN: 42 mL/min — AB (ref >60)
GLUCOSE: 202 mg/dL — AB (ref 65–99)
POTASSIUM: 3.6 mmol/L (ref 3.5–5.1)
Sodium: 135 mmol/L (ref 135–145)

## 2015-06-26 LAB — URINE CULTURE: CULTURE: NO GROWTH

## 2015-06-26 LAB — CBC
HEMATOCRIT: 35.1 % — AB (ref 36.0–46.0)
HEMOGLOBIN: 11.4 g/dL — AB (ref 12.0–15.0)
MCH: 29.8 pg (ref 26.0–34.0)
MCHC: 32.5 g/dL (ref 30.0–36.0)
MCV: 91.6 fL (ref 78.0–100.0)
Platelets: 285 10*3/uL (ref 150–400)
RBC: 3.83 MIL/uL — ABNORMAL LOW (ref 3.87–5.11)
RDW: 14.3 % (ref 11.5–15.5)
WBC: 21.3 10*3/uL — ABNORMAL HIGH (ref 4.0–10.5)

## 2015-06-26 LAB — C DIFFICILE QUICK SCREEN W PCR REFLEX
C DIFFICLE (CDIFF) ANTIGEN: POSITIVE — AB
C Diff interpretation: POSITIVE
C Diff toxin: POSITIVE — AB

## 2015-06-26 LAB — GLUCOSE, CAPILLARY: Glucose-Capillary: 150 mg/dL — ABNORMAL HIGH (ref 65–99)

## 2015-06-26 MED ORDER — VANCOMYCIN 50 MG/ML ORAL SOLUTION
125.0000 mg | Freq: Four times a day (QID) | ORAL | Status: DC
  Administered 2015-06-26 – 2015-06-27 (×9): 125 mg via ORAL
  Filled 2015-06-26 (×13): qty 2.5

## 2015-06-26 MED ORDER — ACETAMINOPHEN 500 MG PO TABS
500.0000 mg | ORAL_TABLET | Freq: Once | ORAL | Status: AC
  Administered 2015-06-26: 500 mg via ORAL
  Filled 2015-06-26: qty 1

## 2015-06-26 MED ORDER — MAGNESIUM SULFATE 2 GM/50ML IV SOLN
2.0000 g | Freq: Once | INTRAVENOUS | Status: AC
  Administered 2015-06-26: 2 g via INTRAVENOUS
  Filled 2015-06-26: qty 50

## 2015-06-26 MED ORDER — SACCHAROMYCES BOULARDII 250 MG PO CAPS: 250.0000 mg | ORAL_CAPSULE | Freq: Two times a day (BID) | ORAL | Status: DC

## 2015-06-26 MED ORDER — VANCOMYCIN HCL IN DEXTROSE 750-5 MG/150ML-% IV SOLN
750.0000 mg | INTRAVENOUS | Status: DC
Start: 2015-06-27 — End: 2015-06-27
  Filled 2015-06-26: qty 150

## 2015-06-26 MED ORDER — SODIUM CHLORIDE 0.9 % IV SOLN
INTRAVENOUS | Status: AC
  Administered 2015-06-26 (×2): via INTRAVENOUS

## 2015-06-26 NOTE — Care Management Note (Signed)
Case Management Note  Patient Details  Name: [name] MRN: [id number] Date of Birth: [birth year]  Subjective/Objective:  81 y/o f admitted w/Sepsps/UTI/colitis. From home.PT cons-await recommendations. Will confirm if Active w/[facility].                  Action/Plan:Monitor progress for d/c plans.   Expected Discharge Date:   (Unknown)               Expected Discharge Plan:  [place]  In-House Referral:     Discharge planning Services  CM Consult  Post Acute Care Choice:    Choice offered to:     DME Arranged:    DME Agency:     HH Arranged:    HH Agency:     Status of Service:  In process, will continue to follow  Medicare Important Message Given:    Date Medicare IM Given:    Medicare IM give by:    Date Additional Medicare IM Given:    Additional Medicare Important Message give by:     If discussed at [place] of Stay Meetings, dates discussed:    Additional Comments:  Dessa Phi, RN 06/26/2015, 2:30 PM

## 2015-06-26 NOTE — Progress Notes (Addendum)
Patient ID: [name], female   DOB: [birth year], 81 y.o.   MRN: [id number] [facility] PROGRESS NOTE  Leontine Radman YSA:[id number] DOB: [birth year] DOA: 07/11/2015 PCP: Loura Pardon, MD  Brief narrative:    81 year old female with past medical history of hypertension, constipation who presented to [facility] ED with multiple episodes of non bloody diarrhea and abdominal cramps for past few days prior to this admission. Patient also reported associated nausea and one episode of vomiting. On admission, patient was found to have hypoxia of 77% on room air which has improved with nasal cannula oxygen support. T max was 101.5 F, HR 122, RR 18-33. Blood work was notable for WBC count of 22.9 otherwise unremarkable. CXR showed cardiomegaly but no definite pulmonary edema or consolidation. CT abdomen showed findings concerning for colitis. She was started on vanco, zosyn and flagyl for sepsis secondary to possible C. difficile colitis. Sure enough her stool for C. difficile was positive.   Anticipated discharge: Discharge once diarrhea subsides, likely by 07-15-2015.  Assessment/Plan:    Principal Problem:  Sepsis ([place]) / C. difficile colitis / leukocytosis - Sepsis cirteria met on amdission with fever of 101.5 F, HR 122, RR 18-33, hypoxia of 77%, leukocytosis of 22.9. - Source of infection C. difficile colitis. CT abdomen on the admission demonstrated mild diffuse rectal and distal sigmoid wall thickening concerning for colitis. In addition, stool is positive for C. difficile toxin and antigen. - Sepsis workup was initiated at the time of the admission. Lactic acid was within normal limits. Procalcitonin was 4.94. - Patient initially on vancomycin, Zosyn and Flagyl. We will continue these abx but add vanco PO due to worsening leukocytosis and ongoing fever. - Monitor daily CBC  Active Problems:  Acute respiratory failure with hypoxia (HCC) - Stable respiratory status. Chest x-ray did not  demonstrate acute cardiopulmonary findings.  - Oxygen saturation is 92% with nasal cannula oxygen support.    Benign essential HTN - Blood pressure 143/47 - Continue Cardizem and Aldactone    Acute renal failure - Likely secondary to sepsis, dehydration, GI losses. Could also be secondary to vancomycin IV. - As soon as blood culture results are reported we will narrow down antibiotics, vancomycin and Zosyn. - Continue IV fluids - Follow-up BMP tomorrow morning    Hypomagnesemia - Secondary to diarrhea, GI losses - Supplemented today with magnesium sulfate 2 g IV.    Normocytic anemia - Hemoglobin stable.  DVT prophylaxis:  - SCD's bilaterally in hospital   Code Status: Full.  Family Communication:  plan of care discussed with the patient Disposition Plan: Home once diarrhea subsides  IV access:  Peripheral IV  Procedures and diagnostic studies:    Ct Abdomen Pelvis W Contrast 07/08/2015 Left colonic diverticulosis. Mild diffuse wall thickening in the mid to distal sigmoid colon and rectum with mucosal enhancement suggesting colitis. Appendix is borderline in diameter, 8-9 mm without surrounding inflammatory change. Doubt clinical significance. Recommend clinical correlation to completely exclude signs and symptoms of appendicitis. Electronically Signed By: Rolm Baptise M.D. On: 07/13/2015 12:01   Dg Chest Portable 1 View 06/27/2015 Cardiomegaly. Chronic elevation of the right hemidiaphragm. No definite infiltrate or pulmonary edema. Bilateral basilar atelectasis. Electronically Signed By: Lahoma Crocker M.D. On: 06/28/2015 11:02   Medical Consultants:  None  Other Consultants:  None  IAnti-Infectives:   Vancomycin IV 07/01/2015 --> Zosyn 07/10/2015 -->  Flagyl 06/24/2015 --> Vanco by mouth 06/26/2015 -->   Leisa Lenz, MD  [facility] Pager [phone]  Time spent in minutes: 25 minutes  If 7PM-7AM, please contact  night-coverage [email or web address] Password [email or web address] 06/26/2015, 9:58 AM   LOS: 1 day    HPI/Subjective: No acute overnight events. Patient reports ongoing diarrhea.   Objective: Filed Vitals:   07/02/2015 1500 06/16/2015 2215 06/26/15 0445 06/26/15 0544  BP: 112/41 131/43 143/47   Pulse: 116 111 104   Temp: 98.8 F (37.1 C) 100 F (37.8 C) 101.7 F (38.7 C) 99 F (37.2 C)  TempSrc: Oral Oral Oral Oral  Resp: _0 Height:      Weight:      SpO2: 92% 92% 92%     Intake/Output Summary (Last 24 hours) at 06/26/15 0958 Last data filed at 06/26/15 0831  Gross per 24 hour  Intake   1190 ml  Output      0 ml  Net   1190 ml    Exam:   General:  Pt is alert, follows commands appropriately, not in acute distress  Cardiovascular: Regular rate and rhythm, S1/S2 (+)  Respiratory: Clear to auscultation bilaterally, no wheezing, no crackles, no rhonchi  Abdomen: Soft, non tender, non distended, bowel sounds present  Extremities: No edema, pulses DP and PT palpable bilaterally  Neuro: Grossly nonfocal  Data Reviewed: Basic Metabolic Panel:  Recent Labs Lab 06/16/2015 0955 07/02/2015 1600 06/26/15 0530  NA 134* 133* 135  K 3.5 3.8 3.6  CL 100* 102 103  CO2 24 22 20*  GLUCOSE 139* 119* 202*  BUN _1 CREATININE 0.84 0.88 1.14*  CALCIUM 8.8* 8.2* 7.9*  MG  --  1.4*  --   PHOS  --  3.7  --    Liver Function Tests:  Recent Labs Lab 06/14/2015 0955 07/07/2015 1600  AST 28 27  ALT 22 21  ALKPHOS 82 75  BILITOT 0.7 1.0  PROT 7.6 7.1  ALBUMIN 3.6 3.1*   No results for input(s): LIPASE, AMYLASE in the last 168 hours. No results for input(s): AMMONIA in the last 168 hours. CBC:  Recent Labs Lab 06/28/2015 0955 06/14/2015 1600 06/26/15 0530  WBC 22.9* 18.6* 21.3*  NEUTROABS 20.4* 17.3*  --   HGB 12.9 11.8* 11.4*  HCT 38.2 35.5* 35.1*  MCV 91.0 91.0 91.6  PLT 339 317 285   Cardiac Enzymes:  Recent Labs Lab 06/15/2015 0955  TROPONINI <0.03   BNP: Invalid  input(s): POCBNP CBG:  Recent Labs Lab 06/26/15 0747  GLUCAP 150*    Urine culture     Status: None (Preliminary result)   Collection Time: 07/14/2015  1:17 PM  Result Value Ref Range Status   Specimen Description URINE, CATHETERIZED  Final   Special Requests NONE  Final   Culture   Final    NO GROWTH < 24 HOURS Performed at [facility]    Report Status PENDING  Incomplete  C difficile quick scan w PCR reflex     Status: Abnormal   Collection Time: 06/27/2015  6:23 PM  Result Value Ref Range Status   C Diff antigen POSITIVE (A) NEGATIVE Final   C Diff toxin POSITIVE (A) NEGATIVE Final   C Diff interpretation Positive for toxigenic C. difficile  Final     Scheduled Meds: . acetaminophen  650 mg Oral TID PC & HS  . cholecalciferol  800 Units Oral Daily  . diltiazem  240 mg Oral Daily  . metronidazole  500 mg Intravenous Q8H  . multivitamin with minerals  1 tablet Oral Daily  .  piperacillin-tazobactam  3.375 g Intravenous Once  . piperacillin-tazobactam (ZOSYN)  IV  3.375 g Intravenous Q8H  . rOPINIRole  1 mg Oral Daily  . saccharomyces boulardii  250 mg Oral BID  . spironolactone  25 mg Oral Daily  . vancomycin  125 mg Oral QID  . vancomycin  750 mg Intravenous Q12H   Continuous Infusions:

## 2015-06-26 NOTE — Progress Notes (Addendum)
ANTIBIOTIC CONSULT NOTE - Follow-Up  Pharmacy Consult for Vancomycin IV/PO and Zosyn Indication: rule out sepsis/C.diff  Allergies  Allergen Reactions  . Amoxicillin-Pot Clavulanate Nausea Only  . Clarithromycin Nausea Only  . Codeine Nausea Only  . Sulfonamide Derivatives Nausea Only  . Tetracycline Nausea Only    Patient Measurements: Height: 5' (152.4 cm) Weight: 160 lb (72.576 kg) IBW/kg (Calculated) : 45.5  Vital Signs: Temp: 99 F (37.2 C) (10/13 0544) Temp Source: Oral (10/13 0544) BP: 143/47 mmHg (10/13 0445) Pulse Rate: 104 (10/13 0445) Intake/Output from previous day: 10/12 0701 - 10/13 0700 In: 950 [I.V.:500; IV Piggyback:450] Out: -  Intake/Output from this shift: Total I/O In: 240 [P.O.:240] Out: -   Labs:  Recent Labs  07/14/2015 0955 06/24/2015 1600 06/26/15 0530  WBC 22.9* 18.6* 21.3*  HGB 12.9 11.8* 11.4*  PLT 339 317 285  CREATININE 0.84 0.88 1.14*   Estimated Creatinine Clearance: 31.5 mL/min (by C-G formula based on Cr of 1.14). No results for input(s): VANCOTROUGH, VANCOPEAK, VANCORANDOM, GENTTROUGH, GENTPEAK, GENTRANDOM, TOBRATROUGH, TOBRAPEAK, TOBRARND, AMIKACINPEAK, AMIKACINTROU, AMIKACIN in the last 72 hours.   Microbiology: Recent Results (from the past 720 hour(s))  Urine culture     Status: None (Preliminary result)   Collection Time: 06/24/2015  1:17 PM  Result Value Ref Range Status   Specimen Description URINE, CATHETERIZED  Final   Special Requests NONE  Final   Culture   Final    NO GROWTH < 24 HOURS Performed at [facility]    Report Status PENDING  Incomplete  C difficile quick scan w PCR reflex     Status: Abnormal   Collection Time: 07/03/2015  6:23 PM  Result Value Ref Range Status   C Diff antigen POSITIVE (A) NEGATIVE Final   C Diff toxin POSITIVE (A) NEGATIVE Final    Comment: CRITICAL RESULT CALLED TO, READ BACK BY AND VERIFIED WITH: TOM RYAN RN 10.12.16 @ [place]    C Diff interpretation Positive for  toxigenic C. difficile  Final    Comment: CRITICAL RESULT CALLED TO, READ BACK BY AND VERIFIED WITH: TOM RYAN RN 10.12.16 @ [place]     Medical History: Past Medical History  Diagnosis Date  . HTN (hypertension)   . GERD (gastroesophageal reflux disease)   . Hyperglycemia   . Allergic rhinitis   . Chronic cough   . Compression fracture   . Diverticulosis   . Hemorrhoids   . Osteoporosis     fosamax in past/ followed by Dr Lorin Mercy   . Hematuria   . Family history of adverse reaction to anesthesia     "my brother broke femur in 2014; he never really came out of anesthesia; was ok before the OR"  . Endometrial cancer ([place])   . Uterine cancer ([place])     S/P hysterectomy  . Breast cancer, left breast ([place]) 12/99    S/P mastectomy  . Basal cell carcinoma of buttock   . Diabetes mellitus     borderline  . Arthritis     "right thumb; some in my back" (05/01/2015)  . Urinary incontinence     Medications:  Scheduled:  . acetaminophen  650 mg Oral TID PC & HS  . cholecalciferol  800 Units Oral Daily  . diltiazem  240 mg Oral Daily  . magnesium sulfate 1 - 4 g bolus IVPB  2 g Intravenous Once  . metronidazole  500 mg Intravenous Q8H  . multivitamin with minerals  1  tablet Oral Daily  . piperacillin-tazobactam  3.375 g Intravenous Once  . piperacillin-tazobactam (ZOSYN)  IV  3.375 g Intravenous Q8H  . rOPINIRole  1 mg Oral Daily  . saccharomyces boulardii  250 mg Oral BID  . spironolactone  25 mg Oral Daily  . vancomycin  125 mg Oral QID  . [START ON 06/27/2015] vancomycin  750 mg Intravenous Q24H   Infusions:    PRN:   Assessment: 81 yo female presents with weakness and diarrhea. She is s/p ORIF ankle fracture 05/02/2015 and was placed on antibiotics ~2 weeks ago for cellulitis around the surgical site.  Code sepsis was called 09:49 and Pharmacy is consulted to dose vancomycin and Zosyn. Pt has positive C. diff antigen for which PO vancomycin and IV Flagyl have been  started. Per MD note, will continue both due to elevated temp and worsening leukocytosis.     10/13: Tmax: 101.7 F, WBC increasing (21.3). SCr increased.  10/12 >> Vanc IV >> 10/12 >> Zosyn >> 10/12 >> Flagyl >> 10/12 >> Vanc PO >>   10/12 blood x 2: IP 10/12 stool cx: IP 10/12 C.diff: positive 10/12 urine: IP  Goal of Therapy:  Vancomycin trough level 15-20 mcg/ml  Zosyn dosing appropriate for renal function  Plan:  Vancomycin 1500mg  IV and Zosyn 3.375g IV given in ED ~11:00  Changed  vancomycin 750mg  IV from q12h to q24h due to worsening renal function. Check trough at steady state Zosyn 3.375gm IV q8h (4hr extended infusions) Follow up renal function & cultures, clinical course Pt was also started on vancomycin 125 mg PO q6h for C. Diff x 14 days. Dose appropriate.   Royetta Asal, PharmD, BCPS 06/26/15 11:11 Pager [phone]

## 2015-06-27 LAB — BASIC METABOLIC PANEL
ANION GAP: 13 (ref 5–15)
BUN: 32 mg/dL — ABNORMAL HIGH (ref 6–20)
CO2: 16 mmol/L — ABNORMAL LOW (ref 22–32)
Calcium: 7.6 mg/dL — ABNORMAL LOW (ref 8.9–10.3)
Chloride: 102 mmol/L (ref 101–111)
Creatinine, Ser: 1.91 mg/dL — ABNORMAL HIGH (ref 0.44–1.00)
GFR, EST AFRICAN AMERICAN: 26 mL/min — AB (ref >60)
GFR, EST NON AFRICAN AMERICAN: 23 mL/min — AB (ref >60)
Glucose, Bld: 156 mg/dL — ABNORMAL HIGH (ref 65–99)
POTASSIUM: 3.8 mmol/L (ref 3.5–5.1)
SODIUM: 131 mmol/L — AB (ref 135–145)

## 2015-06-27 LAB — VANCOMYCIN, RANDOM: Vancomycin Rm: 12 ug/mL

## 2015-06-27 LAB — GLUCOSE, CAPILLARY: Glucose-Capillary: 157 mg/dL — ABNORMAL HIGH (ref 65–99)

## 2015-06-27 MED ORDER — PIPERACILLIN-TAZOBACTAM IN DEX 2-0.25 GM/50ML IV SOLN
2.2500 g | Freq: Three times a day (TID) | INTRAVENOUS | Status: DC
  Filled 2015-06-27: qty 50

## 2015-06-27 MED ORDER — VANCOMYCIN HCL IN DEXTROSE 1-5 GM/200ML-% IV SOLN
1000.0000 mg | Freq: Once | INTRAVENOUS | Status: AC
  Administered 2015-06-27: 1000 mg via INTRAVENOUS
  Filled 2015-06-27: qty 200

## 2015-06-27 NOTE — Progress Notes (Addendum)
ANTIBIOTIC CONSULT NOTE - Follow-Up  Pharmacy Consult for Vancomycin IV/PO and Zosyn Indication: rule out sepsis/C.diff  Allergies  Allergen Reactions  . Amoxicillin-Pot Clavulanate Nausea Only  . Clarithromycin Nausea Only  . Codeine Nausea Only  . Sulfonamide Derivatives Nausea Only  . Tetracycline Nausea Only    Patient Measurements: Height: 5' (152.4 cm) Weight: 160 lb (72.576 kg) IBW/kg (Calculated) : 45.5  Vital Signs: Temp: 98.4 F (36.9 C) (10/14 0630) Temp Source: Oral (10/14 0630) BP: 155/58 mmHg (10/14 0630) Pulse Rate: 99 (10/14 0630) Intake/Output from previous day: 10/13 0701 - 10/14 0700 In: 925 [P.O.:240; I.V.:685] Out: -  Intake/Output from this shift:    Labs:  Recent Labs  06/21/2015 0955 07/03/2015 1600 06/26/15 0530 06/27/15 0600  WBC 22.9* 18.6* 21.3*  --   HGB 12.9 11.8* 11.4*  --   PLT 339 317 285  --   CREATININE 0.84 0.88 1.14* 1.91*   Estimated Creatinine Clearance: 18.8 mL/min (by C-G formula based on Cr of 1.91). No results for input(s): VANCOTROUGH, VANCOPEAK, VANCORANDOM, GENTTROUGH, GENTPEAK, GENTRANDOM, TOBRATROUGH, TOBRAPEAK, TOBRARND, AMIKACINPEAK, AMIKACINTROU, AMIKACIN in the last 72 hours.   Microbiology: Recent Results (from the past 720 hour(s))  Blood Culture (routine x 2)     Status: None (Preliminary result)   Collection Time: 06/22/2015  9:55 AM  Result Value Ref Range Status   Specimen Description BLOOD LEFT HAND  Final   Special Requests BOTTLES DRAWN AEROBIC AND ANAEROBIC 5ML  Final   Culture   Final    NO GROWTH 1 DAY Performed at [facility]    Report Status PENDING  Incomplete  Blood Culture (routine x 2)     Status: None (Preliminary result)   Collection Time: 07/14/2015 10:43 AM  Result Value Ref Range Status   Specimen Description BLOOD RIGHT HAND  Final   Special Requests BOTTLES DRAWN AEROBIC ONLY 5ML  Final   Culture   Final    NO GROWTH 1 DAY Performed at [facility]    Report Status  PENDING  Incomplete  Urine culture     Status: None   Collection Time: 06/22/2015  1:17 PM  Result Value Ref Range Status   Specimen Description URINE, CATHETERIZED  Final   Special Requests NONE  Final   Culture   Final    NO GROWTH 1 DAY Performed at [facility]    Report Status 06/26/2015 FINAL  Final  C difficile quick scan w PCR reflex     Status: Abnormal   Collection Time: 07/13/2015  6:23 PM  Result Value Ref Range Status   C Diff antigen POSITIVE (A) NEGATIVE Final   C Diff toxin POSITIVE (A) NEGATIVE Final    Comment: CRITICAL RESULT CALLED TO, READ BACK BY AND VERIFIED WITH: TOM RYAN RN 10.12.16 @ [place]    C Diff interpretation Positive for toxigenic C. difficile  Final    Comment: CRITICAL RESULT CALLED TO, READ BACK BY AND VERIFIED WITH: TOM RYAN RN 10.12.16 @ 1950 BY RICEJ     Medical History: Past Medical History  Diagnosis Date  . HTN (hypertension)   . GERD (gastroesophageal reflux disease)   . Hyperglycemia   . Allergic rhinitis   . Chronic cough   . Compression fracture   . Diverticulosis   . Hemorrhoids   . Osteoporosis     fosamax in past/ followed by Dr Lorin Mercy   . Hematuria   . Family history of adverse reaction to anesthesia     "  my brother broke femur in 2014; he never really came out of anesthesia; was ok before the OR"  . Endometrial cancer ([place])   . Uterine cancer ([place])     S/P hysterectomy  . Breast cancer, left breast ([place]) 12/99    S/P mastectomy  . Basal cell carcinoma of buttock   . Diabetes mellitus     borderline  . Arthritis     "right thumb; some in my back" (05/01/2015)  . Urinary incontinence     Assessment: 81 yo female presented [facility] ED on 10/12 with weakness and diarrhea. She is s/p ORIF ankle fracture 05/02/2015 and was placed on antibiotics ~2 weeks ago for cellulitis around the surgical site.  Code sepsis was called 09:49 and pharmacy consulted to dose Vancomycin and Zosyn. Pt has positive C. diff antigen for  which PO vancomycin and IV Flagyl have been started. Per MD note, will continue both due to elevated temp and worsening leukocytosis.   10/12 >> Vanc IV >> 10/12 >> Zosyn >> 10/12 >> Flagyl >> 10/12 >> Vanc PO >>  10/12 blood x 2: NGTD 10/12 urine: NGF 10/12 C.diff: antigen + toxin positive  Tmax: 99.35F WBC: elevated and increased (10/13) SCr rising, 0.88 > 1.14 > 1.91, CrCl now ~ 19 ml/min CG   Goal of Therapy:  Vancomycin trough level 15-20 mcg/ml  Appropriate antibiotic dosing for renal function and indication Eradication of infection  Plan:   D/C scheduled Vancomycin due to rising SCr.  Check random Vancomycin level this AM. Re-dose if < 20.  Change Zosyn to 2.25g IV q8h.  Continue Vancomycin 125mg  PO QID x 14 days for C.diff. Consider discontinuing IV Flagyl once WBC, fever curve improving. Follow up renal function, cultures, de-escalation of therapy, clinical course.    Lindell Spar, PharmD, BCPS Pager: [phone] 06/27/2015 8:45 AM     Addendum: Assessment: Random Vancomycin level returned at 12 mcg/mL (not a steady state level)  Plan:  Give Vancomycin 1g IV x 1 dose now.  Check BMET in AM and assess trend in renal function to determine future IV Vancomycin dosing.   Lindell Spar, PharmD, BCPS Pager: [phone] 06/27/2015 10:07 AM

## 2015-06-27 NOTE — Evaluation (Signed)
Physical Therapy Evaluation Patient Details Name: [name] MRN: [id number] DOB: 1927-05-14 Today's Date: 06/27/2015   History of Present Illness  81 year old female with past medical history of hypertension, constipation, R ankle fx 1 month ago, compression fx, DM, back surgery who presented to [facility] ED with multiple episodes of non bloody diarrhea for past few days prior to this admission.  Dx of c diff, sepsis, respiratory failure, renal failure.   Clinical Impression  Pt admitted with above diagnosis. Pt currently with functional limitations due to the deficits listed below (see PT Problem List). Pt ambulated 12' with RW and min/guard assist, distance limited by dyspnea, SaO2 93% on RA walking. Pt has significantly decreased activity tolerance. Good progress expected.  Pt will benefit from skilled PT to increase their independence and safety with mobility to allow discharge to the venue listed below.       Follow Up Recommendations Home health PT    Equipment Recommendations  None recommended by PT    Recommendations for Other Services       Precautions / Restrictions Precautions Precautions: Fall Required Braces or Orthoses: Other Brace/Splint Other Brace/Splint: boot R ankle Restrictions Weight Bearing Restrictions: No Other Position/Activity Restrictions: WBAT RLE with boot per pt      Mobility  Bed Mobility               General bed mobility comments: NT- up in chair  Transfers Overall transfer level: Needs assistance Equipment used: Rolling walker (2 wheeled) Transfers: Sit to/from Stand Sit to Stand: Min assist         General transfer comment: from [facility] using armrests  Ambulation/Gait Ambulation/Gait assistance: Min guard Ambulation Distance (Feet): 12 Feet Assistive device: Rolling walker (2 wheeled) Gait Pattern/deviations: Step-to pattern   Gait velocity interpretation: Below normal speed for age/gender General Gait Details: pt walked with R  boot, steady with RW, distance limited by 3/4 dyspnea, SaO2 93% on RA walking, HR 113, pt remained dyspnic after 4 minutes rest, applied O2  Stairs            Wheelchair Mobility    Modified Rankin (Stroke Patients Only)       Balance Overall balance assessment: Needs assistance   Sitting balance-Leahy Scale: Good       Standing balance-Leahy Scale: Poor Standing balance comment: relies on BUE support                             Pertinent Vitals/Pain Pain Assessment: No/denies pain    Home Living Family/patient expects to be discharged to:: Private residence Living Arrangements: Alone Available Help at Discharge: Family;Available PRN/intermittently Type of Home: House Home Access: Stairs to enter Entrance Stairs-Rails: Left Entrance Stairs-Number of Steps: 3 Home Layout: Two level;Bed/bath upstairs Home Equipment: Walker - 2 wheels;Walker - standard;Tub bench;Wheelchair - manual      Prior Function Level of Independence: Independent with assistive device(s)         Comments: used RW or WC, had R ankle fx 1 month ago, went to SNF, then has been home 1.5 weeks. Family was checking in several times per day.      Hand Dominance   Dominant Hand: Right    Extremity/Trunk Assessment   Upper Extremity Assessment: Overall WFL for tasks assessed           Lower Extremity Assessment: RLE deficits/detail RLE Deficits / Details: edema R foot, pt able to actively PF/DF ankle,  knee ext +4/5, sensation intact to light touch    Cervical / Trunk Assessment: Normal  Communication   Communication: HOH  Cognition Arousal/Alertness: Awake/alert Behavior During Therapy: WFL for tasks assessed/performed Overall Cognitive Status: Within Functional Limits for tasks assessed                      General Comments      Exercises        Assessment/Plan    PT Assessment Patient needs continued PT services  PT Diagnosis Difficulty  walking;Generalized weakness   PT Problem List Decreased activity tolerance;Decreased balance;Decreased mobility;Cardiopulmonary status limiting activity  PT Treatment Interventions Gait training;DME instruction;Functional mobility training;Therapeutic activities;Patient/family education;Therapeutic exercise   PT Goals (Current goals can be found in the Care Plan section) Acute Rehab PT Goals Patient Stated Goal: to go bowling PT Goal Formulation: With patient Time For Goal Achievement: 07/11/15 Potential to Achieve Goals: Good    Frequency Min 3X/week   Barriers to discharge        Co-evaluation               End of Session Equipment Utilized During Treatment: Gait belt Activity Tolerance: Patient limited by fatigue Patient left: in chair;with call bell/phone within reach Nurse Communication: Mobility status         Time: 1410-1438 PT Time Calculation (min) (ACUTE ONLY): 28 min   Charges:   PT Evaluation $Initial PT Evaluation Tier I: 1 Procedure PT Treatments $Gait Training: 8-22 mins   PT G Codes:        Philomena Doheny 06/27/2015, 2:56 PM [phone]

## 2015-06-27 NOTE — Care Management Important Message (Signed)
Important Message  Patient Details IM Letter given to Rhonda/Case Manager to present to [place] Message  Patient Details  Name: [name] MRN: [id number] Date of Birth: [birth year]   Medicare Important Message Given:  Yes-second notification given    Camillo Flaming 06/27/2015, 1:14 PM Name: [name] MRN: [id number] Date of Birth: 28-Mar-1927   Medicare Important Message Given:  Yes-second notification given    Camillo Flaming 06/27/2015, 1:13 PM

## 2015-06-27 NOTE — Progress Notes (Signed)
Date: June 27, 2015 Chart reviewed for concurrent status and case management needs. Will continue to follow patient for changes and needs: Rhonda Davis, RN, BSN, CCM   [phone] 

## 2015-06-27 NOTE — Progress Notes (Addendum)
Patient ID: [name], female   DOB: 1927-05-02, 81 y.o.   MRN: [id number] [facility] PROGRESS NOTE  Roshini Fulwider HTD:[id number] DOB: 1927-04-30 DOA: 06/20/2015 PCP: Loura Pardon, MD  Brief narrative:    81 year old female with past medical history of hypertension, constipation who presented to [facility] [place] ED with multiple episodes of non bloody diarrhea and abdominal cramps for past few days prior to this admission. Patient also reported associated nausea and one episode of vomiting.   On admission, patient was found to have hypoxia of 77% on room air which has improved with nasal cannula oxygen support. T max was 101.5 F, HR 122, RR 18-33. Blood work was notable for WBC count of 22.9 otherwise unremarkable. CXR showed cardiomegaly but no definite pulmonary edema or consolidation. CT abdomen showed findings concerning for colitis. She was started on vanco, zosyn and flagyl for sepsis secondary to possible C. difficile colitis. Sure enough her stool for C. difficile was positive.   Anticipated discharge:  Jul 07, 2015.  Assessment/Plan:    Principal Problem:  Sepsis ([place]) / C. difficile colitis / leukocytosis / Diarrhea of infectious etiology  - Sepsis cirteria met on amdission with fever of 101.5 F, HR 122, RR 18-33, hypoxia of 77%, leukocytosis of 22.9. C. difficile colitis is most likely associated infection. Sepsis workup was initiated at the time of the admission. Lactic acid was within normal limits. Procalcitonin was 4.94. - Please note CT abdomen on the admission concerning for diffuse rectal and distal sigmoid wall thickening suspicious for colitis. In addition, stool was positive for C. difficile toxin and antigen. - Patient initially on vancomycin, Zosyn and Flagyl. Because of persistent leukocytosis we added vancomycin by mouth. - Since no evidence of MRSA infection we stopped vancomycin 06/26/2015. Blood cultures so far show no growth so we will stop Zosyn today. Urine culture  showed no growth. - Continue Flagyl and vancomycin by mouth for now. - Follow-up CBC tomorrow morning.  Active Problems:  Acute respiratory failure with hypoxia (HCC) - Chest x-ray did not demonstrate acute cardiopulmonary findings.  - Stable respiratory status    Benign essential HTN - Continue Cardizem and Aldactone    Acute renal failure - Probably combination of sepsis, GI losses and possibly even a vancomycin IV. We stopped vancomycin 06/26/2015. - Creatinine trending up, 1.91 this morning. - We will continue IV fluids. - Follow-up BMP tomorrow morning.    Hypomagnesemia - Secondary to diarrhea, GI losses - Supplemented 10/13. - Check magnesium level tomorrow morning.    Normocytic anemia - Hemoglobin stable at 11.4. - No current indications for transfusion.  DVT prophylaxis:  - SCD's bilaterally    Code Status: Full.  Family Communication:  plan of care discussed with the patient Disposition Plan: Home likely 07/07/2015.   IV access:  Peripheral IV  Procedures and diagnostic studies:    Ct Abdomen Pelvis W Contrast 07/01/2015 Left colonic diverticulosis. Mild diffuse wall thickening in the mid to distal sigmoid colon and rectum with mucosal enhancement suggesting colitis. Appendix is borderline in diameter, 8-9 mm without surrounding inflammatory change. Doubt clinical significance. Recommend clinical correlation to completely exclude signs and symptoms of appendicitis. Electronically Signed By: Rolm Baptise M.D. On: 07/06/2015 12:01   Dg Chest Portable 1 View 07/14/2015 Cardiomegaly. Chronic elevation of the right hemidiaphragm. No definite infiltrate or pulmonary edema. Bilateral basilar atelectasis. Electronically Signed By: Lahoma Crocker M.D. On: 06/26/2015 11:02   Medical Consultants:  None  Other Consultants:  None  IAnti-Infectives:  Vancomycin IV 07/07/2015 --> 06/26/2015 Zosyn 07/01/2015 --> 06/27/2015  Flagyl 07/11/2015 --> Vanco by  mouth 06/26/2015 -->   Leisa Lenz, MD  [facility] Pager [phone]  Time spent in minutes: 25 minutes  If 7PM-7AM, please contact night-coverage [email or web address] Password [email or web address] 06/27/2015, 10:30 AM   LOS: 2 days    HPI/Subjective: No acute overnight events. Patient reports diarrhea seems to be improving. No blood in the stool.   Filed Vitals:   06/26/15 2225 06/27/15 0630 06/27/15 0800 06/27/15 1000  BP: 103/58 155/58 155/64 141/82  Pulse: 107 99 109 108  Temp: 99.1 F (37.3 C) 98.4 F (36.9 C) 98.1 F (36.7 C) 98.3 F (36.8 C)  TempSrc: Oral Oral Oral Oral  Resp: _0 Height:      Weight:      SpO2: 100% 92% 92% 91%    Intake/Output Summary (Last 24 hours) at 06/27/15 1030 Last data filed at 06/27/15 1022  Gross per 24 hour  Intake    925 ml  Output      0 ml  Net    925 ml    Exam:   General:  Pt is not in acute distress  Cardiovascular: Rate controlled, appreciate S1-S2  Respiratory: Bilateral air entry, no wheezing  Abdomen: Appreciate bowel sounds, nontender abdomen  Extremities: No leg swelling, palpable pulses  Neuro: No focal deficits  Data Reviewed: Basic Metabolic Panel:  Recent Labs Lab 06/30/2015 0955 06/18/2015 1600 06/26/15 0530 06/27/15 0600  NA 134* 133* 135 131*  K 3.5 3.8 3.6 3.8  CL 100* 102 103 102  CO2 24 22 20* 16*  GLUCOSE 139* 119* 202* 156*  BUN _1 32*  CREATININE 0.84 0.88 1.14* 1.91*  CALCIUM 8.8* 8.2* 7.9* 7.6*  MG  --  1.4*  --   --   PHOS  --  3.7  --   --    Liver Function Tests:  Recent Labs Lab 06/16/2015 0955 07/10/2015 1600  AST 28 27  ALT 22 21  ALKPHOS 82 75  BILITOT 0.7 1.0  PROT 7.6 7.1  ALBUMIN 3.6 3.1*   No results for input(s): LIPASE, AMYLASE in the last 168 hours. No results for input(s): AMMONIA in the last 168 hours. CBC:  Recent Labs Lab 06/17/2015 0955 07/03/2015 1600 06/26/15 0530  WBC 22.9* 18.6* 21.3*  NEUTROABS 20.4* 17.3*  --   HGB 12.9 11.8* 11.4*  HCT  38.2 35.5* 35.1*  MCV 91.0 91.0 91.6  PLT 339 317 285   Cardiac Enzymes:  Recent Labs Lab 07/14/2015 0955  TROPONINI <0.03   BNP: Invalid input(s): POCBNP CBG:  Recent Labs Lab 06/26/15 0747 06/27/15 0746  GLUCAP 150* 157*    Urine culture     Status: None (Preliminary result)   Collection Time: 07/06/2015  1:17 PM  Result Value Ref Range Status   Specimen Description URINE, CATHETERIZED  Final   Special Requests NONE  Final   Culture   Final    NO GROWTH < 24 HOURS Performed at [facility]    Report Status PENDING  Incomplete  C difficile quick scan w PCR reflex     Status: Abnormal   Collection Time: 07/12/2015  6:23 PM  Result Value Ref Range Status   C Diff antigen POSITIVE (A) NEGATIVE Final   C Diff toxin POSITIVE (A) NEGATIVE Final   C Diff interpretation Positive for toxigenic C. difficile  Final     Scheduled Meds: . acetaminophen  650 mg Oral TID PC & HS  . cholecalciferol  800 Units Oral Daily  . diltiazem  240 mg Oral Daily  . metronidazole  500 mg Intravenous Q8H  . multivitamin with minerals  1 tablet Oral Daily  . rOPINIRole  1 mg Oral Daily  . saccharomyces boulardii  250 mg Oral BID  . spironolactone  25 mg Oral Daily  . vancomycin  125 mg Oral QID  . vancomycin  1,000 mg Intravenous Once   Continuous Infusions: . sodium chloride 50 mL/hr at 06/26/15 1956

## 2015-06-28 ENCOUNTER — Inpatient Hospital Stay (HOSPITAL_COMMUNITY): Payer: Medicare Other

## 2015-06-28 DIAGNOSIS — A047 Enterocolitis due to Clostridium difficile: Secondary | ICD-10-CM

## 2015-06-28 DIAGNOSIS — N179 Acute kidney failure, unspecified: Secondary | ICD-10-CM | POA: Insufficient documentation

## 2015-06-28 DIAGNOSIS — J9601 Acute respiratory failure with hypoxia: Secondary | ICD-10-CM | POA: Insufficient documentation

## 2015-06-28 DIAGNOSIS — T508X5A Adverse effect of diagnostic agents, initial encounter: Secondary | ICD-10-CM

## 2015-06-28 DIAGNOSIS — R6521 Severe sepsis with septic shock: Secondary | ICD-10-CM

## 2015-06-28 DIAGNOSIS — N141 Nephropathy induced by other drugs, medicaments and biological substances: Secondary | ICD-10-CM

## 2015-06-28 DIAGNOSIS — T508X1A Poisoning by diagnostic agents, accidental (unintentional), initial encounter: Secondary | ICD-10-CM

## 2015-06-28 DIAGNOSIS — A419 Sepsis, unspecified organism: Principal | ICD-10-CM | POA: Insufficient documentation

## 2015-06-28 LAB — CBC
HCT: 42.4 % (ref 36.0–46.0)
HEMATOCRIT: 39.1 % (ref 36.0–46.0)
Hemoglobin: 14.6 g/dL (ref 12.0–15.0)
Hemoglobin: 12.8 g/dL (ref 12.0–15.0)
MCH: 30.6 pg (ref 26.0–34.0)
MCH: 30.8 pg (ref 26.0–34.0)
MCHC: 34.4 g/dL (ref 30.0–36.0)
MCHC: 32.7 g/dL (ref 30.0–36.0)
MCV: 88.9 fL (ref 78.0–100.0)
MCV: 94.2 fL (ref 78.0–100.0)
PLATELETS: 321 10*3/uL (ref 150–400)
PLATELETS: 285 10*3/uL (ref 150–400)
RBC: 4.77 MIL/uL (ref 3.87–5.11)
RBC: 4.15 MIL/uL (ref 3.87–5.11)
RDW: 14.2 % (ref 11.5–15.5)
RDW: 15.1 % (ref 11.5–15.5)
WBC: 35.7 10*3/uL — AB (ref 4.0–10.5)
WBC: 34.8 10*3/uL — AB (ref 4.0–10.5)

## 2015-06-28 LAB — BASIC METABOLIC PANEL
ANION GAP: 16 — AB (ref 5–15)
BUN: 50 mg/dL — ABNORMAL HIGH (ref 6–20)
CALCIUM: 8.8 mg/dL — AB (ref 8.9–10.3)
CO2: 18 mmol/L — ABNORMAL LOW (ref 22–32)
Chloride: 99 mmol/L — ABNORMAL LOW (ref 101–111)
Creatinine, Ser: 2.8 mg/dL — ABNORMAL HIGH (ref 0.44–1.00)
GFR, EST AFRICAN AMERICAN: 17 mL/min — AB (ref >60)
GFR, EST NON AFRICAN AMERICAN: 14 mL/min — AB (ref >60)
Glucose, Bld: 193 mg/dL — ABNORMAL HIGH (ref 65–99)
Potassium: 3.9 mmol/L (ref 3.5–5.1)
SODIUM: 133 mmol/L — AB (ref 135–145)

## 2015-06-28 LAB — BLOOD GAS, ARTERIAL
ACID-BASE DEFICIT: 23.8 mmol/L — AB (ref 0.0–2.0)
Acid-base deficit: 19.6 mmol/L — ABNORMAL HIGH (ref 0.0–2.0)
BICARBONATE: 10.5 meq/L — AB (ref 20.0–24.0)
Bicarbonate: 7.2 mEq/L — ABNORMAL LOW (ref 20.0–24.0)
Drawn by: 441261
Drawn by: 441261
FIO2: 1
LHR: 25 {breaths}/min
O2 CONTENT: 2 L/min
O2 SAT: 98.8 %
O2 Saturation: 97 %
PATIENT TEMPERATURE: 98.6
PATIENT TEMPERATURE: 98.6
PCO2 ART: 28.8 mmHg — AB (ref 35.0–45.0)
PCO2 ART: 39.9 mmHg (ref 35.0–45.0)
PEEP: 5 cmH2O
PH ART: 7.026 — AB (ref 7.350–7.450)
PH ART: 7.05 — AB (ref 7.350–7.450)
PO2 ART: 157 mmHg — AB (ref 80.0–100.0)
TCO2: 7 mmol/L (ref 0–100)
TCO2: 10.4 mmol/L (ref 0–100)
VT: 360 mL
pO2, Arterial: 285 mmHg — ABNORMAL HIGH (ref 80.0–100.0)

## 2015-06-28 LAB — COMPREHENSIVE METABOLIC PANEL
ALBUMIN: 2 g/dL — AB (ref 3.5–5.0)
ALK PHOS: 159 U/L — AB (ref 38–126)
ALT: 147 U/L — AB (ref 14–54)
AST: 228 U/L — AB (ref 15–41)
Anion gap: 18 — ABNORMAL HIGH (ref 5–15)
BILIRUBIN TOTAL: 0.6 mg/dL (ref 0.3–1.2)
BUN: 55 mg/dL — AB (ref 6–20)
CO2: 14 mmol/L — ABNORMAL LOW (ref 22–32)
CREATININE: 3.53 mg/dL — AB (ref 0.44–1.00)
Calcium: 8.4 mg/dL — ABNORMAL LOW (ref 8.9–10.3)
Chloride: 103 mmol/L (ref 101–111)
GFR calc Af Amer: 12 mL/min — ABNORMAL LOW (ref >60)
GFR, EST NON AFRICAN AMERICAN: 11 mL/min — AB (ref >60)
GLUCOSE: 140 mg/dL — AB (ref 65–99)
POTASSIUM: 4.8 mmol/L (ref 3.5–5.1)
Sodium: 135 mmol/L (ref 135–145)
TOTAL PROTEIN: 5.1 g/dL — AB (ref 6.5–8.1)

## 2015-06-28 LAB — GLUCOSE, CAPILLARY
GLUCOSE-CAPILLARY: 199 mg/dL — AB (ref 65–99)
GLUCOSE-CAPILLARY: 135 mg/dL — AB (ref 65–99)
GLUCOSE-CAPILLARY: 151 mg/dL — AB (ref 65–99)
Glucose-Capillary: 189 mg/dL — ABNORMAL HIGH (ref 65–99)

## 2015-06-28 LAB — LACTIC ACID, PLASMA
Lactic Acid, Venous: 8.3 mmol/L (ref 0.5–2.0)
Lactic Acid, Venous: 8.9 mmol/L (ref 0.5–2.0)

## 2015-06-28 LAB — PROTIME-INR
INR: 1.48 (ref 0.00–1.49)
Prothrombin Time: 18 seconds — ABNORMAL HIGH (ref 11.6–15.2)

## 2015-06-28 LAB — MAGNESIUM: MAGNESIUM: 2.7 mg/dL — AB (ref 1.7–2.4)

## 2015-06-28 LAB — VANCOMYCIN, RANDOM: Vancomycin Rm: 25 ug/mL

## 2015-06-28 MED ORDER — SODIUM CHLORIDE 0.9 % IV BOLUS (SEPSIS)
1000.0000 mL | Freq: Once | INTRAVENOUS | Status: AC
  Administered 2015-06-28: 1000 mL via INTRAVENOUS

## 2015-06-28 MED ORDER — SUCCINYLCHOLINE CHLORIDE 20 MG/ML IJ SOLN
INTRAMUSCULAR | Status: AC
  Filled 2015-06-28: qty 1

## 2015-06-28 MED ORDER — FUROSEMIDE 10 MG/ML IJ SOLN
40.0000 mg | Freq: Once | INTRAMUSCULAR | Status: AC
  Administered 2015-06-28: 40 mg via INTRAVENOUS
  Filled 2015-06-28: qty 4

## 2015-06-28 MED ORDER — MIDAZOLAM HCL 2 MG/2ML IJ SOLN
INTRAMUSCULAR | Status: AC
  Filled 2015-06-28: qty 2

## 2015-06-28 MED ORDER — SODIUM BICARBONATE 4.2 % IV SOLN: 100.0000 meq | Freq: Once | INTRAVENOUS | Status: DC

## 2015-06-28 MED ORDER — FENTANYL CITRATE (PF) 100 MCG/2ML IJ SOLN
INTRAMUSCULAR | Status: AC
  Filled 2015-06-28: qty 2

## 2015-06-28 MED ORDER — FENTANYL CITRATE (PF) 100 MCG/2ML IJ SOLN
50.0000 ug | Freq: Once | INTRAMUSCULAR | Status: AC
  Administered 2015-06-28: 50 ug via INTRAVENOUS

## 2015-06-28 MED ORDER — FENTANYL BOLUS VIA INFUSION
25.0000 ug | INTRAVENOUS | Status: DC | PRN
  Filled 2015-06-28: qty 25

## 2015-06-28 MED ORDER — SODIUM CHLORIDE 0.9 % IV BOLUS (SEPSIS)
500.0000 mL | Freq: Once | INTRAVENOUS | Status: AC
  Administered 2015-06-28: 500 mL via INTRAVENOUS

## 2015-06-28 MED ORDER — VANCOMYCIN 50 MG/ML ORAL SOLUTION
500.0000 mg | Freq: Four times a day (QID) | ORAL | Status: DC
  Administered 2015-06-28 – 2015-06-29 (×3): 500 mg via ORAL
  Filled 2015-06-28 (×10): qty 10

## 2015-06-28 MED ORDER — SODIUM BICARBONATE 8.4 % IV SOLN
INTRAVENOUS | Status: DC
  Administered 2015-06-28 – 2015-06-29 (×2): via INTRAVENOUS
  Filled 2015-06-28 (×3): qty 150

## 2015-06-28 MED ORDER — CHLORHEXIDINE GLUCONATE 0.12 % MT SOLN
OROMUCOSAL | Status: AC
  Administered 2015-06-28: 15 mL
  Filled 2015-06-28: qty 15

## 2015-06-28 MED ORDER — FAMOTIDINE IN NACL 20-0.9 MG/50ML-% IV SOLN
20.0000 mg | INTRAVENOUS | Status: DC
  Administered 2015-06-28: 20 mg via INTRAVENOUS
  Filled 2015-06-28: qty 50

## 2015-06-28 MED ORDER — ANTISEPTIC ORAL RINSE SOLUTION (CORINZ)
7.0000 mL | Freq: Four times a day (QID) | OROMUCOSAL | Status: DC
  Administered 2015-06-29 (×2): 7 mL via OROMUCOSAL

## 2015-06-28 MED ORDER — IPRATROPIUM BROMIDE 0.02 % IN SOLN
0.5000 mg | RESPIRATORY_TRACT | Status: DC
Start: 2015-06-28 — End: 2015-06-29
  Administered 2015-06-28 – 2015-06-29 (×7): 0.5 mg via RESPIRATORY_TRACT
  Filled 2015-06-28 (×6): qty 2.5

## 2015-06-28 MED ORDER — ALBUTEROL SULFATE (2.5 MG/3ML) 0.083% IN NEBU: 2.5000 mg | INHALATION_SOLUTION | RESPIRATORY_TRACT | Status: DC | PRN

## 2015-06-28 MED ORDER — MIDAZOLAM HCL 2 MG/2ML IJ SOLN
2.0000 mg | Freq: Once | INTRAMUSCULAR | Status: AC
  Administered 2015-06-28: 2 mg via INTRAVENOUS

## 2015-06-28 MED ORDER — FENTANYL CITRATE (PF) 100 MCG/2ML IJ SOLN: 50.0000 ug | Freq: Once | INTRAMUSCULAR | Status: AC

## 2015-06-28 MED ORDER — SODIUM CHLORIDE 0.9 % IV SOLN
25.0000 ug/h | INTRAVENOUS | Status: DC
  Administered 2015-06-28: 75 ug/h via INTRAVENOUS
  Filled 2015-06-28 (×3): qty 50

## 2015-06-28 MED ORDER — PIPERACILLIN-TAZOBACTAM IN DEX 2-0.25 GM/50ML IV SOLN
2.2500 g | Freq: Three times a day (TID) | INTRAVENOUS | Status: DC
  Administered 2015-06-28 – 2015-06-29 (×2): 2.25 g via INTRAVENOUS
  Filled 2015-06-28 (×5): qty 50

## 2015-06-28 MED ORDER — LIDOCAINE HCL (CARDIAC) 20 MG/ML IV SOLN
INTRAVENOUS | Status: AC
  Filled 2015-06-28: qty 5

## 2015-06-28 MED ORDER — SODIUM BICARBONATE 8.4 % IV SOLN
INTRAVENOUS | Status: AC
  Filled 2015-06-28: qty 100

## 2015-06-28 MED ORDER — NOREPINEPHRINE BITARTRATE 1 MG/ML IV SOLN
2.0000 ug/min | INTRAVENOUS | Status: DC
  Administered 2015-06-28: 5 ug/min via INTRAVENOUS
  Administered 2015-06-29: 25 ug/min via INTRAVENOUS
  Administered 2015-06-29: 23 ug/min via INTRAVENOUS
  Filled 2015-06-28 (×5): qty 4

## 2015-06-28 MED ORDER — ETOMIDATE 2 MG/ML IV SOLN
20.0000 mg | Freq: Once | INTRAVENOUS | Status: AC
  Administered 2015-06-28: 20 mg via INTRAVENOUS
  Filled 2015-06-28: qty 10

## 2015-06-28 MED ORDER — IPRATROPIUM BROMIDE 0.02 % IN SOLN
RESPIRATORY_TRACT | Status: AC
  Administered 2015-06-28: 0.5 mg via RESPIRATORY_TRACT
  Filled 2015-06-28: qty 2.5

## 2015-06-28 MED ORDER — SODIUM BICARBONATE 8.4 % IV SOLN
100.0000 meq | Freq: Once | INTRAVENOUS | Status: AC
  Administered 2015-06-28: 100 meq via INTRAVENOUS

## 2015-06-28 MED ORDER — LEVALBUTEROL HCL 1.25 MG/0.5ML IN NEBU
1.2500 mg | INHALATION_SOLUTION | RESPIRATORY_TRACT | Status: DC
  Administered 2015-06-28 – 2015-06-29 (×7): 1.25 mg via RESPIRATORY_TRACT
  Filled 2015-06-28 (×13): qty 0.5

## 2015-06-28 MED ORDER — DEXTROSE 5 % IV SOLN
0.0000 ug/min | INTRAVENOUS | Status: DC
  Filled 2015-06-28: qty 1

## 2015-06-28 MED ORDER — ETOMIDATE 2 MG/ML IV SOLN
INTRAVENOUS | Status: AC
  Filled 2015-06-28: qty 20

## 2015-06-28 MED ORDER — FLUCONAZOLE IN SODIUM CHLORIDE 200-0.9 MG/100ML-% IV SOLN
200.0000 mg | INTRAVENOUS | Status: DC
  Administered 2015-06-28: 200 mg via INTRAVENOUS
  Filled 2015-06-28 (×2): qty 100

## 2015-06-28 MED ORDER — ROCURONIUM BROMIDE 50 MG/5ML IV SOLN
INTRAVENOUS | Status: AC
  Filled 2015-06-28: qty 2

## 2015-06-28 MED ORDER — INSULIN ASPART 100 UNIT/ML ~~LOC~~ SOLN
0.0000 [IU] | SUBCUTANEOUS | Status: DC
  Administered 2015-06-28: 2 [IU] via SUBCUTANEOUS
  Administered 2015-06-29 (×2): 1 [IU] via SUBCUTANEOUS

## 2015-06-28 MED ORDER — VANCOMYCIN 50 MG/ML ORAL SOLUTION
500.0000 mg | Freq: Once | ORAL | Status: AC
  Administered 2015-06-28: 500 mg via ORAL
  Filled 2015-06-28: qty 10

## 2015-06-28 MED ORDER — CHLORHEXIDINE GLUCONATE 0.12% ORAL RINSE (MEDLINE KIT)
15.0000 mL | Freq: Two times a day (BID) | OROMUCOSAL | Status: DC
  Administered 2015-06-28: 15 mL via OROMUCOSAL

## 2015-06-28 NOTE — Progress Notes (Signed)
0830. Pt. With audible wheezing noted, breath sounds with inspiratory and expiratory wheezes noted. Resp =40, O2 sat 95% on 2 liters. Shortness of breath. HOB elevated.  MD notified, new orders received. RT notified for treatment.

## 2015-06-28 NOTE — Progress Notes (Signed)
Left arm has mastectomy over 18 years ago. Per Dr. Melvyn Novas, this arm is acceptable for IV access.

## 2015-06-28 NOTE — Procedures (Signed)
Central Venous Catheter Insertion Procedure Note Yamina Lenis [id number] [birth year]  Procedure: Insertion of Central Venous Catheter Indications: Assessment of intravascular volume  Procedure Details Consent: Risks of procedure as well as the alternatives and risks of each were explained to the (patient/caregiver).  Consent for procedure obtained. Time Out: Verified patient identification, verified procedure, site/side was marked, verified correct patient position, special equipment/implants available, medications/allergies/relevent history reviewed, required imaging and test results available.  Performed  Maximum sterile technique was used including antiseptics, gloves, gown, hand hygiene, mask and sheet. Skin prep: Chlorhexidine; local anesthetic administered A antimicrobial bonded/coated triple lumen catheter was placed in the left internal jugular vein using the Seldinger technique.  Ultrasound was used to verify the patency of the vein and for real time needle guidance.  Evaluation Blood flow good Complications: No apparent complications Patient did tolerate procedure well. Chest X-ray ordered to verify placement.  CXR: pending.  MCQUAID, DOUGLAS 06/28/2015, 3:46 PM

## 2015-06-28 NOTE — Progress Notes (Signed)
CXray was preformed after ET tube was pulled back to 23cm. Elink was notified once Chest XRay was preformed and MD verified that tube placement was correct. No other intervention was preformed.

## 2015-06-28 NOTE — Consult Note (Signed)
PULMONARY / CRITICAL CARE MEDICINE   Name: [name] MRN: [id number] DOB: 04-13-1927    ADMISSION DATE:  06/28/2015 CONSULTATION DATE:  06/28/2015  REFERRING MD :  Charlies Silvers, [facility]  CHIEF COMPLAINT:  Abdominal pain, shortness of breath  INITIAL PRESENTATION: 81 year old female with a history of hypertension, diabetes, and diverticulosis was admitted on 07/10/2015 with C. difficile. On 06/28/2015 she developed septic shock, metabolic acidosis, and acute respiratory failure with hypoxemia requiring intubation and pulmonary and critical care medicine was consulted.  STUDIES:  07/13/2015 CT abdomen and pelvis large diverticulosis, mid to descending colon colitis, mildly enlarged appendix 06/28/2015 CT abdomen and pelvis>>  SIGNIFICANT EVENTS: 06/19/2015 admitted 06/28/2015 intubated for septic shock   HISTORY OF PRESENT ILLNESS:  This is an 81 year old female who is nearly obtunded on my arrival to the intensive care unit and unable to provide adequate history because of severe respiratory failure. Chart review I see that she was admitted on 06/23/2015 because of belly cramping, nausea, and nonbloody diarrhea for several days. She was diagnosed with C. difficile colitis and admitted to the hospitalist service. On admission she was febrile and mildly hypoxemic requiring nasal cannula oxygen. During her hospital stay her white blood cell count did increase and on October 15 her white count was 35,000. On that same day she developed worsening abdominal distention and shortness of breath. She was treated initially with Lasix but a chest x-ray showed no acute abnormality. She was brought to the intensive care unit because of worsening respiratory failure and was found to have severe metabolic acidosis. Pulmonary and critical care medicine was consulted and we discussed the situation briefly with the patient as well as with her family. The patient stated that she would be willing  to go on life support temporarily and her family supported this decision.   PAST MEDICAL HISTORY :   has a past medical history of HTN (hypertension); GERD (gastroesophageal reflux disease); Hyperglycemia; Allergic rhinitis; Chronic cough; Compression fracture; Diverticulosis; Hemorrhoids; Osteoporosis; Hematuria; Family history of adverse reaction to anesthesia; Endometrial cancer ([place]); Uterine cancer ([place]); Breast cancer, left breast ([place]) (12/99); Basal cell carcinoma of buttock; Diabetes mellitus; Arthritis; and Urinary incontinence.  has past surgical history that includes Total abdominal hysterectomy w/ bilateral salpingoophorectomy; Mastectomy (Left); Excision basal cell carcinoma (10/08); Cataract extraction w/ intraocular lens  implant, bilateral (~ 2010-2012); DEXA (2/00); DEXA (2/02); DEXA (3/04); Colonoscopy (3/02); Dilation and curettage of uterus; Shoulder surgery (Right); Carpal tunnel release (Right); Laparoscopic cholecystectomy (2009); Total abdominal hysterectomy; Back surgery (2007); and ORIF ankle fracture (Right, 05/02/2015). Prior to Admission medications   Medication Sig Start Date End Date Taking? Authorizing Provider  acetaminophen (TYLENOL) 650 MG CR tablet Take 1,300 mg by mouth 2 (two) times daily.     Yes Historical Provider, MD  CARTIA XT 240 MG 24 hr capsule TAKE 1 CAPSULE EVERY DAY 10/23/14  Yes Abner Greenspan, MD  cephALEXin (KEFLEX) 500 MG capsule Take 500 mg by mouth 4 (four) times daily. Started 10/03 06/16/15  Yes Historical Provider, MD  chlorpheniramine (CHLOR-TRIMETON) 4 MG tablet Take 4 mg by mouth 2 (two) times daily as needed for allergies.   Yes Historical Provider, MD  Cholecalciferol (VITAMIN D PO) Take 800 Units by mouth daily.    Yes Historical Provider, MD  docusate sodium (COLACE) 100 MG capsule Take 100 mg by mouth daily as needed for mild constipation.    Yes Historical Provider, MD  Multiple Vitamin (MULITIVITAMIN WITH MINERALS) TABS Take 1 tablet by  mouth daily.   Yes Historical Provider, MD  omeprazole (PRILOSEC) 40 MG capsule Take one capsule by mouth twice daily for stomach 06/05/15  Yes Mahima Bubba Camp, MD  ondansetron (ZOFRAN) 4 MG tablet Take 4 mg by mouth every 8 (eight) hours as needed for nausea or vomiting.   Yes Historical Provider, MD  rOPINIRole (REQUIP) 1 MG tablet Take one tablet by mouth once daily for restless legs 06/05/15  Yes Mahima Pandey, MD  senna-docusate (SENOKOT-S) 8.6-50 MG per tablet Take 2 tablets by mouth 2 (two) times daily. For constipation 06/05/15  Yes Mahima Bubba Camp, MD  spironolactone (ALDACTONE) 25 MG tablet Take one tablet by mouth once daily for hypertension 06/05/15  Yes Mahima Bubba Camp, MD  traMADol (ULTRAM) 50 MG tablet Take 1 tablet (50 mg total) by mouth every 6 (six) hours as needed for severe pain. Patient taking differently: Take 50 mg by mouth every 6 (six) hours as needed for moderate pain or severe pain.  05/07/15  Yes Gildardo Cranker, DO   Allergies  Allergen Reactions  . Amoxicillin-Pot Clavulanate Nausea Only  . Clarithromycin Nausea Only  . Codeine Nausea Only  . Sulfonamide Derivatives Nausea Only  . Tetracycline Nausea Only    FAMILY HISTORY:  indicated that her other is alive.  SOCIAL HISTORY:  reports that she has never smoked. She has never used smokeless tobacco. She reports that she does not drink alcohol or use illicit drugs.  REVIEW OF SYSTEMS:  Unable to obtain due to severe respiratory distress  SUBJECTIVE:   VITAL SIGNS: Temp:  [97.6 F (36.4 C)-98.1 F (36.7 C)] 97.6 F (36.4 C) (10/15 0517) Pulse Rate:  [75-115] 115 (10/15 1434) Resp:  [18-40] 37 (10/15 1434) BP: (142-156)/(51-65) 142/51 mmHg (10/15 1226) SpO2:  [93 %-99 %] 99 % (10/15 1241) FiO2 (%):  [100 %] 100 % (10/15 1535) HEMODYNAMICS:   VENTILATOR SETTINGS: Vent Mode:  [-] PRVC FiO2 (%):  [100 %] 100 % Set Rate:  [25 bmp] 25 bmp Vt Set:  [360 mL] 360 mL PEEP:  [5 cmH20] 5 cmH20 Plateau Pressure:  [39  cmH20] 39 cmH20 INTAKE / OUTPUT:  Intake/Output Summary (Last 24 hours) at 06/28/15 1547 Last data filed at 06/27/15 1923  Gross per 24 hour  Intake    240 ml  Output      0 ml  Net    240 ml    PHYSICAL EXAMINATION: General:  Chronically ill-appearing female in acute respiratory distress, diaphoretic Neuro:  Somnolent but arouses to touch, able to answer simple questions, moves all 4 extremities HEENT:  Normocephalic atraumatic, oropharynx clear mucous membranes profoundly dry Cardiovascular:  Tachycardic, no clear murmur, no JVD Lungs:  Tachypnea, accessory muscle use, clear to auscultation, no wheezing Abdomen:  Markedly distended, mildly tender, minimal bowel sounds Musculoskeletal:  Normal bulk and tone Skin:  No rash or skin breakdown  LABS:  CBC  Recent Labs Lab 06/24/2015 1600 06/26/15 0530 06/28/15 0620  WBC 18.6* 21.3* 35.7*  HGB 11.8* 11.4* 14.6  HCT 35.5* 35.1* 42.4  PLT 317 285 321   Coag's  Recent Labs Lab 06/27/2015 1600  APTT 30  INR 1.15   BMET  Recent Labs Lab 06/26/15 0530 06/27/15 0600 06/28/15 0620  NA 135 131* 133*  K 3.6 3.8 3.9  CL 103 102 99*  CO2 20* 16* 18*  BUN 17 32* 50*  CREATININE 1.14* 1.91* 2.80*  GLUCOSE 202* 156* 193*   Electrolytes  Recent Labs Lab 07/01/2015 1600 06/26/15 0530 06/27/15 0600  06/28/15 0620  CALCIUM 8.2* 7.9* 7.6* 8.8*  MG 1.4*  --   --  2.7*  PHOS 3.7  --   --   --    Sepsis Markers  Recent Labs Lab 06/24/2015 1334 06/24/2015 1344 07/07/2015 1600  LATICACIDVEN 0.8 0.65 0.8  PROCALCITON 4.94  --   --    ABG  Recent Labs Lab 06/28/15 1430  PHART 7.026*  PCO2ART 28.8*  PO2ART 157*   Liver Enzymes  Recent Labs Lab 06/19/2015 0955 07/03/2015 1600  AST 28 27  ALT 22 21  ALKPHOS 82 75  BILITOT 0.7 1.0  ALBUMIN 3.6 3.1*   Cardiac Enzymes  Recent Labs Lab 06/17/2015 0955  TROPONINI <0.03   Glucose  Recent Labs Lab 06/26/15 0747 06/27/15 0746 06/28/15 0729  GLUCAP 150* 157* 199*     Imaging Dg Chest Port 1 View  06/28/2015  CLINICAL DATA:  Shortness of breath, lethargy, diarrhea EXAM: PORTABLE CHEST 1 VIEW COMPARISON:  06/16/2015 FINDINGS: Elevation of the right hemidiaphragm. Associated right lower lobe opacity, likely compressive atelectasis. Left lung is clear. No pleural effusion or pneumothorax. The heart is normal in size. Surgical clips along the left lateral chest wall. IMPRESSION: Elevation of the right hemidiaphragm. Associated right lower lobe opacity, likely compressive atelectasis. Electronically Signed   By: Julian Hy M.D.   On: 06/28/2015 10:55     ASSESSMENT / PLAN:  PULMONARY OETT 06/28/2015 A: Acute respiratory failure with hypoxemia in the setting of worsening abdominal distention, no clear pulmonary parenchymal abnormality at this time P:   Mechanical ventilatory support High respiratory rate considering metabolic acidosis Spontaneous breathing trial in the morning Ventilator associated pneumonia precautions  CARDIOVASCULAR CVL left internal jugular 06/28/2015 EKG 06/28/15 > Sinus tach, RBBB A: Septic shock due to C. difficile colitis and presumably perforated abdominal viscus Appears volume depleted Sinus tachycardia, presumably due to sepsis P:  Hold diltiazem Place central line Measure CVP Administer fluids > bolus 2.5L now (30cc/kg) Vasopressors as needed to maintain mean arterial pressure greater than 65 12-lead EKG now  RENAL A:  Acute kidney injury with worsening metabolic acidosis due to sepsis  P : Check lactic acid Volume resuscitate Monitor urine output as best as possible  GASTROINTESTINAL A:   [place] abdominal distention in the setting of C. difficile, worrisome for perforated colon P:   Stat CT abdomen NG tube for decompression Surgery notified, may need exploratory laparotomy today  HEMATOLOGIC A:   No acute issues P:   Monitor for bleeding That PT/INR in case of surgery  INFECTIOUS A:   Septic  shock in the setting of C. difficile colitis, ? Perforated colon? P:   BCx2 10/12 > UC 10/12 > neg  BC x2 10/15 >   Flagyl 10/12 > Oral Vanc 10/12 > IV Vanc 10/12 > IV Zosyn 10/12, 10/13, restart 10/15 > IV fluconazole 06/28/2015>  ENDOCRINE A:  Hyperglycemia  P:   Sliding scale insulin  NEUROLOGIC A:  Mild encephalopathy in setting of sepsis Sedation need for vent synchrony P:   RASS goal: -1 Fentanyl gtt   FAMILY  - Updates: Sister, nieces updated bedside by me   TODAY'S SUMMARY:  admitted to the ICU today with worsening septic shock and respiratory failure in the setting of C. difficile. Abdominal exam is worrisome for an abdominal process. Will broaden antibiotic coverage to include antifungal coverage add back IV vancomycin, get a stat CT abdomen. Her prognosis is guarded at best. Her advanced age and multiorgan failure portend  a poor prognosis. I explained all this to her family at length and they stated that they are willing to pursue short-term full aggressive care. She remains full code for now.  Critical care time outside of procedure 60 minutes   Roselie Awkward, MD [facility] Pager: [phone] Cell: [phone] After 3pm or if no response, call [phone]   06/28/2015, 3:47 PM

## 2015-06-28 NOTE — Progress Notes (Addendum)
ANTIBIOTIC CONSULT NOTE - Follow-Up  Pharmacy Consult for Zosyn Indication: rule out sepsis, intra-abdominal infection  Allergies  Allergen Reactions  . Amoxicillin-Pot Clavulanate Nausea Only  . Clarithromycin Nausea Only  . Codeine Nausea Only  . Sulfonamide Derivatives Nausea Only  . Tetracycline Nausea Only    Patient Measurements: Height: 5' (152.4 cm) Weight: 160 lb (72.576 kg) IBW/kg (Calculated) : 45.5  Vital Signs: Temp: 97.6 F (36.4 C) (10/15 0517) Temp Source: Oral (10/15 0517) BP: 142/51 mmHg (10/15 1226) Pulse Rate: 115 (10/15 1434) Intake/Output from previous day: 10/14 0701 - 10/15 0700 In: 480 [P.O.:480] Out: -  Intake/Output from this shift:    Labs:  Recent Labs  07/07/2015 1600 06/26/15 0530 06/27/15 0600 06/28/15 0620  WBC 18.6* 21.3*  --  35.7*  HGB 11.8* 11.4*  --  14.6  PLT 317 285  --  321  CREATININE 0.88 1.14* 1.91* 2.80*   Estimated Creatinine Clearance: 12.8 mL/min (by C-G formula based on Cr of 2.8).  Recent Labs  06/27/15 0918  [facility] 12     Microbiology: Recent Results (from the past 720 hour(s))  Blood Culture (routine x 2)     Status: None (Preliminary result)   Collection Time: 06/18/2015  9:55 AM  Result Value Ref Range Status   Specimen Description BLOOD LEFT HAND  Final   Special Requests BOTTLES DRAWN AEROBIC AND ANAEROBIC 5ML  Final   Culture   Final    NO GROWTH 3 DAYS Performed at [facility]    Report Status PENDING  Incomplete  Blood Culture (routine x 2)     Status: None (Preliminary result)   Collection Time: 07/12/2015 10:43 AM  Result Value Ref Range Status   Specimen Description BLOOD RIGHT HAND  Final   Special Requests BOTTLES DRAWN AEROBIC ONLY 5ML  Final   Culture   Final    NO GROWTH 3 DAYS Performed at [facility]    Report Status PENDING  Incomplete  Urine culture     Status: None   Collection Time: 06/18/2015  1:17 PM  Result Value Ref Range Status   Specimen Description  URINE, CATHETERIZED  Final   Special Requests NONE  Final   Culture   Final    NO GROWTH 1 DAY Performed at [facility]    Report Status 06/26/2015 FINAL  Final  C difficile quick scan w PCR reflex     Status: Abnormal   Collection Time: 06/17/2015  6:23 PM  Result Value Ref Range Status   C Diff antigen POSITIVE (A) NEGATIVE Final   C Diff toxin POSITIVE (A) NEGATIVE Final    Comment: CRITICAL RESULT CALLED TO, READ BACK BY AND VERIFIED WITH: TOM RYAN RN 10.12.16 @ 1950 BY RICEJ    C Diff interpretation Positive for toxigenic C. difficile  Final    Comment: CRITICAL RESULT CALLED TO, READ BACK BY AND VERIFIED WITH: TOM RYAN RN 10.12.16 @ 1950 BY RICEJ    Assessment: 81 yo female presented to [facility] ED on 10/12 with weakness and diarrhea. She is s/p ORIF ankle fracture 05/02/2015 and was placed on antibiotics ~2 weeks ago for cellulitis around the surgical site.  Code sepsis was called 09:49 and pharmacy consulted to dose Vancomycin and Zosyn. Pt has positive C. diff antigen for which PO vancomycin and IV Flagyl have been started. Per MD note, will continue both due to elevated temp and worsening leukocytosis.   10/15: Developed SOB, transferred to ICU and resumed  Zosyn for possible intra-abdominal infection. Afebrile, WBC rising (35k), SCr increasing to 2.8 with CrCl ~ 13 ml/min.  10/12 >> Vanc IV >> 10/14 10/12 >> Zosyn >> 10/14 10/12 >> Flagyl >> 10/15 10/12 >> Vanc PO >> 10/15 >> Zosyn >>  10/12 blood x 2: NGTD 10/12 urine: NGF 10/12 C.diff: antigen + toxin +  Goal of Therapy:  Appropriate antibiotic dosing for renal function and indication Eradication of infection  Plan:  Start Zosyn 2.25g IV q8h.  Follow up renal function, cultures, de-escalation of therapy, clinical course.  Romeo Rabon, PharmD, pager [phone]. 06/28/2015,3:04 PM.  Addendum: Pharmacy is asked to start fluconazole and resume Vancomycin IV for sepsis. Start fluconazole 200mg  IV q24h. Vanc 1g  was given just over 24hrs ago and clearance is probably poor. We will check a random Vanc level and order doses based on the result.   Romeo Rabon, PharmD, pager [phone]. 06/28/2015,4:13 PM.

## 2015-06-28 NOTE — Progress Notes (Signed)
Patient ID: [name], female   DOB: 09/28/1926, 81 y.o.   MRN: [id number] [facility] PRO[facility]ESS NOTE  [name] WCH:[id number] DOB: [birth year] DOA: 06/27/2015 PCP: Loura Pardon, MD  Brief narrative:    81 year old female with past medical history of hypertension, constipation who presented to [facility] ED with multiple episodes of non bloody diarrhea and abdominal cramps for past few days prior to this admission. Patient also reported associated nausea and one episode of vomiting.   On admission, patient was found to have hypoxia of 77% on room air which has improved with nasal cannula oxygen support. T max was 101.5 F, HR 122, RR 18-33. Blood work was notable for WBC count of 22.9 otherwise unremarkable. CXR showed cardiomegaly but no definite pulmonary edema or consolidation. CT abdomen showed findings concerning for colitis. She was started on vanco, zosyn and flagyl for sepsis secondary to possible C. difficile colitis. Sure enough her stool for C. difficile was positive.   Barrier to discharge:  Wheezing this am so will get CXR, lasix 40 mg IV and nebulizer treatment. Also, still with about 6 episodes of diarrhea in past 24 hours.   Assessment/Plan:    Principal Problem:  Sepsis ([place]) / C. difficile colitis / leukocytosis / Diarrhea of infectious etiology  - Sepsis cirteria met on amdission (fever of 101.5 F, HR 122, RR 18-33, hypoxia of 77%, leukocytosis of 22.9). C. difficile colitis suspected source of infection. CT abdomen on the admission concerning for diffuse rectal and distal sigmoid wall thickening suspicious for colitis. In addition, stool was positive for C. difficile toxin and antigen. - We initiated sepsis workup at the time of the admission. Lactic acid was within normal limits. Procalcitonin was mildly elevated at 4.94. - Started on vanco, flagyl and zosyn initially. Started vanco PO 10/13. Stopped vanco IV 10/13 and zosyn 10/14. - Blood cultures so far show no  growth. Urine culture showed no growth. - WBC count up today to 35.7. I spoke with Dr. Baxter Flattery of ID, she recommended increasing vanco to 500 mg QID and stopping falgyl. Will let them know if no improvement and will call official consult then. For now will monitor with this new change in abx regimen.   Active Problems:  Acute respiratory failure with hypoxia (HCC) - Chest x-ray on admission did not demonstrate acute cardiopulmonary findings. She is wheezing this am so we will obtain CXR this am. - Give once dose of lasix 40 mg IV - Start xopenex and atrovent every 4 hours scheduled.    Benign essential HTN - Continue Cardizem and Aldactone    Acute renal failure / Contrast induced nephropathy  - Probably combination of sepsis, GI losses, vancomycin IV. We stopped vancomycin 06/26/2015. She also has gotten contrast on admission for CT scan which could cause contrast induced nephropathy. - Obtain renal US - We stopped IV fluids because of worsening shortness of breath. - Monitor renal function, it is trending up (1.9 --> 2.8) at this time but hope it will eventually reach plateau    Hypomagnesemia - Secondary to diarrhea, GI losses - Supplemented 10/13.    Normocytic anemia - Hemoglobin stable  - No current indications for transfusion.  DVT prophylaxis:  - SCD's bilaterally in hospital   Code Status: Full.  Family Communication:  plan of care discussed with the patient Disposition Plan: Home likely by 06/30/2015 if diarrhea better and if respiratory status better.   IV access:  Peripheral IV  Procedures and diagnostic  studies:    Ct Abdomen Pelvis W Contrast 06/18/2015 Left colonic diverticulosis. Mild diffuse wall thickening in the mid to distal sigmoid colon and rectum with mucosal enhancement suggesting colitis. Appendix is borderline in diameter, 8-9 mm without surrounding inflammatory change. Doubt clinical significance. Recommend clinical correlation to completely exclude  signs and symptoms of appendicitis. Electronically Signed By: Rolm Baptise M.D. On: 06/30/2015 12:01   Dg Chest Portable 1 View 07/13/2015 Cardiomegaly. Chronic elevation of the right hemidiaphragm. No definite infiltrate or pulmonary edema. Bilateral basilar atelectasis. Electronically Signed By: Lahoma Crocker M.D. On: 06/18/2015 11:02   Medical Consultants:  Infectious disease   Other Consultants:  None  IAnti-Infectives:   Vancomycin IV 07/01/2015 --> 06/26/2015 Zosyn 06/14/2015 --> 06/27/2015  Flagyl 07/14/2015 --> Vanco by mouth 06/26/2015 -->   Leisa Lenz, MD  [facility] Pager [phone]  Time spent in minutes: 25 minutes  If 7PM-7AM, please contact night-coverage [email or web address] Password [email or web address] 06/28/2015, 8:29 AM   LOS: 3 days    HPI/Subjective: No acute overnight events. Patient has wheezing this am.    Filed Vitals:   06/27/15 1000 06/27/15 1328 06/27/15 2108 06/28/15 0517  BP: 141/82 155/69 156/65 154/64  Pulse: 108 109 106 107  Temp: 98.3 F (36.8 C) 97.8 F (36.6 C) 98.1 F (36.7 C) 97.6 F (36.4 C)  TempSrc: Oral Oral Oral Oral  Resp: _0 Height:      Weight:      SpO2: 91% 95% 93% 93%    Intake/Output Summary (Last 24 hours) at 06/28/15 0829 Last data filed at 06/27/15 1923  Gross per 24 hour  Intake    480 ml  Output      0 ml  Net    480 ml    Exam:   General:  Pt is awake, alert  Cardiovascular: tachycardic, appreciate S1, S2   Respiratory: wheezing in upper lung lobes, no rhonchi   Abdomen: (+) BS, non tender abdomen   Extremities: No LE edema, appreciate bilateral pulses  Neuro: Non focal   Data Reviewed: Basic Metabolic Panel:  Recent Labs Lab 07/01/2015 0955 07/08/2015 1600 06/26/15 0530 06/27/15 0600 06/28/15 0620  NA 134* 133* 135 131* 133*  K 3.5 3.8 3.6 3.8 3.9  CL 100* 102 103 102 99*  CO2 24 22 20* 16* 18*  GLUCOSE 139* 119* 202* 156* 193*  BUN _1 32* 50*  CREATININE 0.84 0.88  1.14* 1.91* 2.80*  CALCIUM 8.8* 8.2* 7.9* 7.6* 8.8*  MG  --  1.4*  --   --  2.7*  PHOS  --  3.7  --   --   --    Liver Function Tests:  Recent Labs Lab 06/20/2015 0955 06/16/2015 1600  AST 28 27  ALT 22 21  ALKPHOS 82 75  BILITOT 0.7 1.0  PROT 7.6 7.1  ALBUMIN 3.6 3.1*   No results for input(s): LIPASE, AMYLASE in the last 168 hours. No results for input(s): AMMONIA in the last 168 hours. CBC:  Recent Labs Lab 07/12/2015 0955 07/12/2015 1600 06/26/15 0530 06/28/15 0620  WBC 22.9* 18.6* 21.3* 35.7*  NEUTROABS 20.4* 17.3*  --   --   HGB 12.9 11.8* 11.4* 14.6  HCT 38.2 35.5* 35.1* 42.4  MCV 91.0 91.0 91.6 88.9  PLT 339 317 285 321   Cardiac Enzymes:  Recent Labs Lab 07/11/2015 0955  TROPONINI <0.03   BNP: Invalid input(s): POCBNP CBG:  Recent Labs Lab 06/26/15 0747 06/27/15 0746 06/28/15 0729  GLUCAP 150* 157* 199*    Urine culture     Status: None (Preliminary result)   Collection Time: 07/06/2015  1:17 PM  Result Value Ref Range Status   Specimen Description URINE, CATHETERIZED  Final   Special Requests NONE  Final   Culture   Final    NO GROWTH < 24 HOURS Performed at [facility]    Report Status PENDING  Incomplete  C difficile quick scan w PCR reflex     Status: Abnormal   Collection Time: 06/14/2015  6:23 PM  Result Value Ref Range Status   C Diff antigen POSITIVE (A) NEGATIVE Final   C Diff toxin POSITIVE (A) NEGATIVE Final   C Diff interpretation Positive for toxigenic C. difficile  Final     Scheduled Meds: . acetaminophen  650 mg Oral TID PC & HS  . cholecalciferol  800 Units Oral Daily  . diltiazem  240 mg Oral Daily  . furosemide  40 mg Intravenous Once  . ipratropium  0.5 mg Nebulization Q4H  . levalbuterol  1.25 mg Nebulization 6 times per day  . metronidazole  500 mg Intravenous Q8H  . multivitamin with minerals  1 tablet Oral Daily  . rOPINIRole  1 mg Oral Daily  . saccharomyces boulardii  250 mg Oral BID  . spironolactone  25  mg Oral Daily  . vancomycin  125 mg Oral QID

## 2015-06-28 NOTE — Progress Notes (Signed)
Pt. Continues with shortness of breath. Wheezing with some noted improvement, but pt. not able to catch her breath. RT in for treatment. [facility] nurse here for observation. MD notified, new orders received. Report called to [place] and pt. transferred to stepdown ICU via bed.

## 2015-06-28 NOTE — Progress Notes (Signed)
RT came to bedside to perform routine ventilator check, pull back ETT, and draw ABG. Patient ETT was found at 26 cm at the lip and ETT was pulled back 3 cm per Dr. Anastasia Pall order. RN also at bedside and notified of ETT placement. RN to order repeat CXR to verify current placement.

## 2015-06-28 NOTE — Progress Notes (Signed)
Notified by RN that pt still wheezing and in respiratory distress despite treatment with Xopenex, Atrovent, Lasix 40 mg IV. CXR requested, demonstrated atelectasis. Pt on vanc oral 500 Q6 hours per ID recommendations. Transfer to SDU. PCCM consulted. Keep on BiPAP, low threshold for intubation. Order stat ABG.   Also concern for perforation in the setting for C. Diff, stat KUB requested. Unable to do CT abd or angiogram as pt is with renal failure to unstable to travel off unit. Appreciate everyone's help in caring for the patient.   Leisa Lenz [phone]

## 2015-06-28 NOTE — Progress Notes (Signed)
CRITICAL VALUE ALERT  Critical value received:  ABG pH: 7.05 CO2: 39.9 HCO3: 10.5 02: 285  Date of notification:  06/28/15  Time of notification:  1700  Critical value read back:Yes.  \  Nurse who received alert: Javier Glazier RN   MD notified (1st page):  Dr. Melvyn Novas  Time of first page:  48  MD notified (2nd page):  Time of second page:  Responding MD:  Dr. Melvyn Novas Time: 1700

## 2015-06-28 NOTE — Progress Notes (Signed)
eLink Physician-Brief Progress Note Patient Name: [name] DOB: 03-02-1927 MRN: [id number]   Date of Service  06/28/2015  HPI/Events of Note  Severe met acidois  eICU Interventions  Start naco3 drip     Intervention Category Major Interventions: Acid-Base disturbance - evaluation and management  Christinia Gully 06/28/2015, 5:45 PM

## 2015-06-28 NOTE — Procedures (Signed)
Intubation Procedure Note Aamilah Augenstein [id number] 07-Aug-1927  Procedure: Intubation Indications: Airway protection and maintenance  Procedure Details Consent: Risks of procedure as well as the alternatives and risks of each were explained to the (patient/caregiver).  Consent for procedure obtained. Time Out: Verified patient identification, verified procedure, site/side was marked, verified correct patient position, special equipment/implants available, medications/allergies/relevent history reviewed, required imaging and test results available.  Performed  Drugs Etomidate 20mg , Versed 2mg , Fentanyl 70mcg IV DL x 1 with GS 3 blade Grade 1 view 7.5 ET tube passed through cords under direct visualization Placement confirmed with bilateral breath sounds, positive EtCO2 change and smoke in tube   Evaluation Hemodynamic Status: BP stable throughout; O2 sats: stable throughout Patient's Current Condition: stable Complications: No apparent complications Patient did tolerate procedure well. Chest X-ray ordered to verify placement.  CXR: pending.   MCQUAID, DOUGLAS 06/28/2015

## 2015-06-28 NOTE — Progress Notes (Signed)
Rt placed pt on BIPAP in ICU.

## 2015-06-28 NOTE — Progress Notes (Signed)
BRIEF ANTIBIOTIC CONSULT NOTE - FOLLOW UP  Pharmacy Consult for Vancomycin Indication: rule out sepsis  Estimated Creatinine Clearance: 10.2 mL/min (by C-G formula based on Cr of 3.53).  Recent Labs  06/27/15 0918 06/28/15 1730  VANCORANDOM 12 25     Assessment: 81 yo female presented to [facility] ED on 10/12 with weakness and diarrhea. She is s/p ORIF ankle fracture 05/02/2015 and was placed on antibiotics ~2 weeks ago for cellulitis around the surgical site. Pharmacy was consulted to dose Vancomycin and Zosyn for suspected sepsis, but were later d/c. Pt has positive C. diff antigen and is on PO vancomycin.  On 10/15 she developed SOB, transferred to ICU and resumed broad spectrum antibiotics for possible intra-abdominal infection.   10/12 >> Vanc IV >> 10/14 10/12 >> Zosyn >> 10/14 10/12 >> Flagyl >> 10/15 10/12 >> Vanc PO >> 10/15 >> Zosyn >> 10/15 >> Vanc >> 10/15 >> Fluconazole >>  Today, 06/28/2015:  SCr quickly and progressively increasing to 3.5 with CrCl ~ 10 ml/min.  Vancomycin random level = 25, supratherapeutic after her last dose on 10/14 at ~1100  Goal of Therapy:  Vancomycin trough level 15-20 mcg/ml  Plan:   Hold vancomycin doses until levels < 20  Recheck vancomycin random level with AM labs.  Follow up renal fxn, culture results, and clinical course.  Gretta Arab PharmD, BCPS Pager [phone] 06/28/2015 6:56 PM

## 2015-06-28 NOTE — Progress Notes (Signed)
Report from floor nurse stated pt had received lasix today but has only urinated once since 0700 today. Since admission to the unit, patient has had no UOP. Tried to place foley x 3 RNs, however, no urine return so foley was not kept in place. Night shift RN aware, MD aware, with plans to follow up. Pt was not bladder scanned, however, per MD CT showed no urine in the bladder.

## 2015-06-29 ENCOUNTER — Inpatient Hospital Stay (HOSPITAL_COMMUNITY): Payer: Medicare Other

## 2015-06-29 DIAGNOSIS — K529 Noninfective gastroenteritis and colitis, unspecified: Secondary | ICD-10-CM

## 2015-06-29 LAB — BLOOD GAS, ARTERIAL
ACID-BASE DEFICIT: 15.5 mmol/L — AB (ref 0.0–2.0)
Acid-base deficit: 17.6 mmol/L — ABNORMAL HIGH (ref 0.0–2.0)
BICARBONATE: 11.1 meq/L — AB (ref 20.0–24.0)
BICARBONATE: 12.9 meq/L — AB (ref 20.0–24.0)
DRAWN BY: 232811
Drawn by: 308601
FIO2: 0.4
FIO2: 0.5
LHR: 30 {breaths}/min
MECHVT: 360 mL
O2 SAT: 94.3 %
O2 SAT: 97 %
PATIENT TEMPERATURE: 37.7
PCO2 ART: 41.7 mmHg (ref 35.0–45.0)
PEEP/CPAP: 5 cmH2O
PEEP: 5 cmH2O
PH ART: 7.107 — AB (ref 7.350–7.450)
PH ART: 7.124 — AB (ref 7.350–7.450)
PO2 ART: 94.2 mmHg (ref 80.0–100.0)
Patient temperature: 37.7
RATE: 25 resp/min
TCO2: 10.6 mmol/L (ref 0–100)
TCO2: 12.4 mmol/L (ref 0–100)
VT: 360 mL
pCO2 arterial: 37.1 mmHg (ref 35.0–45.0)
pO2, Arterial: 128 mmHg — ABNORMAL HIGH (ref 80.0–100.0)

## 2015-06-29 LAB — CBC WITH DIFFERENTIAL/PLATELET
BASOS ABS: 0 10*3/uL (ref 0.0–0.1)
BLASTS: 0 %
Band Neutrophils: 27 %
Basophils Relative: 0 %
Eosinophils Absolute: 0 10*3/uL (ref 0.0–0.7)
Eosinophils Relative: 0 %
HEMATOCRIT: 40.3 % (ref 36.0–46.0)
HEMOGLOBIN: 12.9 g/dL (ref 12.0–15.0)
Lymphocytes Relative: 2 %
Lymphs Abs: 0.7 10*3/uL (ref 0.7–4.0)
MCH: 29.9 pg (ref 26.0–34.0)
MCHC: 32 g/dL (ref 30.0–36.0)
MCV: 93.5 fL (ref 78.0–100.0)
METAMYELOCYTES PCT: 5 %
MYELOCYTES: 1 %
Monocytes Absolute: 1.1 10*3/uL — ABNORMAL HIGH (ref 0.1–1.0)
Monocytes Relative: 3 %
NEUTROS PCT: 62 %
NRBC: 0 /100{WBCs}
Neutro Abs: 33.7 10*3/uL — ABNORMAL HIGH (ref 1.7–7.7)
Other: 0 %
PROMYELOCYTES ABS: 0 %
Platelets: 226 10*3/uL (ref 150–400)
RBC: 4.31 MIL/uL (ref 3.87–5.11)
RDW: 15.1 % (ref 11.5–15.5)
WBC: 35.5 10*3/uL — AB (ref 4.0–10.5)

## 2015-06-29 LAB — BASIC METABOLIC PANEL
Anion gap: 19 — ABNORMAL HIGH (ref 5–15)
BUN: 62 mg/dL — AB (ref 6–20)
CHLORIDE: 99 mmol/L — AB (ref 101–111)
CO2: 16 mmol/L — ABNORMAL LOW (ref 22–32)
CREATININE: 4.01 mg/dL — AB (ref 0.44–1.00)
Calcium: 8 mg/dL — ABNORMAL LOW (ref 8.9–10.3)
GFR, EST AFRICAN AMERICAN: 11 mL/min — AB (ref >60)
GFR, EST NON AFRICAN AMERICAN: 9 mL/min — AB (ref >60)
Glucose, Bld: 197 mg/dL — ABNORMAL HIGH (ref 65–99)
Potassium: 4.5 mmol/L (ref 3.5–5.1)
SODIUM: 134 mmol/L — AB (ref 135–145)

## 2015-06-29 LAB — LACTIC ACID, PLASMA: LACTIC ACID, VENOUS: 8.9 mmol/L — AB (ref 0.5–2.0)

## 2015-06-29 LAB — GLUCOSE, CAPILLARY: Glucose-Capillary: 141 mg/dL — ABNORMAL HIGH (ref 65–99)

## 2015-06-29 LAB — CARBOXYHEMOGLOBIN
CARBOXYHEMOGLOBIN: 0.6 % (ref 0.5–1.5)
METHEMOGLOBIN: 1 % (ref 0.0–1.5)
O2 Saturation: 50.9 %
Total hemoglobin: 12.5 g/dL (ref 12.0–16.0)

## 2015-06-29 LAB — MRSA PCR SCREENING: MRSA by PCR: NEGATIVE

## 2015-06-29 LAB — VANCOMYCIN, RANDOM: Vancomycin Rm: 28 ug/mL

## 2015-06-29 MED ORDER — ACETAMINOPHEN 325 MG PO TABS: 650.0000 mg | ORAL_TABLET | Freq: Four times a day (QID) | ORAL | Status: DC | PRN

## 2015-06-29 MED ORDER — DOBUTAMINE IN D5W 4-5 MG/ML-% IV SOLN
2.5000 ug/kg/min | INTRAVENOUS | Status: DC
  Administered 2015-06-29: 2.5 ug/kg/min via INTRAVENOUS
  Filled 2015-06-29: qty 250

## 2015-06-29 MED ORDER — NOREPINEPHRINE BITARTRATE 1 MG/ML IV SOLN
2.0000 ug/min | INTRAVENOUS | Status: DC
  Administered 2015-06-29: 28 ug/min via INTRAVENOUS
  Filled 2015-06-29: qty 16

## 2015-06-30 LAB — CULTURE, BLOOD (ROUTINE X 2)
CULTURE: NO GROWTH
Culture: NO GROWTH

## 2015-06-30 LAB — GLUCOSE, CAPILLARY: Glucose-Capillary: 146 mg/dL — ABNORMAL HIGH (ref 65–99)

## 2015-07-01 ENCOUNTER — Telehealth: Payer: Self-pay

## 2015-07-01 NOTE — Telephone Encounter (Signed)
On 07/01/2015 I received a death certificate from [place]. The death certificate is for burial. The patient is a patient of Doctor McQuaid. The death certificate will be taken to the pulmonary unit at [facility] this pm for signature. On 07-17-2015 I received the death certificate back from Doctor Lake Bells, I got the death certificate ready for pickup and called the funeral home to let them know the death certificate is ready for pickup.

## 2015-07-15 NOTE — Progress Notes (Signed)
Chaplain paged at the death of [name]. Family gathered and was comforted. Family is in shock how quickly [name] declined in health and died. Dr Lake Bells provided grief support and assistance in dealing with the loss of their loved one. Grief counsel and support Provided.  Sallee Lange. Pearley Millington, [place]

## 2015-07-15 NOTE — Progress Notes (Signed)
Vent has been moved from room.

## 2015-07-15 NOTE — Progress Notes (Signed)
eLink Physician-Brief Progress Note Patient Name: [name] DOB: [birth year] MRN: [id number]   Date of Service  2015/07/22  HPI/Events of Note  Notified by RT of ABG pH 7.1. Rate on vent 25.  eICU Interventions  Increase vent rate to 30. Increase bicarb gtt rate to 150cc/hr.     Intervention Category Major Interventions: Acid-Base disturbance - evaluation and management  [name] 2015/07/22, 12:50 AM

## 2015-07-15 NOTE — Procedures (Signed)
Extubation Procedure Note  Patient Details:   Name: Refugia Laneve DOB: 1927-08-20 MRN: [id number]   Airway Documentation:  Airway 7.5 mm (Active)  Secured at (cm) 23 cm 07-13-2015  7:47 AM  Measured From Lips 13-Jul-2015  7:47 AM  [place] 2015/07/13  7:47 AM  Secured By [employer] July 13, 2015  7:47 AM  Tube Holder Repositioned Yes 2015-07-13  7:47 AM  Cuff Pressure (cm H2O) 24 cm H2O 2015/07/13  7:47 AM  Site Condition Dry July 13, 2015  7:47 AM    Evaluation  O2 sats: transiently fell during during procedure Complications: No apparent complications Patient did not tolerate procedure well. Bilateral Breath Sounds: Diminished Suctioning: Airway No  Johnette Abraham 13-Jul-2015, 8:42 AM

## 2015-07-15 NOTE — Progress Notes (Signed)
eLink Physician-Brief Progress Note Patient Name: [name] DOB: Jun 08, 1927 MRN: [id number]   Date of Service  2015-07-11  HPI/Events of Note  RT notified of ongoing acidosis. Peak pressure reported ~31 on TV 360 & Rate 30.  eICU Interventions  Increase TV to 400cc & rate to 32. Continue bicarb gtt.     Intervention Category Major Interventions: Acid-Base disturbance - evaluation and management  [name] 07-11-15, 4:23 AM

## 2015-07-15 NOTE — Discharge Summary (Signed)
[name], [name]                   ACCOUNT NO.:  [id number]  MEDICAL RECORD NO.:  [id number]  LOCATION:  9794                         FACILITY:  [facility]  PHYSICIAN:  Norlene Campbell, MDDATE OF BIRTH:  04-15-1927  DATE OF ADMISSION:  07/03/2015 DATE OF DISCHARGE:  07/23/2015                              DISCHARGE SUMMARY   DEATH SUMMARY:  CAUSE OF DEATH: 1. Small bowel obstruction. 2. C. diff colitis. 3. Septic shock.  HISTORY OF PRESENT ILLNESS:  This was an 81 year old female, who was admitted on June 25, 2015, with abdominal pain and loose stools.  She was admitted to the [facility] and was diagnosed on admission with C diff colitis.  During her hospital stay, she was treated with IV fluids and antibiotics.  She was noted to be mildly hypoxemic on admission.  She was treated with oxygen therapy.  On June 28, 2015, her respiratory status worsened in the setting of increasing abdominal distention.  She was transferred to the intensive care unit and Pulmonary Critical Care Medicine was consulted.  HOSPITAL COURSE:  After admission to the intensive care unit, Pulmonary Critical Care Medicine saw her upon arrival.  She was noted to be in marked respiratory distress and was initially treated with BiPAP.  On physical exam, she had a markedly distended abdomen with no bowel sounds.  She did have tenderness to palpation on her belly.  She was intubated and a central line was placed and she was aggressively resuscitated for septic shock.  She was given aggressive fluid resuscitation with crystalloids, started on vasopressors.  Lab work revealed a severe metabolic acidosis which was treated with a bicarbonate drip.  A CT scan of her abdomen showed an ileus versus small bowel obstruction in the setting of colitis which was known to be due to her C. diff diagnosis.  She was treated aggressively throughout the course of the evening but her condition continued to  deteriorate overnight on June 28, 2015, through the early morning of July 23, 2015.  She was treated with increasing doses of vasopressors, inotropic therapy was added, and her respiratory support was maximized with the ventilator.  Despite all these measures, her shock continued to worsen. Early morning of 2015-07-23, she was unresponsive, auric with worsening multiorgan failure.  The family was updated as to her overall poor prognosis and she was made DNR after discussion with Dr. Lake Bells. Specifically, I advised that CPR would only cause harm, pain, and suffering and would not change her outcome considering her multiorgan failure, untreatable underlying condition with severe metabolic acidosis likely due to dead bowel.  The family was advised to come into the hospital but unfortunately the patient died prior to their arrival.  Time of death was 8:35 a.m. on 2015/07/23.          ______________________________ Norlene Campbell, MD     DBM/MEDQ  D:  Jul 23, 2015  T:  07/23/15  Job:  [id number]

## 2015-07-15 NOTE — Progress Notes (Signed)
eLink Physician-Brief Progress Note Patient Name: [name] DOB: Jun 01, 1927 MRN: [id number]   Date of Service  07/17/2015  HPI/Events of Note  Co-Ox 50.  eICU Interventions  Dobutamine gtt. Repeat Co-Ox at 9am.     Intervention Category Major Interventions: Sepsis - evaluation and management  [name] 07-17-15, 5:57 AM

## 2015-07-15 NOTE — Progress Notes (Signed)
Pt expired @ 0835. Dr. Lake Bells at bedside. Pt's sister, Annamary Carolin, notified by phone. CDS notified, pt is not a potential donor. Pt's sister, Jacqlyn Larsen, arrived to hospital after pt died. Chaplain called to bedside for emotional support. All questions and concerns addressed and emotional support given to pt's family.  Dorrene German, RN

## 2015-07-15 NOTE — Progress Notes (Signed)
eLink Physician-Brief Progress Note Patient Name: [name] DOB: 02-16-1927 MRN: [id number]   Date of Service  July 10, 2015  HPI/Events of Note  RN notified LA still 8. On levo @ 20 w/ bicarb gtt. CVP 10-12.  eICU Interventions  Check ABG now. Hold on further fluid bolus.     Intervention Category Major Interventions: Acute renal failure - evaluation and management  [name] 2015/07/10, 12:20 AM

## 2015-07-15 NOTE — Progress Notes (Signed)
2 full 250cc bags of 2533mcg in 262mL Fentanyl gtt wasted w/ Jacklynn Lewis, RN.   Dorrene German, RN

## 2015-07-15 NOTE — Progress Notes (Signed)
Pt has expired and was extubated per MD order. RN and MD at bedside.

## 2015-07-15 NOTE — Progress Notes (Signed)
[facility]  S: Worsening shock overnight, worsening acidosis, started on dobutamine for low SPO2  Objective Filed Vitals:   2015-07-03 0700 2015-07-03 0715 07/03/15 0730 03-Jul-2015 0745  BP: 100/62 113/36 101/45 101/76  Pulse:      Temp: 99.7 F (37.6 C) 99.7 F (37.6 C) 99.5 F (37.5 C) 99.5 F (37.5 C)  TempSrc:      Resp: 31 32 32 32  Height:      Weight:      SpO2:       Physical exam Gen. ashen, on ventilator HEENT endotracheal tube in place, cyanotic mucous membranes Pulmonary clear to auscultation, ventilator supported breaths Cardiovascular weak pulses GI distended, no bowel sounds Dermatologic no rash or skin breakdown Neurologic obtunded, no response to external stimuli  CT scan of the abdomen reviewed showing dilated loops of bowel likely consistent with a small bowel obstruction versus ileus, small volume fluid in the pelvis, colitis noted  CBC    Component Value Date/Time   WBC 35.5* Jul 03, 2015 0300   WBC 7.0 06/04/2015   WBC 6.1 11/17/2007 1337   RBC 4.31 2015/07/03 0300   RBC 3.73* 12/24/2011 1057   RBC 4.16 11/17/2007 1337   HGB 12.9 07/03/15 0300   HGB 13.1 11/17/2007 1337   HCT 40.3 03-Jul-2015 0300   HCT 37.5 11/17/2007 1337   PLT 226 07/03/2015 0300   PLT 280 11/17/2007 1337   MCV 93.5 03-Jul-2015 0300   MCV 90.1 11/17/2007 1337   MCH 29.9 07-03-15 0300   MCH 31.4 11/17/2007 1337   MCHC 32.0 03-Jul-2015 0300   MCHC 34.9 11/17/2007 1337   RDW 15.1 07-03-15 0300   RDW 14.1 11/17/2007 1337   LYMPHSABS 0.7 2015-07-03 0300   LYMPHSABS 1.5 11/17/2007 1337   MONOABS 1.1* Jul 03, 2015 0300   MONOABS 0.8 11/17/2007 1337   EOSABS 0.0 07/03/15 0300   EOSABS 0.1 11/17/2007 1337   BASOSABS 0.0 07/03/2015 0300   BASOSABS 0.0 11/17/2007 1337     BMET    Component Value Date/Time   NA 134* 07/03/15 0300   NA 139 06/04/2015   K 4.5 07/03/15 0300   CL 99* 2015-07-03 0300   CO2 16* 07/03/15 0300   GLUCOSE  197* July 03, 2015 0300   BUN 62* July 03, 2015 0300   BUN 16 06/04/2015   CREATININE 4.01* 07/03/2015 0300   CREATININE 0.9 06/04/2015   CALCIUM 8.0* July 03, 2015 0300   GFRNONAA 9* Jul 03, 2015 0300   GFRAA 11* July 03, 2015 0300    Lactic acid 8 O2 saturation from central venous line in the SVC :50  Impression: Principal Problem:   Sepsis ([place]) Active Problems:   Colitis   Leukocytosis   Acute respiratory failure with hypoxia (HCC)   Benign essential HTN   Diarrhea   C. difficile colitis   Hypomagnesemia   Normocytic anemia   Contrast dye induced nephropathy   Acute respiratory failure with hypoxemia (HCC)   AKI (acute kidney injury) ([place])   Septic shock ([place])  Discussion: Mrs. Cobbs condition has deteriorated overnight. She has a small bowel obstruction with likely dead bowel and an ileus. She has progressive metabolic acidosis, worsening shock, she is an uric and has worsening renal failure. Further, her mental status has deteriorated. She has multi-over an failure and will not survive this illness.  Plan: I updated the patient's sister on the phone this morning and let her know that her sister is actively dying. I advised that we could not perform CPR because this  would not improve her out, and would only cause pain and suffering. I explained that this would not help her situation. The sister voiced understanding. I advised that driving to the hospital carefully.  My critical care time this morning is 33 minutes  Roselie Awkward, MD [facility] Pager: [phone] Cell: [phone] After 3pm or if no response, call [phone]

## 2015-07-15 DEATH — deceased
